# Patient Record
Sex: Female | Born: 1970 | Race: White | Hispanic: No | Marital: Married | Smoking: Former smoker
Health system: Southern US, Community
[De-identification: ages and names within clinical notes are randomized; demographics above are authoritative.]

## PROBLEM LIST (undated history)

## (undated) DIAGNOSIS — K633 Ulcer of intestine: Secondary | ICD-10-CM

## (undated) DIAGNOSIS — M545 Low back pain, unspecified: Secondary | ICD-10-CM

## (undated) DIAGNOSIS — R0609 Other forms of dyspnea: Secondary | ICD-10-CM

## (undated) DIAGNOSIS — D751 Secondary polycythemia: Secondary | ICD-10-CM

## (undated) DIAGNOSIS — J383 Other diseases of vocal cords: Secondary | ICD-10-CM

## (undated) DIAGNOSIS — K4090 Unilateral inguinal hernia, without obstruction or gangrene, not specified as recurrent: Secondary | ICD-10-CM

## (undated) DIAGNOSIS — I10 Essential (primary) hypertension: Secondary | ICD-10-CM

## (undated) DIAGNOSIS — J45909 Unspecified asthma, uncomplicated: Secondary | ICD-10-CM

## (undated) DIAGNOSIS — R0600 Dyspnea, unspecified: Secondary | ICD-10-CM

## (undated) DIAGNOSIS — K5790 Diverticulosis of intestine, part unspecified, without perforation or abscess without bleeding: Secondary | ICD-10-CM

## (undated) DIAGNOSIS — J439 Emphysema, unspecified: Secondary | ICD-10-CM

## (undated) DIAGNOSIS — J189 Pneumonia, unspecified organism: Secondary | ICD-10-CM

## (undated) DIAGNOSIS — R05 Cough: Secondary | ICD-10-CM

## (undated) DIAGNOSIS — K7689 Other specified diseases of liver: Secondary | ICD-10-CM

## (undated) DIAGNOSIS — Z8719 Personal history of other diseases of the digestive system: Secondary | ICD-10-CM

## (undated) DIAGNOSIS — C44300 Unspecified malignant neoplasm of skin of unspecified part of face: Secondary | ICD-10-CM

## (undated) DIAGNOSIS — F319 Bipolar disorder, unspecified: Secondary | ICD-10-CM

## (undated) DIAGNOSIS — R059 Cough, unspecified: Secondary | ICD-10-CM

## (undated) DIAGNOSIS — I509 Heart failure, unspecified: Secondary | ICD-10-CM

## (undated) DIAGNOSIS — R062 Wheezing: Secondary | ICD-10-CM

## (undated) DIAGNOSIS — K219 Gastro-esophageal reflux disease without esophagitis: Secondary | ICD-10-CM

## (undated) DIAGNOSIS — J42 Unspecified chronic bronchitis: Secondary | ICD-10-CM

## (undated) DIAGNOSIS — G8929 Other chronic pain: Secondary | ICD-10-CM

## (undated) HISTORY — DX: Cough, unspecified: R05.9

## (undated) HISTORY — DX: Emphysema, unspecified: J43.9

## (undated) HISTORY — DX: Unspecified asthma, uncomplicated: J45.909

## (undated) HISTORY — PX: SKIN CANCER EXCISION: SHX779

## (undated) HISTORY — DX: Cough: R05

## (undated) HISTORY — PX: BACK SURGERY: SHX140

## (undated) HISTORY — DX: Wheezing: R06.2

---

## 1999-04-30 HISTORY — PX: APPENDECTOMY: SHX54

## 2004-12-09 ENCOUNTER — Ambulatory Visit: Payer: Self-pay | Admitting: Family Medicine

## 2004-12-21 ENCOUNTER — Ambulatory Visit: Payer: Self-pay | Admitting: Family Medicine

## 2005-01-04 ENCOUNTER — Ambulatory Visit: Payer: Self-pay | Admitting: Family Medicine

## 2005-01-21 ENCOUNTER — Ambulatory Visit: Payer: Self-pay | Admitting: Family Medicine

## 2005-04-21 ENCOUNTER — Ambulatory Visit: Payer: Self-pay | Admitting: Family Medicine

## 2005-06-21 ENCOUNTER — Ambulatory Visit (HOSPITAL_COMMUNITY): Admission: RE | Admit: 2005-06-21 | Discharge: 2005-06-21 | Payer: Self-pay | Admitting: Family Medicine

## 2005-06-21 ENCOUNTER — Ambulatory Visit: Payer: Self-pay | Admitting: Family Medicine

## 2005-06-22 ENCOUNTER — Ambulatory Visit: Payer: Self-pay | Admitting: Family Medicine

## 2005-08-11 ENCOUNTER — Ambulatory Visit: Payer: Self-pay | Admitting: Family Medicine

## 2005-08-29 DIAGNOSIS — J189 Pneumonia, unspecified organism: Secondary | ICD-10-CM

## 2005-08-29 HISTORY — DX: Pneumonia, unspecified organism: J18.9

## 2005-09-01 ENCOUNTER — Encounter: Admission: RE | Admit: 2005-09-01 | Discharge: 2005-09-01 | Payer: Self-pay | Admitting: Orthopedic Surgery

## 2005-09-27 ENCOUNTER — Ambulatory Visit: Payer: Self-pay | Admitting: Family Medicine

## 2005-09-28 ENCOUNTER — Ambulatory Visit (HOSPITAL_COMMUNITY): Admission: RE | Admit: 2005-09-28 | Discharge: 2005-09-28 | Payer: Self-pay | Admitting: Family Medicine

## 2005-09-30 ENCOUNTER — Inpatient Hospital Stay (HOSPITAL_COMMUNITY): Admission: EM | Admit: 2005-09-30 | Discharge: 2005-10-04 | Payer: Self-pay | Admitting: Emergency Medicine

## 2005-09-30 ENCOUNTER — Ambulatory Visit: Payer: Self-pay | Admitting: Family Medicine

## 2005-10-10 ENCOUNTER — Ambulatory Visit: Payer: Self-pay | Admitting: Family Medicine

## 2005-10-17 ENCOUNTER — Ambulatory Visit: Payer: Self-pay | Admitting: Family Medicine

## 2005-11-01 ENCOUNTER — Ambulatory Visit: Payer: Self-pay | Admitting: Family Medicine

## 2005-12-13 ENCOUNTER — Ambulatory Visit: Payer: Self-pay | Admitting: Family Medicine

## 2005-12-30 ENCOUNTER — Ambulatory Visit: Payer: Self-pay | Admitting: Family Medicine

## 2006-01-12 ENCOUNTER — Ambulatory Visit: Payer: Self-pay | Admitting: Family Medicine

## 2006-10-03 ENCOUNTER — Inpatient Hospital Stay (HOSPITAL_COMMUNITY): Admission: RE | Admit: 2006-10-03 | Discharge: 2006-10-05 | Payer: Self-pay | Admitting: Obstetrics and Gynecology

## 2006-10-03 ENCOUNTER — Encounter (INDEPENDENT_AMBULATORY_CARE_PROVIDER_SITE_OTHER): Payer: Self-pay | Admitting: *Deleted

## 2006-10-28 HISTORY — PX: ABDOMINAL HYSTERECTOMY: SHX81

## 2007-05-30 HISTORY — PX: CHOLECYSTECTOMY: SHX55

## 2007-08-01 ENCOUNTER — Emergency Department (HOSPITAL_COMMUNITY): Admission: EM | Admit: 2007-08-01 | Discharge: 2007-08-02 | Payer: Self-pay | Admitting: Emergency Medicine

## 2007-08-02 ENCOUNTER — Encounter: Payer: Self-pay | Admitting: Internal Medicine

## 2007-08-06 ENCOUNTER — Encounter: Payer: Self-pay | Admitting: Internal Medicine

## 2007-08-06 ENCOUNTER — Emergency Department (HOSPITAL_COMMUNITY): Admission: EM | Admit: 2007-08-06 | Discharge: 2007-08-06 | Payer: Self-pay | Admitting: Emergency Medicine

## 2008-03-29 HISTORY — PX: HERNIA REPAIR: SHX51

## 2009-03-08 ENCOUNTER — Emergency Department (HOSPITAL_COMMUNITY): Admission: EM | Admit: 2009-03-08 | Discharge: 2009-03-08 | Payer: Self-pay | Admitting: Emergency Medicine

## 2009-05-13 ENCOUNTER — Emergency Department (HOSPITAL_COMMUNITY): Admission: EM | Admit: 2009-05-13 | Discharge: 2009-05-13 | Payer: Self-pay | Admitting: Emergency Medicine

## 2009-08-05 ENCOUNTER — Ambulatory Visit (HOSPITAL_COMMUNITY): Admission: RE | Admit: 2009-08-05 | Discharge: 2009-08-05 | Payer: Self-pay | Admitting: Family Medicine

## 2009-08-05 ENCOUNTER — Encounter: Payer: Self-pay | Admitting: Internal Medicine

## 2009-08-27 ENCOUNTER — Ambulatory Visit (HOSPITAL_COMMUNITY): Admission: RE | Admit: 2009-08-27 | Discharge: 2009-08-27 | Payer: Self-pay | Admitting: Family Medicine

## 2009-08-27 ENCOUNTER — Encounter: Payer: Self-pay | Admitting: Internal Medicine

## 2009-09-21 ENCOUNTER — Encounter (INDEPENDENT_AMBULATORY_CARE_PROVIDER_SITE_OTHER): Payer: Self-pay | Admitting: *Deleted

## 2009-10-08 ENCOUNTER — Encounter (INDEPENDENT_AMBULATORY_CARE_PROVIDER_SITE_OTHER): Payer: Self-pay | Admitting: *Deleted

## 2009-11-04 ENCOUNTER — Telehealth: Payer: Self-pay | Admitting: Internal Medicine

## 2009-11-05 ENCOUNTER — Ambulatory Visit (HOSPITAL_COMMUNITY): Admission: RE | Admit: 2009-11-05 | Discharge: 2009-11-05 | Payer: Self-pay | Admitting: Family Medicine

## 2009-12-07 ENCOUNTER — Emergency Department (HOSPITAL_COMMUNITY)
Admission: EM | Admit: 2009-12-07 | Discharge: 2009-12-07 | Payer: Self-pay | Source: Home / Self Care | Admitting: Emergency Medicine

## 2009-12-10 ENCOUNTER — Encounter: Payer: Self-pay | Admitting: Internal Medicine

## 2009-12-11 ENCOUNTER — Telehealth (INDEPENDENT_AMBULATORY_CARE_PROVIDER_SITE_OTHER): Payer: Self-pay | Admitting: *Deleted

## 2009-12-17 ENCOUNTER — Encounter: Payer: Self-pay | Admitting: Pulmonary Disease

## 2010-01-08 ENCOUNTER — Telehealth (INDEPENDENT_AMBULATORY_CARE_PROVIDER_SITE_OTHER): Payer: Self-pay | Admitting: *Deleted

## 2010-01-11 ENCOUNTER — Telehealth (INDEPENDENT_AMBULATORY_CARE_PROVIDER_SITE_OTHER): Payer: Self-pay | Admitting: *Deleted

## 2010-01-29 ENCOUNTER — Ambulatory Visit: Payer: Self-pay | Admitting: Pulmonary Disease

## 2010-01-29 DIAGNOSIS — I10 Essential (primary) hypertension: Secondary | ICD-10-CM | POA: Insufficient documentation

## 2010-01-29 DIAGNOSIS — Z8659 Personal history of other mental and behavioral disorders: Secondary | ICD-10-CM

## 2010-01-29 DIAGNOSIS — J449 Chronic obstructive pulmonary disease, unspecified: Secondary | ICD-10-CM

## 2010-01-29 DIAGNOSIS — E119 Type 2 diabetes mellitus without complications: Secondary | ICD-10-CM

## 2010-02-23 ENCOUNTER — Telehealth (INDEPENDENT_AMBULATORY_CARE_PROVIDER_SITE_OTHER): Payer: Self-pay | Admitting: *Deleted

## 2010-02-25 ENCOUNTER — Ambulatory Visit: Payer: Self-pay | Admitting: Pulmonary Disease

## 2010-02-26 ENCOUNTER — Telehealth: Payer: Self-pay | Admitting: Pulmonary Disease

## 2010-05-19 ENCOUNTER — Ambulatory Visit (HOSPITAL_COMMUNITY)
Admission: RE | Admit: 2010-05-19 | Discharge: 2010-05-19 | Payer: Self-pay | Source: Home / Self Care | Admitting: Internal Medicine

## 2010-08-29 HISTORY — PX: BILATERAL OOPHORECTOMY: SHX1221

## 2010-09-01 ENCOUNTER — Inpatient Hospital Stay (HOSPITAL_COMMUNITY)
Admission: RE | Admit: 2010-09-01 | Discharge: 2010-09-03 | Payer: Self-pay | Source: Home / Self Care | Attending: Obstetrics and Gynecology | Admitting: Obstetrics and Gynecology

## 2010-09-01 ENCOUNTER — Encounter (HOSPITAL_COMMUNITY): Payer: Self-pay | Admitting: Obstetrics and Gynecology

## 2010-09-01 LAB — TYPE AND SCREEN
ABO/RH(D): A POS
Antibody Screen: NEGATIVE

## 2010-09-01 LAB — GLUCOSE, CAPILLARY
Glucose-Capillary: 116 mg/dL — ABNORMAL HIGH (ref 70–99)
Glucose-Capillary: 157 mg/dL — ABNORMAL HIGH (ref 70–99)
Glucose-Capillary: 157 mg/dL — ABNORMAL HIGH (ref 70–99)
Glucose-Capillary: 141 mg/dL — ABNORMAL HIGH (ref 70–99)

## 2010-09-02 LAB — CBC
HCT: 40.3 % (ref 36.0–46.0)
Hemoglobin: 12.8 g/dL (ref 12.0–15.0)
MCH: 29.7 pg (ref 26.0–34.0)
MCHC: 31.8 g/dL (ref 30.0–36.0)
MCV: 93.5 fL (ref 78.0–100.0)
Platelets: 370 10*3/uL (ref 150–400)
RBC: 4.31 MIL/uL (ref 3.87–5.11)
RDW: 13.7 % (ref 11.5–15.5)
WBC: 14.7 10*3/uL — ABNORMAL HIGH (ref 4.0–10.5)

## 2010-09-02 LAB — COMPREHENSIVE METABOLIC PANEL
ALT: 10 U/L (ref 0–35)
AST: 13 U/L (ref 0–37)
Albumin: 3.2 g/dL — ABNORMAL LOW (ref 3.5–5.2)
Alkaline Phosphatase: 35 U/L — ABNORMAL LOW (ref 39–117)
BUN: 8 mg/dL (ref 6–23)
CO2: 31 mEq/L (ref 19–32)
Calcium: 8.9 mg/dL (ref 8.4–10.5)
Chloride: 107 mEq/L (ref 96–112)
Creatinine, Ser: 0.61 mg/dL (ref 0.4–1.2)
GFR calc Af Amer: >60 mL/min (ref >60)
GFR calc non Af Amer: >60 mL/min (ref >60)
Glucose, Bld: 104 mg/dL — ABNORMAL HIGH (ref 70–99)
Potassium: 4.9 mEq/L (ref 3.5–5.1)
Sodium: 140 mEq/L (ref 135–145)
Total Bilirubin: 0.7 mg/dL (ref 0.3–1.2)
Total Protein: 5.6 g/dL — ABNORMAL LOW (ref 6.0–8.3)

## 2010-09-02 LAB — GLUCOSE, CAPILLARY
Glucose-Capillary: 94 mg/dL (ref 70–99)
Glucose-Capillary: 111 mg/dL — ABNORMAL HIGH (ref 70–99)
Glucose-Capillary: 98 mg/dL (ref 70–99)

## 2010-09-03 LAB — GLUCOSE, CAPILLARY: Glucose-Capillary: 87 mg/dL (ref 70–99)

## 2010-09-19 ENCOUNTER — Encounter: Payer: Self-pay | Admitting: Family Medicine

## 2010-09-19 ENCOUNTER — Encounter: Payer: Self-pay | Admitting: Internal Medicine

## 2010-09-20 ENCOUNTER — Encounter: Payer: Self-pay | Admitting: Family Medicine

## 2010-09-28 NOTE — Letter (Signed)
Summary: New Patient letter  [facility]  [address] [place], [place] [address]   Phone: [phone]  Fax: [phone]       09/21/2009 MRN: [id number]  [facility] [address] [place], [place]  [address]  Dear Ms. Pinkerton,  Welcome to the Gastroenterology Division at [facility].    You are scheduled to see Dr.  Marina Goodell on 10-08-09 at 10am on the 3rd floor at [facility], [address]  We ask that you try to arrive at our office 15 minutes prior to your appointment time to allow for check-in.  We would like you to complete the enclosed self-administered evaluation form prior to your visit and bring it with you on the day of your appointment.  We will review it with you.  Also, please bring a complete list of all your medications or, if you prefer, bring the medication bottles and we will list them.  Please bring your insurance card so that we may make a copy of it.  If your insurance requires a referral to see a specialist, please bring your referral form from your primary care physician.  Co-payments are due at the time of your visit and may be paid by cash, check or credit card.     Your office visit will consist of a consult with your physician (includes a physical exam), any laboratory testing he/she may order, scheduling of any necessary diagnostic testing (e.g. x-ray, ultrasound, CT-scan), and scheduling of a procedure (e.g. Endoscopy, Colonoscopy) if required.  Please allow enough time on your schedule to allow for any/all of these possibilities.    If you cannot keep your appointment, please call [phone] to cancel or reschedule prior to your appointment date.  This allows [place] the opportunity to schedule an appointment for another patient in need of care.  If you do not cancel or reschedule by 5 p.m. the business day prior to your appointment date, you will be charged a $50.00 late cancellation/no-show fee.    Thank you for choosing [facility] for your medical needs.  We appreciate the opportunity to care for you.  Please visit [place] at our website  to learn more about our practice.                     Sincerely,                                                             The Gastroenterology Division

## 2010-09-28 NOTE — Progress Notes (Signed)
Summary: R/S appt. today  ---- Converted from flag ---- ---- 11/04/2009 9:21 AM, Hilarie Fredrickson MD wrote: convert this to phone note so that it is part of the record  ---- 11/04/2009 8:21 AM, Milford Cage NCMA wrote:   ---- 11/04/2009 8:20 AM, Karna Christmas wrote: Pt r/s until 22-Dec-2009 b/c of death in family ------------------------------  Phone Note Call from Patient   Caller: Patient Action Taken: Appt Scheduled Today Summary of Call: R/S appt. for today-death in family.

## 2010-09-28 NOTE — Letter (Signed)
Summary: Discharge Letter  [facility]  [address] [place], [place] [address]   Phone: [phone]  Fax: [phone]       12/10/2009 MRN: [id number]  [facility] [address] [place], [place]  [address]  Dear Ms. Zentner,   Unfortunately,I find it necessary to inform you that I will not be able to provide medical care to you, because you have had multiple same day cancellations as well as a no show on 12-10-2009 with [facility]. Needless to say, this behavior creates an unnecessary burden of work our Programme researcher, broadcasting/film/video as well as unfairness to other patients who may have wished to be seen in our office.  Since your condition requires medical attention, I suggest that you place yourself under the care of another physician without delay. If you desire, I will be available for emergency care only for 30 days after you receive this letter.  This should give you ample time to select a physician of your choice from the many competent providers in this area. You may want to call the local medical society or [facility]'s physician referral service [phone]) for their assistance in locating a new physician. With your written authorization, I will make a copy of your medical record available to your new physician.   Sincerely,    Wilhemina Bonito. Marina Goodell M.D. [facility]

## 2010-09-28 NOTE — Progress Notes (Signed)
Summary: Dismissal Letter Mailed 1st Class Mail  Letter undeliverable. Resent by first class mail. [name]  Jan 11, 2010 4:11 PM

## 2010-09-28 NOTE — Progress Notes (Signed)
Summary: Certified Letter Returned   Letter undeliverable. Resent by first class mail. [name]  Jan 08, 2010 8:43 AM

## 2010-09-28 NOTE — Letter (Signed)
Summary: New Patient letter  [facility]  [address] [place], [place] [address]   Phone: [phone]  Fax: [phone]       10/08/2009 MRN: [id number]  [facility] [address] [place], [place]  [address]  Dear Ms. Monter,  Welcome to the Gastroenterology Division at [facility].    You are scheduled to see Dr. Marina Goodell on 11-04-09 at 9:30a.m. on the 3rd floor at [facility], [address].  We ask that you try to arrive at our office 15 minutes prior to your appointment time to allow for check-in.  We would like you to complete the enclosed self-administered evaluation form prior to your visit and bring it with you on the day of your appointment.  We will review it with you.  Also, please bring a complete list of all your medications or, if you prefer, bring the medication bottles and we will list them.  Please bring your insurance card so that we may make a copy of it.  If your insurance requires a referral to see a specialist, please bring your referral form from your primary care physician.  Co-payments are due at the time of your visit and may be paid by cash, check or credit card.     Your office visit will consist of a consult with your physician (includes a physical exam), any laboratory testing he/she may order, scheduling of any necessary diagnostic testing (e.g. x-ray, ultrasound, CT-scan), and scheduling of a procedure (e.g. Endoscopy, Colonoscopy) if required.  Please allow enough time on your schedule to allow for any/all of these possibilities.    If you cannot keep your appointment, please call [phone] to cancel or reschedule prior to your appointment date.  This allows [place] the opportunity to schedule an appointment for another patient in need of care.  If you do not cancel or reschedule by 5 p.m. the business day prior to your appointment date, you will be charged a $50.00 late cancellation/no-show fee.    Thank you for choosing [facility] for your medical needs.  We appreciate the opportunity to care for you.  Please visit [place] at our website  to learn more about our practice.                     Sincerely,                                                             The Gastroenterology Division

## 2010-09-28 NOTE — Progress Notes (Signed)
  Phone Note Other Incoming   Request: Send information Summary of Call: Records received from [facility].  32 pages forwarded to [facility] for Dr. Shelle Iron to review.      Appended Document:  received records and put in [facility] very important look at folder for him to review.    Appended Document:  all of the records are related to psychiatric evaluations and admissions.  NO pulmonary records available.  Will neet AAT level next visit.

## 2010-09-28 NOTE — Assessment & Plan Note (Signed)
Summary: consult for copd   Copy to:  Silas Sacramento, FNP/ Dwana Melena Primary Provider/Referring Provider:  Silas Sacramento, FNP at [facility], MD  CC:  Pulmonary Consult.  History of Present Illness: the patient is a 40 year old female who I've been asked to see for chronic obstructive pulmonary disease. The patient apparently has been diagnosed with obstructive disease at [facility] many years ago, and has seen a significant worsening of her breathing over the last few years. She has a long history of smoking, and fortunately continues to smoke up to 2 packs of cigarettes a day. She currently is on Spiriva, DuoNeb neb 4 times a day, and also Brovana twice a day. She tells me that she uses her rescue inhaler constantly. The patient last had an episode of acute exacerbation 2 months ago. She was treated with antibiotics and prednisone, and despite this she continues to have shortness of breath and oxygen saturations below her usual baseline. She was treated with a number course of prednisone, and feels that she has gotten a little better. She describes dyspnea on exertion just walking through her house, as well as bringing groceries in from the car. She has a chronic cough with intermittent production of white mucus, and also has intermittent lower extremity edema. She has had a recent chest x-ray that apparently showed no acute process.  Preventive Screening-Counseling & Management  Alcohol-Tobacco     Smoking Status: current  Current Medications (verified): 1)  Percocet 5-325 Mg Tabs (Oxycodone-Acetaminophen) .Marland Kitchen.. 1 Tab By Mouth Every 4 To 6 Hours As Needed 2)  Promethazine Hcl 25 Mg Tabs (Promethazine Hcl) .... Take 1 Tablet By Mouth Three Times A Day 3)  Combivent 18-103 Mcg/act Aero (Ipratropium-Albuterol) .... 2 Puffs Four Times A Day As Needed 4)  Spiriva Handihaler 18 Mcg Caps (Tiotropium Bromide Monohydrate) .... Inhale Contents of 1 Capsule Once A Day 5)  Hydrochlorothiazide 25  Mg Tabs (Hydrochlorothiazide) .... Take 1 Tablet By Mouth Once A Day 6)  Metformin Hcl 500 Mg Tabs (Metformin Hcl) .... Take 1 Tablet By Mouth Two Times A Day 7)  Benicar 40 Mg Tabs (Olmesartan Medoxomil) .... Take 1 Tablet By Mouth Once A Day 8)  Duoneb 0.5-2.5 (3) Mg/56ml Soln (Ipratropium-Albuterol) .Marland Kitchen.. 1 Vial in Nebulizer Two Times A Day 9)  Brovana 15 Mcg/79ml Nebu (Arformoterol Tartrate) .Marland Kitchen.. 1 Vial in Nebulizer Two Times A Day 10)  Ventolin Hfa 108 (90 Base) Mcg/act  Aers (Albuterol Sulfate) .Marland Kitchen.. 1-2 Puffs Every 4-6 Hours As Needed  Allergies (verified): No Known Drug Allergies  Past History:  Past Medical History:  BIPOLAR AFFECTIVE DISORDER, HX OF (ICD-V11.8) HYPERTENSION (ICD-401.9) DIABETES MELLITUS (ICD-250.00)    Past Surgical History: hernia repair x 2 gallbladder 2008 lung biopsies x 2 at [facility] for unknown reasons hysterectomy 2008 appendectomy 2009  Family History: Reviewed history and no changes required. emphysema: maternal grandfather, paternal grandfather asthma: maternal grandfather, paternal grandfather cancer: mother (skin, colon)   Social History: Reviewed history and no changes required. Patient is a current smoker.   started at age 1.  1 1/2 to 2 ppd.  pt has children. pt lives with Thurnell Lose. Pt is currently on SS Disability.  Smoking Status:  current  Review of Systems       The patient complains of shortness of breath with activity, shortness of breath at rest, productive cough, non-productive cough, chest pain, abdominal pain, sore throat, and change in color of mucus.  The patient denies coughing up blood, irregular  heartbeats, acid heartburn, indigestion, loss of appetite, weight change, difficulty swallowing, tooth/dental problems, headaches, nasal congestion/difficulty breathing through nose, sneezing, itching, ear ache, anxiety, depression, hand/feet swelling, joint stiffness or pain, rash, and fever.    Vital Signs:  Patient profile:    40 year old female Height:      65 inches Weight:      195 pounds BMI:     32.57 O2 Sat:      93 % on Room air Temp:     98.0 degrees F oral Pulse rate:   89 / minute Cuff size:   regular  Vitals Entered By: Arman Filter LPN (January 29, 1609 3:24 PM)  O2 Flow:  Room air CC: Pulmonary Consult Comments Medications reviewed with patient Arman Filter LPN  January 30, 9603 3:24 PM     Physical Exam  General:  wd female in nad Eyes:  PERRLA and EOMI.   Nose:  patent without discharge Mouth:  mild thrush, inflammation posteriorly no lesions seen Neck:  no jvd, LN ?thyroid enlargement? Lungs:  decreased bs, but surprisingly fairly clear no wheezing or rhonchi Heart:  rrr, no mrg Abdomen:  soft and nontender, bs+ Extremities:  no significant edema or cyanosis pulses intact distally Neurologic:  alert and oriented, moves all 4.   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 I think she has at least chronic asthmatic bronchitis related to severe airway inflammation from her ongoing smoking. I would like to simplify her bronchodilator regimen, but also add inhaled corticosteroids in light of her frequent exacerbations. However, I have told her there is no medication available that will make her better if she continues to smoke to this degree. I would like to check full pulmonary function studies at the next visit after she has been on inhaled steroids, and have stressed to her the importance of total smoking cessation.  Medications Added to Medication List This Visit: 1)  Percocet 5-325 Mg Tabs (Oxycodone-acetaminophen) .Marland Kitchen.. 1 tab by mouth every 4 to 6 hours as needed 2)  Promethazine Hcl 25 Mg Tabs (Promethazine hcl) .... Take 1 tablet by mouth three times a day 3)  Combivent 18-103 Mcg/act Aero  (Ipratropium-albuterol) .... 2 puffs four times a day as needed 4)  Spiriva Handihaler 18 Mcg Caps (Tiotropium bromide monohydrate) .... Inhale contents of 1 capsule once a day 5)  Hydrochlorothiazide 25 Mg Tabs (Hydrochlorothiazide) .... Take 1 tablet by mouth once a day 6)  Metformin Hcl 500 Mg Tabs (Metformin hcl) .... Take 1 tablet by mouth two times a day 7)  Benicar 40 Mg Tabs (Olmesartan medoxomil) .... Take 1 tablet by mouth once a day 8)  Duoneb 0.5-2.5 (3) Mg/24ml Soln (Ipratropium-albuterol) .Marland Kitchen.. 1 vial in nebulizer two times a day 9)  Brovana 15 Mcg/77ml Nebu (Arformoterol tartrate) .Marland Kitchen.. 1 vial in nebulizer two times a day 10)  Ventolin Hfa 108 (90 Base) Mcg/act Aers (Albuterol sulfate) .Marland Kitchen.. 1-2 puffs every 4-6 hours as needed 11)  Pulmicort 0.5 Mg/9ml Susp (Budesonide) .... One in nebulizer am and pm  Other Orders: Consultation Level IV (54098) Pulmonary Referral (Pulmonary) Tobacco use cessation intermediate 3-10 minutes (11914)  Patient Instructions: 1)  stay on spiriva one inhalation once a day 2)  will change duoneb to albuterol by nebulizer 4-6 times a day, but at least 4 times everyday.  3)  add budesonide 0.5mg  to two of the albuterol treatments each day. 4)  stop brovana 5)  will setup for breathing studies in 4 weeks and see you back on same day. 6)  stop smoking.  Prescriptions: PULMICORT 0.5 MG/2ML SUSP (BUDESONIDE) one in nebulizer am and pm  #60 x 6   Entered and Authorized by:   Barbaraann Share MD   Signed by:   Barbaraann Share MD on 01/29/2010   Method used:   Print then Give to Patient   RxID:   [id number]

## 2010-09-28 NOTE — Miscellaneous (Signed)
Summary: Orders Update  Clinical Lists Changes  Orders: Added new Service order of No Show NS50 (NS50) - Signed 

## 2010-09-28 NOTE — Progress Notes (Signed)
Summary: Dismissal Letter Mailed Certified  Dismissal Letter sent by certified mail. [name]  December 11, 2009 9:29 AM

## 2010-09-28 NOTE — Progress Notes (Signed)
Summary: nos appt  Phone Note Call from Patient   Caller: [email or web address]  Call For: Yanky Vanderburg Summary of Call: LMTCB x2 to rsc nos from 6/30. Initial call taken by: Darletta Moll,  February 26, 2010 2:41 PM

## 2010-11-08 LAB — CBC
Hemoglobin: 15.7 g/dL — ABNORMAL HIGH (ref 12.0–15.0)
MCH: 30 pg (ref 26.0–34.0)
MCHC: 32.3 g/dL (ref 30.0–36.0)
RDW: 13.6 % (ref 11.5–15.5)

## 2010-11-08 LAB — SURGICAL PCR SCREEN
MRSA, PCR: NEGATIVE
Staphylococcus aureus: NEGATIVE

## 2010-11-08 LAB — BASIC METABOLIC PANEL
CO2: 28 mEq/L (ref 19–32)
Calcium: 9.1 mg/dL (ref 8.4–10.5)
Creatinine, Ser: 0.66 mg/dL (ref 0.4–1.2)
GFR calc Af Amer: >60 mL/min (ref >60)
GFR calc non Af Amer: >60 mL/min (ref >60)

## 2010-11-09 NOTE — Letter (Signed)
Summary: Dismissal Activation Form, Return Receipt  Dismissal Activation Form, Return Receipt   Imported By: Maryln Gottron 11/04/2010 14:28:04  _____________________________________________________________________  External Attachment:    Type:   Image     Comment:   External Document

## 2010-12-05 LAB — DIFFERENTIAL
Basophils Relative: 0 % (ref 0–1)
Lymphocytes Relative: 4 % — ABNORMAL LOW (ref 12–46)
Monocytes Relative: 2 % — ABNORMAL LOW (ref 3–12)
Neutro Abs: 23.8 10*3/uL — ABNORMAL HIGH (ref 1.7–7.7)

## 2010-12-05 LAB — COMPREHENSIVE METABOLIC PANEL
Albumin: 3.4 g/dL — ABNORMAL LOW (ref 3.5–5.2)
BUN: 23 mg/dL (ref 6–23)
Creatinine, Ser: 1.23 mg/dL — ABNORMAL HIGH (ref 0.4–1.2)
GFR calc Af Amer: 59 mL/min — ABNORMAL LOW (ref >60)
Potassium: 4.1 mEq/L (ref 3.5–5.1)
Total Protein: 6.5 g/dL (ref 6.0–8.3)

## 2010-12-05 LAB — CBC
HCT: 41.8 % (ref 36.0–46.0)
MCV: 91.8 fL (ref 78.0–100.0)
Platelets: 475 10*3/uL — ABNORMAL HIGH (ref 150–400)
RDW: 14.6 % (ref 11.5–15.5)

## 2010-12-05 LAB — RAPID URINE DRUG SCREEN, HOSP PERFORMED
Amphetamines: NOT DETECTED
Benzodiazepines: POSITIVE — AB
Cocaine: NOT DETECTED
Opiates: NOT DETECTED
Tetrahydrocannabinol: NOT DETECTED

## 2010-12-05 LAB — ETHANOL: Alcohol, Ethyl (B): <5 mg/dL (ref 0–10)

## 2011-01-14 NOTE — Discharge Summary (Signed)
Brenda Frank, Brenda Frank                 ACCOUNT NO.:  [id number]   MEDICAL RECORD NO.:  [id number]          PATIENT TYPE:  INP   LOCATION:  3027                         FACILITY:  [facility]   PHYSICIAN:  Rolm Gala, M.D.    DATE OF BIRTH:  1971/03/27   DATE OF ADMISSION:  09/30/2005  DATE OF DISCHARGE:  10/04/2005                                 DISCHARGE SUMMARY   DISCHARGE DIAGNOSES:  1.  Chronic obstructive pulmonary disease exacerbation.  2.  Pneumonia.  3.  Bipolar.  4.  Hypertension.  5.  Obesity.  6.  Tobacco abuse.  7.  Prediabetes.   CONSULTING PHYSICIANS:  None.   PROCEDURES:  None.   PRIMARY CARE PHYSICIAN:  [facility], [address] Dr.  Lennox Grumbles.   LABORATORY DATA:  The patient's white blood cells on admission 15.6, H&H  14.9/45.7, platelets 472, 87% PMNs, sodium 136, potassium 4.2, chloride 99,  bicarb 30, BUN 11, creatinine 0.7, glucose 121, AST 20, ALT 34, alk phos 86,  T-bili 0.5.  An ABG: 7.352/66/36.9/55.  A chest x-ray showed left lingular  pneumonia.  HBA1C on February 5 was 6.1.   HOSPITAL COURSE:  This is a 40 year old female, who came in with pneumonia  and likely a COPD exacerbation.  Problem #1.  COPD exacerbation.  The patient does not have a history of  COPD, but her chest x-ray looks like increased air space.  She has a long  smoking history and decreased breath sounds throughout.  The patient was  started on prednisone initially 60, but that was decreased to 40 and she was  discharged on a five-day course of prednisone.  The patient was also started  on Atrovent and albuterol here, but was discharged on her home DuoNeb.  Problem #2.  Pneumonia.  The patient came in and had been on azithromycin  that was continued here.  Her O2 saturation:  She remained on 2 L nasal  cannula at 91 to 95% off the nasal cannula.  She was initially 83%, but then  she came up to 86 and at discharge was walking around to bathroom and around  the  hallways without oxygen and not complaining of any shortness of breath.  Problem #3.  Bipolar.  The patient was continued on her home dose of Geodon,  lithium and Zoloft.  Problem #4.  Hypertension.  The patient came in on 5 mg of lisinopril and  this was increased to 10 mg due to elevated blood pressures.  Problem #5.  Tobacco abuse.  The patient was put on a nicotine patch and had  been cigarette free for five days.  She said she will try to continue this  path.  She received a smoking cessation consult.  Problem #6.  Obesity.  I advised the patient that it would be important to  her overall health to lose weight.  Problem #7.  Pre-diabetes.  The patient had a Hb A1c elevated at 6.1.  She  was referred to an outpatient diabetic education.  She was started on  Glucophage 500 p.o. b.i.d. a.c. and  she was advised that she is in the pre-  diabetic state and that if she did not lose weight and start exercising then  she would likely quickly progress to a full blown diabetic.  The patient  said she was aware that she may be pre-diabetic and had been concerned about  that and she had bought an elliptical machine to work towards losing weight.   DISCHARGE MEDICATIONS:  1.  Zoloft 100 mg daily.  2.  Advair 500/50 one puff b.i.d.  3.  Geodon 20 mg q.a.m. and 40 mg q.p.m.  4.  Lithium 600 mg p.o. b.i.d.  5.  DuoNeb inhale four times daily.  6.  Prednisone 40 mg p.o. daily for two more days.  7.  Lisinopril 10 mg daily.  8.  Azithromycin 250 mg daily until February 8.  9.  Glucophage 500 mg p.o. twice daily with meals.   The patient was a full code during her hospital stay.      Rolm Gala, M.D.     Brenda Frank  D:  10/04/2005  T:  10/04/2005  Job:  [id number]   cc:   Dr. [facility]  [address]  [place], [place] [address]  [phone]

## 2011-01-14 NOTE — H&P (Signed)
NAME:  Brenda Frank, Brenda Frank NO.:  [id number]   MEDICAL RECORD NO.:  [id number]          PATIENT TYPE:  INP   LOCATION:  3027                         FACILITY:  [facility]   PHYSICIAN:  Pearlean Brownie, M.D.DATE OF BIRTH:  May 01, 1971   DATE OF ADMISSION:  09/30/2005  DATE OF DISCHARGE:                                HISTORY & PHYSICAL   PRIMARY CARE PHYSICIAN:  Unassigned.   CHIEF COMPLAINT:  Cough and fever.   HISTORY OF PRESENT ILLNESS:  The patient is a 40 year old morbidly obese  white female with a history of COPD that is moderate in severity, who  developed a fever to 101, increased respiratory rate, cough for the last  three days.  She was seen by her PCP, Dr. Lysbeth Galas, where the chest x-ray was  ordered and showed a left lingular pneumonia.  The patient was started on  Zithromax; however, the patient began vomiting yesterday and was unable to  keep down her p.o. antibiotics.  Began wheezing with some dyspnea, even at  rest.  SpO2 was 70% at her PCP's office and was referred to the ED.  In the  emergency room, the patient was 90% on 3 liters; however, 75% on room air  while sitting in bed and talking.  She gets very sleepy off O2.   Denies any chest pain, pleuritic pain, unilateral lower extremity edema,  recent immobilization, recent fracture, or history of cancer.   PAST MEDICAL HISTORY:  1.  COPD.  2.  Bipolar.  3.  Hypertension.  4.  Questionable obesity/hypoventilation syndrome.   MEDICATIONS:  1.  Geodon 20 mg p.o. b.i.d.  2.  Lithium 600 mg p.o. b.i.d.  3.  Zoloft 100 mg p.o. daily.  4.  Advair 500/50 1 puff inhaled b.i.d.  5.  DuoNeb inhaled q.i.d.   ALLERGIES:  CIPRO, which causes a rash.   SOCIAL HISTORY:  The patient smokes approximately two packs per day.  Has no  alcohol.  Is a recovering cocaine addict.   FAMILY HISTORY:  Mom has diabetes mellitus type 2.  Dad is deceased with a  neck injury.  Maternal grandfather has a history of  coronary artery disease  with CABG.   PHYSICAL EXAMINATION:  VITALS:  Temp 97.6, pulse 82, respiratory rate 22,  SpO2 94% on 5 liters with a blood pressure of 165/103.  GENERAL:  Patient is obese in no apparent distress.  Alert and oriented x3.  HEENT:  Atraumatic and normocephalic.  Pupils are equal, round and reactive  to light.  Extraocular movements are intact.  TM's and canals are clear on  examination.  There is no erythema or exudate in the posterior oropharynx.  Mucous membranes are moist.  NECK:  There is no lymphadenopathy in the neck.  No thyromegaly.  CARDIOVASCULAR:  Regular rate and rhythm. No murmurs, rubs or gallops.  PULMONARY:  Tight expiratory wheezing with poor air movement and some  accessory muscle use.  ABDOMEN:  Soft, nontender, nondistended.  Positive bowel sounds.  Limited  secondary to body habitus.  EXTREMITIES:  No clubbing, cyanosis or edema.  LABS:  White count 15.6, hemoglobin 14.9, hematocrit 45.7, platelet count  472, 87% polys.  Sodium 136, potassium 4.2, chloride 99, bicarb 30, BUN 11,  creatinine 0.7, glucose 121, AST 20, ALT 54, alk phos 86, T bili 0.5.  A  venous blood gas shows a pH of 7.35 with a pCO2 of 66 and bicarb of 36.9 and  venous oxygen of 55.   Chest x-ray shows a left lingular pneumonia.   ASSESSMENT/PLAN:  Patient is a 40 year old white female with pneumonia and  chronic obstructive pulmonary disease exacerbation.  Problem 1: Pneumonia:  We will start her on Rocephin 1 gm IV daily and  Zithromax 500 mg IV daily.  On oxygen, maintain her saturations greater than  90%.  Would ideally like to use respiratory fluoroquinolone; however, the  patient has an allergy.  Problem 2:  Chronic obstructive pulmonary disease:  We will start her on  Advair 500/50 b.i.d., prednisone 60 mg p.o. daily, and albuterol q.4h.  scheduled inhale and Atrovent q.6h. scheduled inhale.  Will also keep her on  a continuous pulse ox.  Problem 3:  Hypertension:   Will monitor.  Problem 4:  Will use Lovenox for deep venous thrombosis prophylaxis and  Protonix for ulcer prophylaxis.  Problem 5:  Tobacco:  Will give the patient a 21 mg nicotine patch and  counsel cessation.  Problem 6:  Bipolar:  Will continue her Geodon, lithium, and Zoloft.      Broadus John T. Pamalee Leyden, MD    ______________________________  Pearlean Brownie, M.D.    WTP/MEDQ  D:  09/30/2005  T:  09/30/2005  Job:  [id number]

## 2011-01-14 NOTE — Discharge Summary (Signed)
Brenda Frank, Brenda Frank                 ACCOUNT NO.:  [id number]   MEDICAL RECORD NO.:  [id number]          PATIENT TYPE:  INP   LOCATION:  9315                          FACILITY:  [facility]   PHYSICIAN:  Zelphia Cairo, MD    DATE OF BIRTH:  Sep 04, 1970   DATE OF ADMISSION:  10/03/2006  DATE OF DISCHARGE:  10/05/2006                               DISCHARGE SUMMARY   ADMISSION DIAGNOSIS:  Pelvic pain and menorrhagia.   PROCEDURE:  Total abdominal hysterectomy.   HOSPITAL COURSE:  The patient was admitted to the hospital October 03, 2006, with complaints of chronic pelvic pain and menorrhagia.  She  underwent total abdominal hysterectomy without complications.  Please  see operative report for further details of the surgery.  Her pain was  initially controlled with an IV PCA and foley catheter was in place.  She remained afebrile throughout her hospital course and her hemoglobin  remained stable at 12.4.  As her bowel function returned, her IV was  discontinued and her pain was controlled with medications.  As she began  to ambulate without problems, her catheter was removed.   On postoperative day #2, she was ambulating without difficulty,  tolerating a regular diet and urinating without difficulty.  She was  passing flatus, she was afebrile, her abdomen was soft and nontender  with good bowel sounds.  Her incision was clean, dry and intact.  She  was discharged home in stable condition.   DISCHARGE MEDICATIONS:  She was given a prescription for Percocet.   She will follow up on Monday for staple removal.      Zelphia Cairo, MD  Electronically Signed     GA/MEDQ  D:  10/05/2006  T:  10/05/2006  Job:  [id number]

## 2011-01-14 NOTE — Op Note (Signed)
NAMEMICALA, SALTSMAN                 ACCOUNT NO.:  [id number]   MEDICAL RECORD NO.:  [id number]          PATIENT TYPE:  INP   LOCATION:  9315                          FACILITY:  [facility]   PHYSICIAN:  Zelphia Cairo, MD    DATE OF BIRTH:  [birth year]   DATE OF PROCEDURE:  10/09/2006  DATE OF DISCHARGE:  10/05/2006                               OPERATIVE REPORT   PREOPERATIVE DIAGNOSES:  1. Chronic pelvic pain.  2. Menorrhagia.   POSTOPERATIVE DIAGNOSES:  1. Chronic pelvic pain.  2. Menorrhagia.   PROCEDURE:  Total abdominal hysterectomy.   SURGEON:  Zelphia Cairo, M.D.   ASSISTANT:  Dineen Kid. Rana Snare, M.D.   ESTIMATED BLOOD LOSS:  100 mL.   SPECIMENS:  Uterus and cervix to pathology.   COMPLICATIONS:  None.   CONDITION:  Extubated to recovery room.   PROCEDURE:  The patient was taken to the operating room where she was  placed in the supine position and given general anesthesia.  She was  prepped and draped in the usual sterile fashion and a foley catheter was  inserted.  A Pfannenstiel skin incision was made approximately 2 cm  above the pubic symphysis and extended sharply to the rectus fascia.  The fascia was then incised and extended laterally using Mayo scissors.  The peritoneum was then identified, tented upwards and entered sharply  with Metzenbaum scissors.  The pelvis was examined to find a small  normal-appearing uterus.  Bilateral ovaries and fallopian tubes also  appeared normal.  An O'Connor-O'Sullivan retractor was then placed in  the incision and the bowel was packed away with moist lap sponges.   Two Kelly clamps were placed on the cornea and used for retraction.  The  round ligaments were clamped bilaterally, transected and suture ligated  with 0 Vicryl.  The utero-ovarian ligament was then clamped bilaterally,  transected and doubly ligated with 0 Vicryl.  The anterior leaf of broad  ligament was then incised along the bladder reflection to the midline  from  both sides.  The bladder was then gently dissected off the lower  uterine segment and the cervix using a sponge stick.  The  infundibulopelvic ligaments on both sides were doubly clamped,  transected and suture ligated with 0 Vicryl.  Hemostasis was assured.  The uterine arteries were then skeletonized, clamped with Heaney clamps  and transected, and suture ligated using 0 Vicryl.  The uterosacral  ligaments were then clamped bilaterally, transected and suture ligated.  The cervix and uterus were then amputated with Jorgenson scissors.  The  vaginal cuff angles were closed using figure-of-eight stitches of 0  Vicryl.  The remainder of the vaginal cuff was then closed using  interrupted figure-of-eight sutures of 0 Vicryl.  Hemostasis of the  vaginal cuff was assured.  The pelvis was then copiously irrigated with  warm normal saline.  The vaginal cuff sutures were then tied together  and cut.  Hemostasis was assured.  The laparotomy sponges and retractors  were removed from the abdomen.  The peritoneum was closed.  Next, the  fascia was closed with  a running stitch of 0 PDS.  The subcutaneous  tissue was then irrigated and the skin was closed with staples.  Sponge,  lap, needle and instrument counts were correct x2.  The patient was  taken to the PACU awake and in stable condition.      Zelphia Cairo, MD  Electronically Signed     GA/MEDQ  D:  10/09/2006  T:  10/09/2006  Job:  [id number]

## 2011-01-14 NOTE — H&P (Signed)
NAME:  Brenda Frank, Brenda Frank NO.:  [id number]   MEDICAL RECORD NO.:  [id number]          PATIENT TYPE:  INP   LOCATION:                                FACILITY:  [facility]   PHYSICIAN:  Zelphia Cairo, MD    DATE OF BIRTH:  20-Oct-1970   DATE OF ADMISSION:  10/03/2006  DATE OF DISCHARGE:                              HISTORY & PHYSICAL   /A 40 year old white female with complaints of pelvic pain, prolonged  dyspareunia and back pain.  Over the past few months she has had  increasingly heavy menstrual cycles, lasting anywhere from 12-23 days.  Her menstrual cycles are very heavy with blood clots and pelvic pain.  Occasionally pelvic pain shoots down her legs.  Pelvic ultrasound was  performed showing a normal-appearing uterus and bilateral ovaries.  There was a fibroid measuring 3.1 x 2.5 x 2.8 cm displacing the  endometrium anteriorly. Endometrial stripe measured 0.4 cm.   PAST MEDICAL HISTORY:  1. Diabetes.  2. Bipolar disorder.  3. COPD.   SURGICAL HISTORY:  Negative.   SOCIAL HISTORY:  Positive for tobacco use of 1-1/2 packs per day.  Denies alcohol or drug use.  She is currently single.   ALLERGIES:  None.   MEDICATIONS:  Benicar, Advair, albuterol, lithium, Zoloft, Combivent,  metformin, Singulair and Hydrocodone.   OB HISTORY:  Significant for one termination and one miscarriage, both  uncomplicated.   GYN HISTORY:  Significant for menarche at age 13. She had irregular  bleeding as described above. Denies a history of abnormal Pap smears.   PHYSICAL EXAMINATION:  VITAL SIGNS: Height 5 feet 6 inches.  Weight 287.  Blood pressure 120/78, hemoglobin 14.  HEAD/NECK: Normal.  No thyromegaly or nodularity.  HEART: Regular.  LUNGS: Clear.  BREASTS: Symmetrical without palpable masses or lesions.  ABDOMEN: Obese and nontender.  PELVIC: Exam reveals normal external female genitalia.  Vagina is normal  without lesions.  Urethral meatus is normal.  Cervix is without  masses.  Uterus is mobile and nontender.  No adnexal masses noted.   ASSESSMENT/PLAN:  A 40 year old white female with menorrhagia and  dysmenorrhea.  We discussed options including a Jearld Adjutant IUD versus  ablation versus hysterectomy.  The patient is adamant about hysterectomy  even  though she has not yet had children.  We discussed the risks of regret  and that she would no longer be able to have children.  She is  absolutely sure this is the course she would like to pursue. We will  move forward with scheduling this. We discussed alternatives risks of  surgery.      Zelphia Cairo, MD  Electronically Signed     GA/MEDQ  D:  10/02/2006  T:  10/02/2006  Job:  [id number]

## 2011-01-21 ENCOUNTER — Other Ambulatory Visit (HOSPITAL_COMMUNITY): Payer: Self-pay | Admitting: Internal Medicine

## 2011-01-21 DIAGNOSIS — M545 Low back pain: Secondary | ICD-10-CM

## 2011-01-26 ENCOUNTER — Ambulatory Visit (HOSPITAL_COMMUNITY): Admission: RE | Admit: 2011-01-26 | Payer: Self-pay | Source: Ambulatory Visit

## 2011-01-28 ENCOUNTER — Ambulatory Visit (HOSPITAL_COMMUNITY)
Admission: RE | Admit: 2011-01-28 | Discharge: 2011-01-28 | Disposition: A | Discharge status: Home / Self Care | Payer: 59 | Source: Ambulatory Visit | Attending: Internal Medicine | Admitting: Internal Medicine

## 2011-01-28 DIAGNOSIS — M545 Low back pain, unspecified: Secondary | ICD-10-CM | POA: Insufficient documentation

## 2011-01-28 DIAGNOSIS — M79609 Pain in unspecified limb: Secondary | ICD-10-CM | POA: Insufficient documentation

## 2011-01-28 DIAGNOSIS — M5126 Other intervertebral disc displacement, lumbar region: Secondary | ICD-10-CM | POA: Insufficient documentation

## 2011-06-06 LAB — URINALYSIS, ROUTINE W REFLEX MICROSCOPIC
Bilirubin Urine: NEGATIVE
Glucose, UA: NEGATIVE
Hgb urine dipstick: NEGATIVE
Specific Gravity, Urine: 1.019
Urobilinogen, UA: 0.2

## 2011-06-06 LAB — COMPREHENSIVE METABOLIC PANEL
AST: 67 — ABNORMAL HIGH
Albumin: 3.3 — ABNORMAL LOW
Alkaline Phosphatase: 106
BUN: 2 — ABNORMAL LOW
CO2: 31
Chloride: 100
Chloride: 103
Creatinine, Ser: 0.56
Creatinine, Ser: 0.62
GFR calc Af Amer: >60
GFR calc non Af Amer: >60
Glucose, Bld: 121 — ABNORMAL HIGH
Potassium: 4.8
Total Bilirubin: 0.5
Total Bilirubin: 1
Total Protein: 6.8

## 2011-06-06 LAB — CBC
HCT: 47.7 — ABNORMAL HIGH
Platelets: 558 — ABNORMAL HIGH
Platelets: 570 — ABNORMAL HIGH
RDW: 15.5
WBC: 15.2 — ABNORMAL HIGH

## 2011-06-06 LAB — DIFFERENTIAL
Basophils Absolute: 0
Basophils Absolute: 0.1
Eosinophils Relative: 1
Lymphocytes Relative: 10 — ABNORMAL LOW
Lymphocytes Relative: 14
Monocytes Absolute: 0.6
Monocytes Relative: 4
Neutro Abs: 17.2 — ABNORMAL HIGH
Neutrophils Relative %: 81 — ABNORMAL HIGH

## 2011-06-06 LAB — POCT PREGNANCY, URINE: Operator id: 294501

## 2011-06-06 LAB — LIPASE, BLOOD: Lipase: 19

## 2011-06-24 ENCOUNTER — Other Ambulatory Visit (HOSPITAL_COMMUNITY): Payer: Self-pay | Admitting: Internal Medicine

## 2011-06-24 DIAGNOSIS — E0590 Thyrotoxicosis, unspecified without thyrotoxic crisis or storm: Secondary | ICD-10-CM

## 2011-06-29 ENCOUNTER — Ambulatory Visit (HOSPITAL_COMMUNITY)
Admission: RE | Admit: 2011-06-29 | Discharge: 2011-06-29 | Disposition: A | Discharge status: Home / Self Care | Payer: 59 | Source: Ambulatory Visit | Attending: Internal Medicine | Admitting: Internal Medicine

## 2011-06-29 DIAGNOSIS — E0590 Thyrotoxicosis, unspecified without thyrotoxic crisis or storm: Secondary | ICD-10-CM | POA: Insufficient documentation

## 2011-06-29 DIAGNOSIS — R634 Abnormal weight loss: Secondary | ICD-10-CM | POA: Insufficient documentation

## 2011-07-13 ENCOUNTER — Other Ambulatory Visit (HOSPITAL_COMMUNITY): Payer: Self-pay | Admitting: Internal Medicine

## 2011-07-13 DIAGNOSIS — E049 Nontoxic goiter, unspecified: Secondary | ICD-10-CM

## 2011-07-18 ENCOUNTER — Encounter (HOSPITAL_COMMUNITY)
Admission: RE | Admit: 2011-07-18 | Discharge: 2011-07-18 | Disposition: A | Discharge status: Home / Self Care | Payer: 59 | Source: Ambulatory Visit | Attending: Internal Medicine | Admitting: Internal Medicine

## 2011-07-18 ENCOUNTER — Encounter (HOSPITAL_COMMUNITY): Payer: Self-pay

## 2011-07-18 DIAGNOSIS — L608 Other nail disorders: Secondary | ICD-10-CM | POA: Insufficient documentation

## 2011-07-18 DIAGNOSIS — R634 Abnormal weight loss: Secondary | ICD-10-CM | POA: Insufficient documentation

## 2011-07-18 DIAGNOSIS — R002 Palpitations: Secondary | ICD-10-CM | POA: Insufficient documentation

## 2011-07-18 DIAGNOSIS — E049 Nontoxic goiter, unspecified: Secondary | ICD-10-CM | POA: Insufficient documentation

## 2011-07-18 HISTORY — DX: Essential (primary) hypertension: I10

## 2011-07-18 MED ORDER — SODIUM IODIDE I 131 CAPSULE
10.0000 | Freq: Once | INTRAVENOUS | Status: AC | PRN
  Administered 2011-07-18: 20 via ORAL

## 2011-07-19 ENCOUNTER — Encounter (HOSPITAL_COMMUNITY)
Admission: RE | Admit: 2011-07-19 | Discharge: 2011-07-19 | Disposition: A | Discharge status: Home / Self Care | Payer: 59 | Source: Ambulatory Visit | Attending: Internal Medicine | Admitting: Internal Medicine

## 2011-07-19 MED ORDER — SODIUM PERTECHNETATE TC 99M INJECTION
10.0000 | Freq: Once | INTRAVENOUS | Status: AC | PRN
  Administered 2011-07-19: 9.9 via INTRAVENOUS

## 2011-09-13 ENCOUNTER — Encounter (HOSPITAL_COMMUNITY): Payer: Self-pay | Admitting: *Deleted

## 2011-09-13 ENCOUNTER — Other Ambulatory Visit: Payer: Self-pay

## 2011-09-13 ENCOUNTER — Emergency Department (HOSPITAL_COMMUNITY)
Admission: EM | Admit: 2011-09-13 | Discharge: 2011-09-14 | Disposition: A | Discharge status: Home / Self Care | Payer: 59 | Attending: Emergency Medicine | Admitting: Emergency Medicine

## 2011-09-13 ENCOUNTER — Emergency Department (HOSPITAL_COMMUNITY): Payer: 59

## 2011-09-13 DIAGNOSIS — I10 Essential (primary) hypertension: Secondary | ICD-10-CM | POA: Insufficient documentation

## 2011-09-13 DIAGNOSIS — J441 Chronic obstructive pulmonary disease with (acute) exacerbation: Secondary | ICD-10-CM | POA: Insufficient documentation

## 2011-09-13 DIAGNOSIS — Z859 Personal history of malignant neoplasm, unspecified: Secondary | ICD-10-CM | POA: Insufficient documentation

## 2011-09-13 DIAGNOSIS — E119 Type 2 diabetes mellitus without complications: Secondary | ICD-10-CM | POA: Insufficient documentation

## 2011-09-13 DIAGNOSIS — F17200 Nicotine dependence, unspecified, uncomplicated: Secondary | ICD-10-CM | POA: Insufficient documentation

## 2011-09-13 MED ORDER — IPRATROPIUM BROMIDE 0.02 % IN SOLN
0.5000 mg | Freq: Once | RESPIRATORY_TRACT | Status: AC
  Administered 2011-09-13: 0.5 mg via RESPIRATORY_TRACT
  Filled 2011-09-13: qty 2.5

## 2011-09-13 NOTE — ED Notes (Signed)
NP cough, heavy chest and SOB; ongoing for few days, hx of COPD

## 2011-09-13 NOTE — ED Notes (Signed)
Pt reports she is breathing better after breathing tx, ginger-ale given

## 2011-09-14 MED ORDER — PREDNISONE 20 MG PO TABS: 60.0000 mg | ORAL_TABLET | Freq: Every day | ORAL | Status: DC

## 2011-09-14 MED ORDER — ALBUTEROL SULFATE (5 MG/ML) 0.5% IN NEBU: 2.5000 mg | INHALATION_SOLUTION | Freq: Four times a day (QID) | RESPIRATORY_TRACT | Status: DC | PRN

## 2011-09-14 MED ORDER — PREDNISONE 20 MG PO TABS
60.0000 mg | ORAL_TABLET | Freq: Every day | ORAL | Status: DC
  Administered 2011-09-14: 60 mg via ORAL
  Filled 2011-09-14: qty 3

## 2011-09-14 NOTE — ED Provider Notes (Signed)
Medical screening examination/treatment/procedure(s) were performed by non-physician practitioner and as supervising physician I was immediately available for consultation/collaboration.   Mccall Lomax L Laquinda Moller, MD 09/14/11 0632 

## 2011-09-14 NOTE — ED Provider Notes (Signed)
History     CSN: [id number]  Arrival date & time 09/13/11  2231   First MD Initiated Contact with Patient 09/13/11 2242      Chief Complaint  Patient presents with  . Shortness of Breath    COPD  . Cough    (Consider location/radiation/quality/duration/timing/severity/associated sxs/prior treatment) Patient is a 41 y.o. female presenting with shortness of breath and cough. The history is provided by the patient.  Shortness of Breath  The current episode started 3 to 5 days ago. The onset was gradual. The problem has been unchanged. The problem is moderate. The symptoms are relieved by nothing. Exacerbated by: Symptoms have been worse since she ran out of her albuterol MDI and her albuterol nebs for her nebulizer machine about one week ago. Associated symptoms include cough, shortness of breath and wheezing. Pertinent negatives include no chest pain, no fever, no rhinorrhea and no sore throat. Associated symptoms comments: She also describes cough which has been nonproductive.. She has had intermittent steroid use. She has had no prior ICU admissions. Recent Medical Care: She finished her prednisone taper approximately 2 weeks ago for similar symptoms.  Cough Associated symptoms include shortness of breath and wheezing. Pertinent negatives include no chest pain, no headaches, no rhinorrhea and no sore throat.    Past Medical History  Diagnosis Date  . Cancer   . Diabetes mellitus   . Hypertension   . Asthma   . COPD (chronic obstructive pulmonary disease)     Past Surgical History  Procedure Date  . Abdominal hysterectomy   . Cholecystectomy   . Hernia repair   . Abdominal surgery     History reviewed. No pertinent family history.  History  Substance Use Topics  . Smoking status: Current Everyday Smoker -- 1.0 packs/day    Types: Cigarettes  . Smokeless tobacco: Not on file  . Alcohol Use: No    OB History    Grav Para Term Preterm Abortions TAB SAB Ect Mult Living                    Review of Systems  Constitutional: Negative for fever.  HENT: Negative for congestion, sore throat, rhinorrhea and neck pain.   Eyes: Negative.   Respiratory: Positive for cough, shortness of breath and wheezing. Negative for chest tightness.   Cardiovascular: Negative for chest pain.  Gastrointestinal: Negative for nausea and abdominal pain.  Genitourinary: Negative.   Musculoskeletal: Negative for joint swelling and arthralgias.  Skin: Negative.  Negative for rash and wound.  Neurological: Negative for dizziness, weakness, light-headedness, numbness and headaches.  Hematological: Negative.   Psychiatric/Behavioral: Negative.     Allergies  Haldol  Home Medications   Current Outpatient Rx  Name Route Sig Dispense Refill  . ALBUTEROL SULFATE (2.5 MG/3ML) 0.083% IN NEBU Nebulization Take 2.5 mg by nebulization every 6 (six) hours as needed. For COPD    . ALBUTEROL SULFATE HFA 108 (90 BASE) MCG/ACT IN AERS Inhalation Inhale 2 puffs into the lungs every 6 (six) hours as needed. For RESCUE    . ASENAPINE MALEATE 10 MG SL SUBL Sublingual Place 1 tablet under the tongue 2 (two) times daily.    Marland Kitchen CLONAZEPAM 2 MG PO TABS Oral Take 2 mg by mouth 2 (two) times daily as needed. For anxiety    . HYDROCODONE-ACETAMINOPHEN 10-325 MG PO TABS Oral Take 1 tablet by mouth every 6 (six) hours as needed. For pain    . IBUPROFEN 200 MG PO TABS  Oral Take 800 mg by mouth as needed. For pain    . NICOTINE 21 MG/24HR TD PT24 Transdermal Place 1 patch onto the skin daily.    Marland Kitchen OLMESARTAN MEDOXOMIL 40 MG PO TABS Oral Take 40 mg by mouth daily.    Marland Kitchen PREGABALIN 75 MG PO CAPS Oral Take 75 mg by mouth 3 (three) times daily.    Marland Kitchen TIOTROPIUM BROMIDE MONOHYDRATE 18 MCG IN CAPS Inhalation Place 18 mcg into inhaler and inhale daily.    . ALBUTEROL SULFATE (5 MG/ML) 0.5% IN NEBU Nebulization Take 0.5 mLs (2.5 mg total) by nebulization every 6 (six) hours as needed for wheezing. 20 mL 12  .  PREDNISONE 20 MG PO TABS Oral Take 3 tablets (60 mg total) by mouth daily. 12 tablet 0    BP 108/76  Pulse 68  Temp(Src) 97.9 F (36.6 C) (Oral)  Resp 18  Ht 5' 5.5" (1.664 m)  Wt 172 lb (78.019 kg)  BMI 28.19 kg/m2  SpO2 94%  Physical Exam  Nursing note and vitals reviewed. Constitutional: She is oriented to person, place, and time. She appears well-developed and well-nourished.  HENT:  Head: Normocephalic and atraumatic.  Eyes: Conjunctivae are normal.  Neck: Normal range of motion.  Cardiovascular: Normal rate, regular rhythm, normal heart sounds and intact distal pulses.   Pulmonary/Chest: Effort normal and breath sounds normal. No respiratory distress. She has no wheezes. She has no rales. She exhibits no tenderness.       No wheezing appreciated on exam but decreased breath sounds throughout all lung fields.   Abdominal: Soft. Bowel sounds are normal. There is no tenderness.  Musculoskeletal: Normal range of motion.  Neurological: She is alert and oriented to person, place, and time.  Skin: Skin is warm and dry.  Psychiatric: She has a normal mood and affect.    ED Course  Procedures (including critical care time)  Labs Reviewed - No data to display Dg Chest 2 View  09/13/2011  *RADIOLOGY REPORT*  Clinical Data: Wheezing, shortness of breath  CHEST - 2 VIEW  Comparison:  12/07/2009  Findings:  The heart size and mediastinal contours are within normal limits.  Both lungs are clear.  The visualized skeletal structures are unremarkable.  IMPRESSION: No active cardiopulmonary disease.  Original Report Authenticated By: Judie Petit. Ruel Favors, M.D.     1. COPD exacerbation     Patient was given an albuterol 5 mg/Atrovent 0.5 mg neb treatments along with 60 mg prednisone by mouth with near complete resolution of the patient's symptoms. She was comfortable at time of discharge with stable vital signs. States at baseline her oxygen saturation typically is around 94% mark.  MDM    Prescription given for prednisone 60 mg daily for the next 4 days. Also given a prescription for her albuterol nebs for her nebulizer machine at home. Followup with Dr. Juanetta Gosling in 3 days for recheck, sooner if symptoms worsen.        Candis Musa, PA 09/14/11 [id number]

## 2011-09-29 ENCOUNTER — Other Ambulatory Visit (HOSPITAL_COMMUNITY): Payer: Self-pay

## 2011-09-30 DIAGNOSIS — K7689 Other specified diseases of liver: Secondary | ICD-10-CM

## 2011-09-30 HISTORY — DX: Other specified diseases of liver: K76.89

## 2011-10-03 ENCOUNTER — Encounter (HOSPITAL_COMMUNITY): Payer: 59

## 2011-10-07 ENCOUNTER — Emergency Department (HOSPITAL_COMMUNITY)
Admission: EM | Admit: 2011-10-07 | Discharge: 2011-10-08 | Disposition: A | Discharge status: Home / Self Care | Payer: 59 | Attending: Emergency Medicine | Admitting: Emergency Medicine

## 2011-10-07 ENCOUNTER — Encounter (HOSPITAL_COMMUNITY): Payer: Self-pay

## 2011-10-07 ENCOUNTER — Emergency Department (HOSPITAL_COMMUNITY): Payer: 59

## 2011-10-07 DIAGNOSIS — IMO0002 Reserved for concepts with insufficient information to code with codable children: Secondary | ICD-10-CM | POA: Insufficient documentation

## 2011-10-07 DIAGNOSIS — J449 Chronic obstructive pulmonary disease, unspecified: Secondary | ICD-10-CM | POA: Insufficient documentation

## 2011-10-07 DIAGNOSIS — R1031 Right lower quadrant pain: Secondary | ICD-10-CM | POA: Insufficient documentation

## 2011-10-07 DIAGNOSIS — R110 Nausea: Secondary | ICD-10-CM | POA: Insufficient documentation

## 2011-10-07 DIAGNOSIS — J4489 Other specified chronic obstructive pulmonary disease: Secondary | ICD-10-CM | POA: Insufficient documentation

## 2011-10-07 DIAGNOSIS — F17200 Nicotine dependence, unspecified, uncomplicated: Secondary | ICD-10-CM | POA: Insufficient documentation

## 2011-10-07 DIAGNOSIS — E119 Type 2 diabetes mellitus without complications: Secondary | ICD-10-CM | POA: Insufficient documentation

## 2011-10-07 DIAGNOSIS — I10 Essential (primary) hypertension: Secondary | ICD-10-CM | POA: Insufficient documentation

## 2011-10-07 DIAGNOSIS — Z79899 Other long term (current) drug therapy: Secondary | ICD-10-CM | POA: Insufficient documentation

## 2011-10-07 LAB — BASIC METABOLIC PANEL
BUN: 15 mg/dL (ref 6–23)
CO2: 28 mEq/L (ref 19–32)
Calcium: 9.7 mg/dL (ref 8.4–10.5)
Chloride: 103 mEq/L (ref 96–112)
Creatinine, Ser: 0.75 mg/dL (ref 0.50–1.10)
Glucose, Bld: 88 mg/dL (ref 70–99)

## 2011-10-07 LAB — DIFFERENTIAL
Eosinophils Relative: 2 % (ref 0–5)
Lymphocytes Relative: 48 % — ABNORMAL HIGH (ref 12–46)
Lymphs Abs: 4.8 10*3/uL — ABNORMAL HIGH (ref 0.7–4.0)
Monocytes Absolute: 0.7 10*3/uL (ref 0.1–1.0)
Monocytes Relative: 7 % (ref 3–12)
Neutro Abs: 4.4 10*3/uL (ref 1.7–7.7)

## 2011-10-07 LAB — URINALYSIS, ROUTINE W REFLEX MICROSCOPIC
Glucose, UA: NEGATIVE mg/dL
Hgb urine dipstick: NEGATIVE
Leukocytes, UA: NEGATIVE
Specific Gravity, Urine: 1.01 (ref 1.005–1.030)
pH: 6.5 (ref 5.0–8.0)

## 2011-10-07 LAB — CBC
HCT: 47.6 % — ABNORMAL HIGH (ref 36.0–46.0)
Hemoglobin: 16 g/dL — ABNORMAL HIGH (ref 12.0–15.0)
MCV: 91.7 fL (ref 78.0–100.0)
WBC: 10.2 10*3/uL (ref 4.0–10.5)

## 2011-10-07 MED ORDER — ONDANSETRON HCL 4 MG/2ML IJ SOLN
4.0000 mg | Freq: Once | INTRAMUSCULAR | Status: AC
  Administered 2011-10-07: 4 mg via INTRAVENOUS
  Filled 2011-10-07: qty 2

## 2011-10-07 MED ORDER — HYDROMORPHONE HCL PF 1 MG/ML IJ SOLN
INTRAMUSCULAR | Status: AC
  Filled 2011-10-07: qty 1

## 2011-10-07 MED ORDER — HYDROMORPHONE HCL PF 1 MG/ML IJ SOLN
1.0000 mg | Freq: Once | INTRAMUSCULAR | Status: AC
  Administered 2011-10-07: 1 mg via INTRAVENOUS
  Filled 2011-10-07: qty 1

## 2011-10-07 MED ORDER — IOHEXOL 300 MG/ML  SOLN
40.0000 mL | Freq: Once | INTRAMUSCULAR | Status: AC | PRN
  Administered 2011-10-07: 40 mL via ORAL

## 2011-10-07 MED ORDER — ONDANSETRON HCL 4 MG/2ML IJ SOLN
4.0000 mg | Freq: Once | INTRAMUSCULAR | Status: AC
  Administered 2011-10-08: 4 mg via INTRAVENOUS
  Filled 2011-10-07 (×2): qty 2

## 2011-10-07 MED ORDER — HYDROCODONE-ACETAMINOPHEN 5-325 MG PO TABS: 1.0000 | ORAL_TABLET | ORAL | Status: AC | PRN

## 2011-10-07 MED ORDER — IOHEXOL 300 MG/ML  SOLN
100.0000 mL | Freq: Once | INTRAMUSCULAR | Status: AC | PRN
  Administered 2011-10-07: 100 mL via INTRAVENOUS

## 2011-10-07 MED ORDER — SODIUM CHLORIDE 0.9 % IV BOLUS (SEPSIS)
500.0000 mL | Freq: Once | INTRAVENOUS | Status: AC
  Administered 2011-10-07: 500 mL via INTRAVENOUS

## 2011-10-07 MED ORDER — PROMETHAZINE HCL 25 MG PO TABS: 12.5000 mg | ORAL_TABLET | Freq: Four times a day (QID) | ORAL | Status: AC | PRN

## 2011-10-07 MED ORDER — HYDROMORPHONE HCL PF 1 MG/ML IJ SOLN
1.0000 mg | Freq: Once | INTRAMUSCULAR | Status: AC
  Administered 2011-10-07: 1 mg via INTRAVENOUS

## 2011-10-07 MED ORDER — HYDROMORPHONE HCL PF 1 MG/ML IJ SOLN
1.0000 mg | Freq: Once | INTRAMUSCULAR | Status: AC
  Administered 2011-10-08: 1 mg via INTRAVENOUS
  Filled 2011-10-07: qty 1

## 2011-10-07 NOTE — ED Notes (Signed)
Pt c/o lower abd pain pt states where hernia is located is. Pain has been present x 1week and a half but increased today. Pt has been unable to get in touch with PCP today. Pt rates pain 7, sharp and throbbing

## 2011-10-07 NOTE — ED Notes (Signed)
Dr. Strand at bedside. 

## 2011-10-07 NOTE — ED Notes (Signed)
Pt ready for ct

## 2011-10-07 NOTE — ED Notes (Signed)
Patient transported to CT 

## 2011-10-07 NOTE — ED Provider Notes (Signed)
This chart was scribed for [employer]. Colon Branch, MD by Williemae Natter. The patient was seen in room [facility] at 8:38 PM.  CSN: [id number]  Arrival date & time 10/07/11  2014   First MD Initiated Contact with Patient 10/07/11 2023      Chief Complaint  Patient presents with  . Abdominal Pain    (Consider location/radiation/quality/duration/timing/severity/associated sxs/prior treatment) HPI Brenda Frank is a 41 y.o. female who presents to the Emergency Department complaining of severe acute onset abdominal pain. Pt is in pain from her abdominal hernia. Pt has been in pain for the past week and a half. Pain is at its worst today, pt had 2 hernia repairs and one of them was strangulated.  Past Medical History  Diagnosis Date  . Cancer   . Diabetes mellitus   . Hypertension   . Asthma   . COPD (chronic obstructive pulmonary disease)     Past Surgical History  Procedure Date  . Abdominal hysterectomy   . Cholecystectomy   . Hernia repair   . Abdominal surgery   . Appendectomy     No family history on file.  History  Substance Use Topics  . Smoking status: Current Everyday Smoker -- 0.5 packs/day    Types: Cigarettes  . Smokeless tobacco: Not on file  . Alcohol Use: No    OB History    Grav Para Term Preterm Abortions TAB SAB Ect Mult Living                  Review of Systems 10 Systems reviewed and are negative for acute change except as noted in the HPI.  Allergies  Haldol  Home Medications   Current Outpatient Rx  Name Route Sig Dispense Refill  . ALBUTEROL SULFATE (2.5 MG/3ML) 0.083% IN NEBU Nebulization Take 2.5 mg by nebulization every 6 (six) hours as needed. For COPD    . ALBUTEROL SULFATE (5 MG/ML) 0.5% IN NEBU Nebulization Take 0.5 mLs (2.5 mg total) by nebulization every 6 (six) hours as needed for wheezing. 20 mL 12  . ALBUTEROL SULFATE HFA 108 (90 BASE) MCG/ACT IN AERS Inhalation Inhale 2 puffs into the lungs every 6 (six) hours as needed. For  RESCUE    . ASENAPINE MALEATE 10 MG SL SUBL Sublingual Place 1 tablet under the tongue 2 (two) times daily.    Marland Kitchen CLONAZEPAM 2 MG PO TABS Oral Take 2 mg by mouth 2 (two) times daily as needed. For anxiety    . HYDROCODONE-ACETAMINOPHEN 10-325 MG PO TABS Oral Take 1 tablet by mouth every 6 (six) hours as needed. For pain    . IBUPROFEN 200 MG PO TABS Oral Take 800 mg by mouth as needed. For pain    . NICOTINE 21 MG/24HR TD PT24 Transdermal Place 1 patch onto the skin daily.    Marland Kitchen OLMESARTAN MEDOXOMIL 40 MG PO TABS Oral Take 40 mg by mouth daily.    Marland Kitchen PREDNISONE 20 MG PO TABS Oral Take 3 tablets (60 mg total) by mouth daily. 12 tablet 0  . PREGABALIN 75 MG PO CAPS Oral Take 75 mg by mouth 3 (three) times daily.    Marland Kitchen TIOTROPIUM BROMIDE MONOHYDRATE 18 MCG IN CAPS Inhalation Place 18 mcg into inhaler and inhale daily.      BP 149/104  Pulse 100  Temp(Src) 97.3 F (36.3 C) (Oral)  Resp 20  Ht 5' 5.5" (1.664 m)  Wt 175 lb (79.379 kg)  BMI 28.68 kg/m2  SpO2 98%  Physical Exam  Nursing note and vitals reviewed. Constitutional: She is oriented to person, place, and time. She appears well-developed and well-nourished.       Poor dentition  HENT:  Head: Normocephalic and atraumatic.  Eyes: Conjunctivae are normal. Pupils are equal, round, and reactive to light.  Neck: Normal range of motion. Neck supple.  Cardiovascular: Normal rate.   Pulmonary/Chest: Effort normal.  Abdominal: Bowel sounds are normal. There is tenderness.  Musculoskeletal: Normal range of motion.  Neurological: She is alert and oriented to person, place, and time.  Skin: Skin is warm and dry.  Psychiatric: She has a normal mood and affect. Her behavior is normal.    ED Course  Procedures (including critical care time) DIAGNOSTIC STUDIES: Oxygen Saturation is 98% on room air, normal by my interpretation.    COORDINATION OF CARE: Medications - No data to display  Results for orders placed during the hospital  encounter of 10/07/11  URINALYSIS, ROUTINE W REFLEX MICROSCOPIC      Component Value Range   Color, Urine YELLOW  YELLOW    APPearance CLEAR  CLEAR    Specific Gravity, Urine 1.010  1.005 - 1.030    pH 6.5  5.0 - 8.0    Glucose, UA NEGATIVE  NEGATIVE (mg/dL)   Hgb urine dipstick NEGATIVE  NEGATIVE    Bilirubin Urine NEGATIVE  NEGATIVE    Ketones, ur NEGATIVE  NEGATIVE (mg/dL)   Protein, ur NEGATIVE  NEGATIVE (mg/dL)   Urobilinogen, UA 0.2  0.0 - 1.0 (mg/dL)   Nitrite NEGATIVE  NEGATIVE    Leukocytes, UA NEGATIVE  NEGATIVE   CBC      Component Value Range   WBC 10.2  4.0 - 10.5 (K/uL)   RBC 5.19 (*) 3.87 - 5.11 (MIL/uL)   Hemoglobin 16.0 (*) 12.0 - 15.0 (g/dL)   HCT 82.9 (*) 56.2 - 46.0 (%)   MCV 91.7  78.0 - 100.0 (fL)   MCH 30.8  26.0 - 34.0 (pg)   MCHC 33.6  30.0 - 36.0 (g/dL)   RDW 13.0  86.5 - 78.4 (%)   Platelets 296  150 - 400 (K/uL)  DIFFERENTIAL      Component Value Range   Neutrophils Relative 44  43 - 77 (%)   Neutro Abs 4.4  1.7 - 7.7 (K/uL)   Lymphocytes Relative 48 (*) 12 - 46 (%)   Lymphs Abs 4.8 (*) 0.7 - 4.0 (K/uL)   Monocytes Relative 7  3 - 12 (%)   Monocytes Absolute 0.7  0.1 - 1.0 (K/uL)   Eosinophils Relative 2  0 - 5 (%)   Eosinophils Absolute 0.2  0.0 - 0.7 (K/uL)   Basophils Relative 0  0 - 1 (%)   Basophils Absolute 0.0  0.0 - 0.1 (K/uL)  BASIC METABOLIC PANEL      Component Value Range   Sodium 139  135 - 145 (mEq/L)   Potassium 4.6  3.5 - 5.1 (mEq/L)   Chloride 103  96 - 112 (mEq/L)   CO2 28  19 - 32 (mEq/L)   Glucose, Bld 88  70 - 99 (mg/dL)   BUN 15  6 - 23 (mg/dL)   Creatinine, Ser 6.96  0.50 - 1.10 (mg/dL)   Calcium 9.7  8.4 - 29.5 (mg/dL)   GFR calc non Af Amer >90  >90 (mL/min)   GFR calc Af Amer >90  >90 (mL/min)   Ct Abdomen Pelvis W Contrast  10/07/2011  *RADIOLOGY REPORT*  Clinical Data:  Right lower quadrant pain for 10 days.  History of hernia repair, gallbladder surgery, appendectomy, hysterectomy. Diabetes, hypertension,  asthma, COPD.  CT ABDOMEN AND PELVIS WITH CONTRAST  Technique:  Multidetector CT imaging of the abdomen and pelvis was performed following the standard protocol during bolus administration of intravenous contrast.  Contrast: OMNIPAQUE IOHEXOL 300 MG/ML IV SOLN, 40mL OMNIPAQUE IOHEXOL 300 MG/ML IV SOLN  Comparison: 08/27/2009  Findings: The lung bases are unremarkable in appearance.  Left hepatic low density lesion is 11 mm in diameter.  No focal abnormality identified within the spleen, pancreas, adrenal glands, or kidneys.  The stomach is distended with debris and contrast. Small bowel loops have a normal appearance.  There is moderate stool throughout the colon which otherwise has a normal appearance. The appendix is surgically absent.  The gallbladder is surgically absent. No retroperitoneal or mesenteric adenopathy. No evidence for aortic aneurysm.  The uterus is surgically absent.  No evidence for adnexal mass or free pelvic fluid.  There are mild degenerative changes in the spine.  IMPRESSION:  1.  Postoperative changes. 2.  Small left hepatic lobe lesion, likely representing a cyst. 3.  No evidence for bowel obstruction. 4.  Moderate stool throughout colon.  Original Report Authenticated By: Patterson Hammersmith, M.D.       MDM  Patient with h/o right sided hernia here with abdominal pain to the RLQ all day. Nausea, no vomiting. Has taken 2 Vicodin without relief.Given IVF, analgesics and antiemetics with some improvement. CT of the abdomen without acute findings and no evidence of hernia strangulation.Pt stable in ED with no significant deterioration in condition.The patient appears reasonably screened and/or stabilized for discharge and I doubt any other medical condition or other [facility] requiring further screening, evaluation, or treatment in the ED at this time prior to discharge.  I personally performed the services described in this documentation, which was scribed in my presence. The recorded  information has been reviewed and considered.  MDM Reviewed: nursing note and vitals Interpretation: labs and CT scan        Nicoletta Dress. Colon Branch, MD 10/07/11 2354

## 2011-10-07 NOTE — ED Notes (Signed)
Pt presented to the Ed with abdominal pain

## 2011-10-13 ENCOUNTER — Ambulatory Visit (HOSPITAL_COMMUNITY)
Admission: RE | Admit: 2011-10-13 | Discharge: 2011-10-13 | Disposition: A | Discharge status: Home / Self Care | Payer: 59 | Source: Ambulatory Visit | Attending: Pulmonary Disease | Admitting: Pulmonary Disease

## 2011-10-13 DIAGNOSIS — R0602 Shortness of breath: Secondary | ICD-10-CM | POA: Insufficient documentation

## 2011-10-13 MED ORDER — ALBUTEROL SULFATE (5 MG/ML) 0.5% IN NEBU
2.5000 mg | INHALATION_SOLUTION | Freq: Once | RESPIRATORY_TRACT | Status: AC
  Administered 2011-10-13: 2.5 mg via RESPIRATORY_TRACT

## 2011-10-17 NOTE — Procedures (Signed)
Brenda Frank, Brenda Frank                 ACCOUNT NO.:  [id number]  MEDICAL RECORD NO.:  [id number]  LOCATION:                                 FACILITY:  PHYSICIAN:  Maritza Goldsborough L. Juanetta Gosling, M.D.DATE OF BIRTH:  [birth year]  DATE OF PROCEDURE: DATE OF DISCHARGE:                           PULMONARY FUNCTION TEST   Reason for pulmonary function testing is shortness of breath. 1. Spirometry shows a mild ventilatory defect with evidence of airflow     obstruction that is most marked in the smaller airways. 2. Lung volumes show no evidence of restrictive change but do show air     trapping. 3. DLCO is mildly reduced and does correct some for volume. 4. Airway resistance appears to be increased. 5. There was no significant bronchodilator improvement.     Christle Nolting L. Juanetta Gosling, M.D.     EL[facility]/MEDQ  D:  10/16/2011  T:  10/17/2011  Job:  [id number]

## 2011-10-21 ENCOUNTER — Encounter (HOSPITAL_COMMUNITY): Payer: Self-pay | Admitting: *Deleted

## 2011-10-21 ENCOUNTER — Emergency Department (HOSPITAL_COMMUNITY)
Admission: EM | Admit: 2011-10-21 | Discharge: 2011-10-21 | Disposition: A | Discharge status: Home / Self Care | Payer: 59 | Attending: Emergency Medicine | Admitting: Emergency Medicine

## 2011-10-21 DIAGNOSIS — J4489 Other specified chronic obstructive pulmonary disease: Secondary | ICD-10-CM | POA: Insufficient documentation

## 2011-10-21 DIAGNOSIS — Z859 Personal history of malignant neoplasm, unspecified: Secondary | ICD-10-CM | POA: Insufficient documentation

## 2011-10-21 DIAGNOSIS — K5900 Constipation, unspecified: Secondary | ICD-10-CM | POA: Insufficient documentation

## 2011-10-21 DIAGNOSIS — E119 Type 2 diabetes mellitus without complications: Secondary | ICD-10-CM | POA: Insufficient documentation

## 2011-10-21 DIAGNOSIS — Z9079 Acquired absence of other genital organ(s): Secondary | ICD-10-CM | POA: Insufficient documentation

## 2011-10-21 DIAGNOSIS — R10819 Abdominal tenderness, unspecified site: Secondary | ICD-10-CM | POA: Insufficient documentation

## 2011-10-21 DIAGNOSIS — I10 Essential (primary) hypertension: Secondary | ICD-10-CM | POA: Insufficient documentation

## 2011-10-21 DIAGNOSIS — F17200 Nicotine dependence, unspecified, uncomplicated: Secondary | ICD-10-CM | POA: Insufficient documentation

## 2011-10-21 DIAGNOSIS — J449 Chronic obstructive pulmonary disease, unspecified: Secondary | ICD-10-CM | POA: Insufficient documentation

## 2011-10-21 DIAGNOSIS — Z9889 Other specified postprocedural states: Secondary | ICD-10-CM | POA: Insufficient documentation

## 2011-10-21 DIAGNOSIS — R109 Unspecified abdominal pain: Secondary | ICD-10-CM | POA: Insufficient documentation

## 2011-10-21 DIAGNOSIS — R61 Generalized hyperhidrosis: Secondary | ICD-10-CM | POA: Insufficient documentation

## 2011-10-21 LAB — URINALYSIS, ROUTINE W REFLEX MICROSCOPIC
Bilirubin Urine: NEGATIVE
Ketones, ur: NEGATIVE mg/dL
Nitrite: NEGATIVE
Specific Gravity, Urine: 1.01 (ref 1.005–1.030)
Urobilinogen, UA: 0.2 mg/dL (ref 0.0–1.0)

## 2011-10-21 LAB — COMPREHENSIVE METABOLIC PANEL
ALT: 16 U/L (ref 0–35)
BUN: 14 mg/dL (ref 6–23)
CO2: 31 mEq/L (ref 19–32)
Calcium: 10.5 mg/dL (ref 8.4–10.5)
Creatinine, Ser: 0.76 mg/dL (ref 0.50–1.10)
GFR calc Af Amer: >90 mL/min (ref >90)
GFR calc non Af Amer: >90 mL/min (ref >90)
Glucose, Bld: 99 mg/dL (ref 70–99)
Sodium: 139 mEq/L (ref 135–145)

## 2011-10-21 LAB — CBC
MCH: 30.1 pg (ref 26.0–34.0)
MCHC: 33.2 g/dL (ref 30.0–36.0)
Platelets: 309 10*3/uL (ref 150–400)
RBC: 5.41 MIL/uL — ABNORMAL HIGH (ref 3.87–5.11)

## 2011-10-21 LAB — DIFFERENTIAL
Basophils Relative: 0 % (ref 0–1)
Eosinophils Absolute: 0.1 10*3/uL (ref 0.0–0.7)
Neutro Abs: 5.9 10*3/uL (ref 1.7–7.7)
Neutrophils Relative %: 54 % (ref 43–77)

## 2011-10-21 LAB — URINE MICROSCOPIC-ADD ON

## 2011-10-21 LAB — LIPASE, BLOOD: Lipase: 22 U/L (ref 11–59)

## 2011-10-21 MED ORDER — LORAZEPAM 2 MG/ML IJ SOLN
1.0000 mg | Freq: Once | INTRAMUSCULAR | Status: AC
  Administered 2011-10-21: 1 mg via INTRAVENOUS
  Filled 2011-10-21: qty 1

## 2011-10-21 MED ORDER — MAGNESIUM CITRATE PO SOLN: 296.0000 mL | Freq: Once | ORAL | Status: DC

## 2011-10-21 MED ORDER — DICYCLOMINE HCL 10 MG PO CAPS
10.0000 mg | ORAL_CAPSULE | Freq: Once | ORAL | Status: AC
  Administered 2011-10-21: 10 mg via ORAL
  Filled 2011-10-21: qty 1

## 2011-10-21 MED ORDER — SODIUM CHLORIDE 0.9 % IV BOLUS (SEPSIS)
1000.0000 mL | Freq: Once | INTRAVENOUS | Status: AC
  Administered 2011-10-21: 1000 mL via INTRAVENOUS

## 2011-10-21 MED ORDER — TRAMADOL HCL 50 MG PO TABS
50.0000 mg | ORAL_TABLET | Freq: Once | ORAL | Status: AC
  Filled 2011-10-21: qty 1

## 2011-10-21 MED ORDER — HYDROMORPHONE HCL PF 1 MG/ML IJ SOLN
1.0000 mg | Freq: Once | INTRAMUSCULAR | Status: AC
  Administered 2011-10-21: 1 mg via INTRAVENOUS
  Filled 2011-10-21: qty 1

## 2011-10-21 MED ORDER — ONDANSETRON HCL 4 MG/2ML IJ SOLN
4.0000 mg | Freq: Once | INTRAMUSCULAR | Status: AC
  Administered 2011-10-21: 4 mg via INTRAVENOUS
  Filled 2011-10-21: qty 2

## 2011-10-21 NOTE — ED Notes (Signed)
Abdominal pain with constipation, states she has been constipated for over 2 weeks, taking miralax and enemas without relief

## 2011-10-21 NOTE — ED Provider Notes (Signed)
This chart was scribed for Brenda Munch, MD by [name]. The patient was seen in room APA09/APA09 and the patient's care was started at 8:47 PM.   CSN: [id number]  Arrival date & time 10/21/11  4098   First MD Initiated Contact with Patient 10/21/11 2023      Chief Complaint  Patient presents with  . Abdominal Pain   HPI  Brenda Frank is a 41 y.o. female who presents to the Emergency Department complaining of gradual onset, intermittent, gradually worsening, moderate to severe right sided abdominal pain onset a month and a half ago. The pain has radiated to the left side in the past 2 weeks.  Pt has a hernia in the same location. Pt c/o associated sweats, gas, constipation (2 weeks).  Pt was seen in the ER for the same sx 2 weeks ago.  Pt has been taking miralax and colace daily since last ER visit w/ no relief of sx. Pt c/o difficulty urinating.  Denies n/v/d.   There are no other associated symptoms and no other alleviating or aggravating factors.    Pt w/ h/o COPD, DM, cholecystectomy, appendectomy Past Medical History  Diagnosis Date  . Cancer   . Diabetes mellitus   . Hypertension   . Asthma   . COPD (chronic obstructive pulmonary disease)     Past Surgical History  Procedure Date  . Abdominal hysterectomy   . Cholecystectomy   . Hernia repair   . Abdominal surgery   . Appendectomy     No family history on file.  History  Substance Use Topics  . Smoking status: Current Everyday Smoker -- 0.5 packs/day    Types: Cigarettes  . Smokeless tobacco: Not on file  . Alcohol Use: No    OB History    Grav Para Term Preterm Abortions TAB SAB Ect Mult Living                  Review of Systems  Constitutional: Positive for diaphoresis. Negative for fever.  HENT: Negative for rhinorrhea.   Eyes: Negative for pain.  Respiratory: Negative for cough and shortness of breath.   Cardiovascular: Negative for chest pain.  Gastrointestinal: Positive for abdominal  pain and constipation. Negative for nausea, vomiting and diarrhea.  Genitourinary: Positive for difficulty urinating. Negative for dysuria.  Musculoskeletal: Negative for back pain.  Skin: Negative for rash.  Neurological: Negative for weakness and headaches.    Allergies  Haldol  Home Medications   Current Outpatient Rx  Name Route Sig Dispense Refill  . ALBUTEROL SULFATE (5 MG/ML) 0.5% IN NEBU Nebulization Take 0.5 mLs (2.5 mg total) by nebulization every 6 (six) hours as needed for wheezing. 20 mL 12  . ALBUTEROL SULFATE HFA 108 (90 BASE) MCG/ACT IN AERS Inhalation Inhale 2 puffs into the lungs every 6 (six) hours as needed. For RESCUE    . ASENAPINE MALEATE 10 MG SL SUBL Sublingual Place 1 tablet under the tongue 2 (two) times daily.    Marland Kitchen BISACODYL 5 MG PO TBEC Oral Take 15 mg by mouth once as needed.    Marland Kitchen CLONAZEPAM 2 MG PO TABS Oral Take 2 mg by mouth 2 (two) times daily as needed. For anxiety    . DOCUSATE SODIUM 100 MG PO CAPS Oral Take 300 mg by mouth daily.    Marland Kitchen HYDROCODONE-ACETAMINOPHEN 10-325 MG PO TABS Oral Take 1 tablet by mouth every 6 (six) hours as needed. For pain    . IBUPROFEN 200 MG  PO TABS Oral Take 800 mg by mouth as needed. For pain    . OLMESARTAN MEDOXOMIL 40 MG PO TABS Oral Take 40 mg by mouth daily.    Marland Kitchen POLYETHYLENE GLYCOL 3350 PO POWD Oral Take 17 g by mouth daily.    Marland Kitchen PREGABALIN 75 MG PO CAPS Oral Take 75 mg by mouth 3 (three) times daily.    Marland Kitchen FLEET PEDIATRIC 3.5-9.5 GM/59ML RE ENEM Rectal Place 1 enema rectally once.    Marland Kitchen TIOTROPIUM BROMIDE MONOHYDRATE 18 MCG IN CAPS Inhalation Place 18 mcg into inhaler and inhale daily.      BP 130/78  Pulse 114  Temp(Src) 97.3 F (36.3 C) (Oral)  Resp 26  Ht 5\' 6"  (1.676 m)  Wt 187 lb (84.823 kg)  BMI 30.18 kg/m2  SpO2 97%  Physical Exam  Nursing note and vitals reviewed. Constitutional: She is oriented to person, place, and time. She appears well-developed and well-nourished. No distress.  HENT:  Head:  Normocephalic and atraumatic.  Eyes: EOM are normal. Pupils are equal, round, and reactive to light.  Neck: Normal range of motion. Neck supple. No tracheal deviation present.  Cardiovascular: Normal rate and regular rhythm.   Pulmonary/Chest: Effort normal and breath sounds normal. No respiratory distress.  Abdominal: Soft. Bowel sounds are normal. She exhibits no distension. There is tenderness.       Diffuse lower abdominal pain   Musculoskeletal: Normal range of motion. She exhibits no edema.  Neurological: She is alert and oriented to person, place, and time. No sensory deficit.  Skin: Skin is warm and dry.  Psychiatric: She has a normal mood and affect. Her behavior is normal.    ED Course  Procedures (including critical care time) DIAGNOSTIC STUDIES: Oxygen Saturation is 97% on room air, normal by my interpretation.    COORDINATION OF CARE:  8:52 PM: Pt evaluated, physical exam complete  10:49 PM: EDP at bedside.  All results reviewed and discussed with pt, questions answered, pt agreeable with plan.  Labs Reviewed  COMPREHENSIVE METABOLIC PANEL - Abnormal; Notable for the following:    Total Bilirubin 0.2 (*)    All other components within normal limits  URINALYSIS, ROUTINE W REFLEX MICROSCOPIC - Abnormal; Notable for the following:    Leukocytes, UA TRACE (*)    All other components within normal limits  CBC - Abnormal; Notable for the following:    WBC 11.0 (*)    RBC 5.41 (*)    Hemoglobin 16.3 (*)    HCT 49.1 (*)    All other components within normal limits  DIFFERENTIAL - Abnormal; Notable for the following:    Lymphs Abs 4.3 (*)    All other components within normal limits  LIPASE, BLOOD  URINE MICROSCOPIC-ADD ON   No results found.   No diagnosis found.  CT from two weeks ago (and prior studies) reviewed by me.  MDM  This 41 year old female presents for the second time in 2 weeks with right-sided abdominal pain.  The patient also complains of  constipation.  Notably, the patient has a history of abdominal pain, which was previously diffuse, for approximately 1.5 years.  On exam the patient is in no distress, though she is uncomfortable appearing.  Patient is afebrile.  The patient's belly is soft with minimal discomfort on palpation.  Prior studies do not demonstrate acute pathology, and the patient has no headaches, no gallbladder, no uterus.  The patient's labs are consistent with prior studies.  The patient's presentation may  be secondary to ileus, or subacute infection, or post operative changes with concern for narcotic influenced ileus in particular.  All findings and thoughts were discussed with the patient and her wife, who is a Designer, jewellery.  The patient has GI followup, which will be expedited.  Absent fever, nuchal psychosis, the patient will not be prescribed antibiotics.  She was advised to minimize narcotics.  She is discharged in stable condition.  Brenda Munch, MD 10/21/11 [phone]

## 2011-10-21 NOTE — Discharge Instructions (Signed)

## 2011-10-21 NOTE — ED Notes (Signed)
Pt very upset upon discharge stating that "CT scan should have been done to prove that I didn't have a strangulated hernia." pt refused ultram stating that it gives her hallucinations. Pt left room stating that if I stay any longer I'm not going to be a pleasant.

## 2011-10-21 NOTE — ED Notes (Signed)
MD at bedside. 

## 2011-10-21 NOTE — ED Notes (Signed)
Pt reports having abdominal pain and constipation x 2 weeks. Pt denies any n/v/d. Pt reports having gas and that she has been taking miralax and colace daily for 2 weeks. Pt tearful at this time. Pt reports administering an enema 2 hours prior to arrival.

## 2011-10-24 ENCOUNTER — Emergency Department (HOSPITAL_COMMUNITY): Payer: 59

## 2011-10-24 ENCOUNTER — Observation Stay (HOSPITAL_COMMUNITY)
Admission: EM | Admit: 2011-10-24 | Discharge: 2011-10-26 | Disposition: A | Discharge status: Home / Self Care | Payer: 59 | Attending: Internal Medicine | Admitting: Internal Medicine

## 2011-10-24 ENCOUNTER — Encounter (HOSPITAL_COMMUNITY): Payer: Self-pay | Admitting: *Deleted

## 2011-10-24 DIAGNOSIS — J4489 Other specified chronic obstructive pulmonary disease: Secondary | ICD-10-CM

## 2011-10-24 DIAGNOSIS — D751 Secondary polycythemia: Secondary | ICD-10-CM

## 2011-10-24 DIAGNOSIS — R109 Unspecified abdominal pain: Secondary | ICD-10-CM | POA: Insufficient documentation

## 2011-10-24 DIAGNOSIS — R1011 Right upper quadrant pain: Secondary | ICD-10-CM

## 2011-10-24 DIAGNOSIS — J449 Chronic obstructive pulmonary disease, unspecified: Secondary | ICD-10-CM | POA: Diagnosis present

## 2011-10-24 DIAGNOSIS — K633 Ulcer of intestine: Principal | ICD-10-CM

## 2011-10-24 DIAGNOSIS — K7689 Other specified diseases of liver: Secondary | ICD-10-CM

## 2011-10-24 DIAGNOSIS — K5900 Constipation, unspecified: Secondary | ICD-10-CM | POA: Insufficient documentation

## 2011-10-24 DIAGNOSIS — I10 Essential (primary) hypertension: Secondary | ICD-10-CM

## 2011-10-24 DIAGNOSIS — G8929 Other chronic pain: Secondary | ICD-10-CM

## 2011-10-24 DIAGNOSIS — E119 Type 2 diabetes mellitus without complications: Secondary | ICD-10-CM

## 2011-10-24 DIAGNOSIS — K5730 Diverticulosis of large intestine without perforation or abscess without bleeding: Secondary | ICD-10-CM | POA: Insufficient documentation

## 2011-10-24 DIAGNOSIS — Z8659 Personal history of other mental and behavioral disorders: Secondary | ICD-10-CM

## 2011-10-24 DIAGNOSIS — F17200 Nicotine dependence, unspecified, uncomplicated: Secondary | ICD-10-CM | POA: Insufficient documentation

## 2011-10-24 DIAGNOSIS — D45 Polycythemia vera: Secondary | ICD-10-CM | POA: Insufficient documentation

## 2011-10-24 DIAGNOSIS — F319 Bipolar disorder, unspecified: Secondary | ICD-10-CM | POA: Insufficient documentation

## 2011-10-24 DIAGNOSIS — Z720 Tobacco use: Secondary | ICD-10-CM

## 2011-10-24 HISTORY — DX: Ulcer of intestine: K63.3

## 2011-10-24 HISTORY — DX: Secondary polycythemia: D75.1

## 2011-10-24 HISTORY — DX: Other specified diseases of liver: K76.89

## 2011-10-24 HISTORY — DX: Diverticulosis of intestine, part unspecified, without perforation or abscess without bleeding: K57.90

## 2011-10-24 LAB — COMPREHENSIVE METABOLIC PANEL
AST: 14 U/L (ref 0–37)
Calcium: 10.6 mg/dL — ABNORMAL HIGH (ref 8.4–10.5)
Chloride: 101 mEq/L (ref 96–112)
GFR calc Af Amer: >90 mL/min (ref >90)
GFR calc non Af Amer: >90 mL/min (ref >90)
Potassium: 4.1 mEq/L (ref 3.5–5.1)
Sodium: 141 mEq/L (ref 135–145)

## 2011-10-24 LAB — URINALYSIS, ROUTINE W REFLEX MICROSCOPIC
Bilirubin Urine: NEGATIVE
Glucose, UA: NEGATIVE mg/dL
Hgb urine dipstick: NEGATIVE
Ketones, ur: NEGATIVE mg/dL
Protein, ur: NEGATIVE mg/dL

## 2011-10-24 LAB — DIFFERENTIAL
Basophils Relative: 0 % (ref 0–1)
Eosinophils Absolute: 0 10*3/uL (ref 0.0–0.7)
Lymphs Abs: 2.9 10*3/uL (ref 0.7–4.0)
Neutro Abs: 7.4 10*3/uL (ref 1.7–7.7)
Neutrophils Relative %: 68 % (ref 43–77)

## 2011-10-24 LAB — CBC
Hemoglobin: 17.3 g/dL — ABNORMAL HIGH (ref 12.0–15.0)
MCH: 30.6 pg (ref 26.0–34.0)
MCHC: 33.9 g/dL (ref 30.0–36.0)
Platelets: 328 10*3/uL (ref 150–400)
RBC: 5.66 MIL/uL — ABNORMAL HIGH (ref 3.87–5.11)

## 2011-10-24 LAB — LIPASE, BLOOD: Lipase: 17 U/L (ref 11–59)

## 2011-10-24 MED ORDER — ONDANSETRON HCL 4 MG/2ML IJ SOLN
4.0000 mg | Freq: Four times a day (QID) | INTRAMUSCULAR | Status: DC | PRN
  Administered 2011-10-24 – 2011-10-25 (×3): 4 mg via INTRAVENOUS
  Filled 2011-10-24 (×4): qty 2

## 2011-10-24 MED ORDER — HYDROCODONE-ACETAMINOPHEN 10-325 MG PO TABS
1.0000 | ORAL_TABLET | Freq: Four times a day (QID) | ORAL | Status: DC | PRN
  Administered 2011-10-26: 1 via ORAL
  Filled 2011-10-24: qty 1

## 2011-10-24 MED ORDER — SODIUM CHLORIDE 0.9 % IV SOLN
1000.0000 mL | Freq: Once | INTRAVENOUS | Status: AC
  Administered 2011-10-24: 1000 mL via INTRAVENOUS

## 2011-10-24 MED ORDER — NICOTINE 21 MG/24HR TD PT24
21.0000 mg | MEDICATED_PATCH | Freq: Every day | TRANSDERMAL | Status: DC
  Administered 2011-10-24 – 2011-10-25 (×2): 21 mg via TRANSDERMAL
  Filled 2011-10-24 (×3): qty 1

## 2011-10-24 MED ORDER — ONDANSETRON HCL 4 MG PO TABS: 4.0000 mg | ORAL_TABLET | Freq: Four times a day (QID) | ORAL | Status: DC | PRN

## 2011-10-24 MED ORDER — TIOTROPIUM BROMIDE MONOHYDRATE 18 MCG IN CAPS
18.0000 ug | ORAL_CAPSULE | Freq: Every day | RESPIRATORY_TRACT | Status: DC
  Administered 2011-10-25 – 2011-10-26 (×2): 18 ug via RESPIRATORY_TRACT
  Filled 2011-10-24: qty 5

## 2011-10-24 MED ORDER — HYDROMORPHONE HCL PF 2 MG/ML IJ SOLN
2.0000 mg | Freq: Once | INTRAMUSCULAR | Status: AC
  Administered 2011-10-24: 2 mg via INTRAVENOUS

## 2011-10-24 MED ORDER — OLMESARTAN MEDOXOMIL 20 MG PO TABS
40.0000 mg | ORAL_TABLET | Freq: Every day | ORAL | Status: DC
  Administered 2011-10-24 – 2011-10-26 (×3): 40 mg via ORAL
  Filled 2011-10-24 (×3): qty 2

## 2011-10-24 MED ORDER — HYDROMORPHONE HCL PF 1 MG/ML IJ SOLN
1.0000 mg | INTRAMUSCULAR | Status: DC | PRN
  Administered 2011-10-24 – 2011-10-26 (×12): 1 mg via INTRAVENOUS
  Filled 2011-10-24 (×12): qty 1

## 2011-10-24 MED ORDER — ONDANSETRON HCL 4 MG/2ML IJ SOLN
4.0000 mg | Freq: Once | INTRAMUSCULAR | Status: AC
  Administered 2011-10-24: 4 mg via INTRAVENOUS
  Filled 2011-10-24: qty 2

## 2011-10-24 MED ORDER — PREGABALIN 75 MG PO CAPS
75.0000 mg | ORAL_CAPSULE | Freq: Three times a day (TID) | ORAL | Status: DC
  Administered 2011-10-24 – 2011-10-26 (×3): 75 mg via ORAL
  Filled 2011-10-24 (×4): qty 1

## 2011-10-24 MED ORDER — PEG 3350-KCL-NA BICARB-NACL 420 G PO SOLR
4000.0000 mL | Freq: Once | ORAL | Status: AC
  Administered 2011-10-24: 4000 mL via ORAL
  Filled 2011-10-24: qty 4000

## 2011-10-24 MED ORDER — ALBUTEROL SULFATE (5 MG/ML) 0.5% IN NEBU: 2.5000 mg | INHALATION_SOLUTION | Freq: Four times a day (QID) | RESPIRATORY_TRACT | Status: DC | PRN

## 2011-10-24 MED ORDER — HYDROMORPHONE HCL PF 2 MG/ML IJ SOLN
INTRAMUSCULAR | Status: AC
  Administered 2011-10-24: 2 mg via INTRAVENOUS
  Filled 2011-10-24: qty 1

## 2011-10-24 MED ORDER — PEG 3350-KCL-NA BICARB-NACL 420 G PO SOLR
ORAL | Status: AC
  Filled 2011-10-24: qty 4000

## 2011-10-24 MED ORDER — POLYETHYLENE GLYCOL 3350 17 G PO PACK
PACK | ORAL | Status: AC
  Filled 2011-10-24: qty 2

## 2011-10-24 MED ORDER — POLYETHYLENE GLYCOL 3350 17 GM/SCOOP PO POWD
17.0000 g | Freq: Two times a day (BID) | ORAL | Status: DC
  Filled 2011-10-24: qty 255

## 2011-10-24 MED ORDER — IRBESARTAN 150 MG PO TABS: 150.0000 mg | ORAL_TABLET | Freq: Every day | ORAL | Status: DC

## 2011-10-24 MED ORDER — CLONAZEPAM 0.5 MG PO TABS
2.0000 mg | ORAL_TABLET | Freq: Two times a day (BID) | ORAL | Status: DC
  Administered 2011-10-24 – 2011-10-26 (×3): 2 mg via ORAL
  Filled 2011-10-24 (×3): qty 4

## 2011-10-24 MED ORDER — SODIUM CHLORIDE 0.9 % IV SOLN
1000.0000 mL | INTRAVENOUS | Status: DC
Start: 2011-10-24 — End: 2011-10-26
  Administered 2011-10-25 – 2011-10-26 (×2): 1000 mL via INTRAVENOUS

## 2011-10-24 MED ORDER — SODIUM CHLORIDE 0.9 % IV SOLN
INTRAVENOUS | Status: AC
  Administered 2011-10-24: 20:00:00 via INTRAVENOUS

## 2011-10-24 MED ORDER — PEG 3350-KCL-NA BICARB-NACL 420 G PO SOLR: 4000.0000 g | Freq: Once | ORAL | Status: DC

## 2011-10-24 MED ORDER — ALBUTEROL SULFATE HFA 108 (90 BASE) MCG/ACT IN AERS: 2.0000 | INHALATION_SPRAY | Freq: Four times a day (QID) | RESPIRATORY_TRACT | Status: DC | PRN

## 2011-10-24 MED ORDER — POLYETHYLENE GLYCOL 3350 17 GM/SCOOP PO POWD: 1.0000 g | Freq: Once | ORAL | Status: DC

## 2011-10-24 NOTE — ED Notes (Signed)
MD at bedside. 

## 2011-10-24 NOTE — ED Provider Notes (Signed)
History  This chart was scribed for Felisa Bonier, MD by Bennett Scrape. This patient was seen in room APA09/APA09 and the patient's care was started at 3:21PM.  CSN: [id number]  Arrival date & time 10/24/11  1152   First MD Initiated Contact with Patient 10/24/11 1511      Chief Complaint  Patient presents with  . Abdominal Pain    The history is provided by the patient. No language interpreter was used.    Brenda Frank is a 41 y.o. female who presents to the Emergency Department complaining of 17 days of gradual onset, gradually worsening, constant, non-radiating abdominal pain with associated constipation, nausea and occasional diaphoresis. She describes the pain as being a stabbing and burning pain located in the right side mid and upper epigastrium. She rates that pain an 8 out of 10 currently. She reports that eating makes the pain worse and heat with pressure makes the pain better. She reports that she is having bowel movements after laxative use but that the bowel movements are smaller and thinner than normal. At baseline she has one bowel movement a day but since the onset of symptoms she reports have a bowel movement Feburary 9th, 14th, 23rd, and 25th. She was seen in this ED and diagnosed with constipation through CT scan. She states that she followed her discharge instructions and is still having pain. She was seen by her PCP on Feburary 18th and was referred to gastrodentrologist Dr. Karilyn Cota. She states that she has an appointment with Dr. Karilyn Cota for this Thursday but was sent to this ED by her PCP for further evaluation and a gastroenterology consultation. She has a h/o diverticulosis, diabetes and HTN but no h/o constipation. She is a current everyday smoker but denies alcohol use.    Past Medical History  Diagnosis Date  . Cancer   . Diabetes mellitus   . Hypertension   . Asthma   . COPD (chronic obstructive pulmonary disease)   . Diverticulosis     Past Surgical  History  Procedure Date  . Abdominal hysterectomy   . Cholecystectomy   . Hernia repair   . Abdominal surgery   . Appendectomy     History reviewed. No pertinent family history.  History  Substance Use Topics  . Smoking status: Current Everyday Smoker -- 0.5 packs/day    Types: Cigarettes  . Smokeless tobacco: Not on file  . Alcohol Use: No     Review of Systems  Constitutional: Negative for fever and chills.  HENT: Negative for congestion, sore throat and neck pain.   Eyes: Negative for pain.  Respiratory: Negative for cough and shortness of breath.   Cardiovascular: Negative for chest pain.  Gastrointestinal: Positive for nausea, abdominal pain and constipation. Negative for diarrhea and blood in stool.  Genitourinary: Negative for dysuria and hematuria.  Musculoskeletal: Negative for back pain.  Skin: Negative for rash.  Neurological: Negative for numbness and headaches.    Allergies  Haldol  Home Medications   Current Outpatient Rx  Name Route Sig Dispense Refill  . ALBUTEROL SULFATE (5 MG/ML) 0.5% IN NEBU Nebulization Take 0.5 mLs (2.5 mg total) by nebulization every 6 (six) hours as needed for wheezing. 20 mL 12  . ALBUTEROL SULFATE HFA 108 (90 BASE) MCG/ACT IN AERS Inhalation Inhale 2 puffs into the lungs every 6 (six) hours as needed. For RESCUE    . ASENAPINE MALEATE 10 MG SL SUBL Sublingual Place 1 tablet under the tongue 2 (two) times  daily.    Marland Kitchen BISACODYL 5 MG PO TBEC Oral Take 15 mg by mouth once as needed.    Marland Kitchen CLONAZEPAM 2 MG PO TABS Oral Take 2 mg by mouth 2 (two) times daily as needed. For anxiety    . DOCUSATE SODIUM 100 MG PO CAPS Oral Take 300 mg by mouth daily.    Marland Kitchen HYDROCODONE-ACETAMINOPHEN 10-325 MG PO TABS Oral Take 1 tablet by mouth every 6 (six) hours as needed. For pain    . IBUPROFEN 200 MG PO TABS Oral Take 800 mg by mouth as needed. For pain    . MAGNESIUM CITRATE PO SOLN Oral Take 296 mLs by mouth once. 300 mL 0  . OLMESARTAN MEDOXOMIL  40 MG PO TABS Oral Take 40 mg by mouth daily.    Marland Kitchen POLYETHYLENE GLYCOL 3350 PO POWD Oral Take 17 g by mouth daily.    Marland Kitchen PREGABALIN 75 MG PO CAPS Oral Take 75 mg by mouth 3 (three) times daily.    Marland Kitchen FLEET PEDIATRIC 3.5-9.5 GM/59ML RE ENEM Rectal Place 1 enema rectally once.    Marland Kitchen TIOTROPIUM BROMIDE MONOHYDRATE 18 MCG IN CAPS Inhalation Place 18 mcg into inhaler and inhale daily.      Triage Vitals: BP 150/84  Pulse 118  Temp(Src) 98.2 F (36.8 C) (Oral)  Resp 20  Ht 5\' 6"  (1.676 m)  Wt 196 lb (88.905 kg)  BMI 31.64 kg/m2  SpO2 95%  Physical Exam  Nursing note and vitals reviewed. Constitutional: She is oriented to person, place, and time. She appears well-developed and well-nourished.  HENT:  Head: Normocephalic and atraumatic.  Mouth/Throat: Oropharynx is clear and moist.       Moist mucous membranes, no pharyngeal erythema or edema   Eyes: EOM are normal. Pupils are equal, round, and reactive to light. No scleral icterus.  Neck: Normal range of motion. Neck supple.  Cardiovascular: Normal rate, regular rhythm and normal heart sounds.   Pulmonary/Chest: Effort normal and breath sounds normal. No respiratory distress.       Lungs clear to ausculation bilaterally  Abdominal: Soft. Bowel sounds are normal. She exhibits distension (Mildly). There is tenderness (to pal to RLQ, RUQ and episgastrium ). There is no rebound and no guarding.  Genitourinary: Guaiac negative stool.       Rectum is normal, no fecal impaction noted   Musculoskeletal: Normal range of motion. She exhibits no edema.  Neurological: She is alert and oriented to person, place, and time.  Skin: Skin is warm and dry.  Psychiatric: She has a normal mood and affect. Her behavior is normal.    ED Course  Procedures (including critical care time)  DIAGNOSTIC STUDIES: Oxygen Saturation is 95% on room air, adequate by my interpretation.    COORDINATION OF CARE: 3:42PM-Discussed blood panel and urinalysis with pt and  pt agreed. Will give pt pain medications. Discussed admission to the hospital so that the pt will be evaluated by gastroenterologist sooner. 5:04PM-Discussed x-ray results and pt acknowledged results. Performed rectal exam with pt's permission. Discussed status of admission with pt.  Labs Reviewed  CBC - Abnormal; Notable for the following:    WBC 10.9 (*)    RBC 5.66 (*)    Hemoglobin 17.3 (*)    HCT 51.0 (*)    All other components within normal limits  COMPREHENSIVE METABOLIC PANEL - Abnormal; Notable for the following:    Glucose, Bld 118 (*)    Calcium 10.6 (*)    All other components  within normal limits  URINALYSIS, ROUTINE W REFLEX MICROSCOPIC  DIFFERENTIAL  LIPASE, BLOOD   Dg Abd Acute W/chest  10/24/2011  *RADIOLOGY REPORT*  Clinical Data: Abdominal pain.  ACUTE ABDOMEN SERIES (ABDOMEN 2 VIEW & CHEST 1 VIEW)  Comparison:  CT of abdomen dated 10/07/2011  Findings:  There is no evidence of dilated bowel loops or free intraperitoneal air.  No radiopaque calculi or other significant radiographic abnormality is seen.  Clips are present from prior cholecystectomy.  Heart size and mediastinal contours are within normal limits.  Both lungs are clear.  IMPRESSION: No acute findings.  Original Report Authenticated By: Reola Calkins, M.D.     No diagnosis found.    MDM  Differential diagnosis includes chronic constipation, hepatis, pancreatis, urinary tract infection, diverticulitis, and bowel obstruction. Given the prolonged course of symptoms and lack of results with prolonged at home treatments, I will consult with gastroenterologist for further patient evaluation.       I personally performed the services described in this documentation, which was scribed in my presence. The recorded information has been reviewed and considered.    Felisa Bonier, MD 10/24/11 [phone]

## 2011-10-24 NOTE — ED Notes (Signed)
C/o abdominal pain, seen here for same, was told to take Miralax for stool, + nausea, LBM today-small and thin

## 2011-10-24 NOTE — H&P (Signed)
Hospital Admission Note Date: 10/24/2011  Patient name: Brenda Frank Medical record number: [id number] Date of birth: 1970-09-05 Age: 41 y.o. Gender: female PCP: Dwana Melena, MD, MD  Attending physician: Christiane Ha, MD  Chief Complaint: Abdominal pain  History of Present Illness:  Brenda Frank is an 41 y.o. female who presents with a several week history of worsening right upper quadrant pain and constipation. She is on chronic opiate analgesics but this has not changed recently. She came to the emergency room several times earlier this month. 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 She's had no fevers but she has had night sweats. She has gained 15 pounds recently. Her appetite has not changed. The only way she is able to have bowel movements and is by drinking a bottle of magnesium citrate. She's had several enemas without results. She has been taking MiraLAX, Colace, Dulcolax. She was referred to Dr. Dionicia Abler as an outpatient. The pain has become so unbearable that she went to her primary care physician's office who subsequently sent her to the emergency room. The ED physician spoke with Dr. Karilyn Cota who recommended admission to the hospitalist service and he will consult. I have spoken to him and he recommends a bowel prep tonight and colonoscopy tomorrow.  Past Medical History  Diagnosis Date  . Diabetes mellitus   . Hypertension   . COPD (chronic obstructive pulmonary disease)   . Diverticulosis   . Cancer     skin    Meds: Prescriptions prior to admission  Medication Sig Dispense Refill  . albuterol (PROVENTIL) (5 MG/ML) 0.5% nebulizer solution Take 0.5 mLs (2.5 mg total) by nebulization every 6 (six) hours as needed for wheezing.  20 mL  12  . albuterol (VENTOLIN HFA) 108  (90 BASE) MCG/ACT inhaler Inhale 2 puffs into the lungs every 6 (six) hours as needed. For RESCUE      . Asenapine Maleate (SAPHRIS) 10 MG SUBL Place 1 tablet under the tongue 2 (two) times daily.      . bisacodyl (DULCOLAX) 5 MG EC tablet Take 15 mg by mouth once as needed.      . clonazePAM (KLONOPIN) 2 MG tablet Take 2 mg by mouth 2 (two) times daily as needed. For anxiety      . docusate sodium (COLACE) 100 MG capsule Take 300 mg by mouth daily.      Marland Kitchen HYDROcodone-acetaminophen (NORCO) 10-325 MG per tablet Take 1 tablet by mouth every 6 (six) hours as needed. For pain      . ibuprofen (ADVIL,MOTRIN) 200 MG tablet Take 800 mg by mouth as needed. For pain      . magnesium citrate solution Take 296 mLs by mouth once.  300 mL  0  . olmesartan (BENICAR) 40 MG tablet Take 40 mg by mouth daily.      . polyethylene glycol powder (GLYCOLAX/MIRALAX) powder Take 17 g by mouth daily.      . pregabalin (LYRICA) 75 MG capsule Take 75 mg by mouth 3 (three) times daily.      . sodium phosphate Pediatric (FLEET) 3.5-9.5 GM/59ML enema Place 1 enema rectally once.      . tiotropium (SPIRIVA) 18 MCG inhalation capsule Place 18 mcg into inhaler and inhale daily.        Allergies: Haldol  and Ultram History   Social History  . Marital Status: Married    Spouse Name: N/A    Number of Children: N/A  . Years of Education: N/A   Occupational History  . Not on file.   Social History Main Topics  . Smoking status: Current Everyday Smoker -- 2.0 packs/day    Types: Cigarettes  . Smokeless tobacco: Not on file  . Alcohol Use: No  . Drug Use: No  . Sexually Active:    Other Topics Concern  . Not on file   Social History Narrative  . No narrative on file   History reviewed. No pertinent family history. Past Surgical History  Procedure Date  . Abdominal hysterectomy   . Cholecystectomy   . Hernia repair   . Abdominal surgery   . Appendectomy     Review of Systems: Systems reviewed and as per  HPI, otherwise negative.  Physical Exam: Blood pressure 121/90, pulse 87, temperature 97.9 F (36.6 C), temperature source Oral, resp. rate 20, height 5\' 6"  (1.676 m), weight 88.905 kg (196 lb), SpO2 96.00%. BP 121/90  Pulse 87  Temp(Src) 97.9 F (36.6 C) (Oral)  Resp 20  Ht 5\' 6"  (1.676 m)  Wt 88.905 kg (196 lb)  BMI 31.64 kg/m2  SpO2 96%  General Appearance:    Alert, cooperative, no distress, appears stated age. Talkative   Head:    Normocephalic, without obvious abnormality, atraumatic  Eyes:    PERRL, conjunctiva/corneas clear, EOM's intact, fundi    benign, both eyes  Ears:    Normal TM's and external ear canals, both ears  Nose:   Nares normal, septum midline, mucosa normal, no drainage    or sinus tenderness  Throat:  poor dentition. Slightly dry mucous membranes   Neck:   Supple, symmetrical, trachea midline, no adenopathy;    thyroid:  no enlargement/tenderness/nodules; no carotid   bruit or JVD  Back:     Symmetric, no curvature, ROM normal, no CVA tenderness  Lungs:     Clear to auscultation bilaterally, respirations unlabored  Chest Wall:    No tenderness or deformity   Heart:    Regular rate and rhythm, S1 and S2 normal, no murmur, rub   or gallop     Abdomen:     Soft, right upper quadrant tenderness, lower abdominal tenderness, bowel sounds active all four quadrants,    no masses, no organomegaly  Genitalia:    Normal female without lesion, discharge or tenderness  Rectal:    Normal tone, normal prostate, no masses or tenderness;   guaiac negative stool  Extremities:   Extremities normal, atraumatic, no cyanosis or edema  Pulses:   2+ and symmetric all extremities  Skin:   Skin color, texture, turgor normal, no rashes or lesions  Lymph nodes:   Cervical, supraclavicular, and axillary nodes normal  Neurologic:   CNII-XII intact, normal strength, sensation and reflexes    throughout    Psychiatric: Normal affect.  Lab results: Basic Metabolic  Panel:  Basename 10/24/11 1522 10/21/11 2105  NA 141 139  K 4.1 3.7  CL 101 99  CO2 31 31  GLUCOSE 118* 99  BUN 9 14  CREATININE 0.64 0.76  CALCIUM 10.6* 10.5  MG -- --  PHOS -- --   Liver Function Tests:  Basename 10/24/11 1522 10/21/11 2105  AST 14 17  ALT 15 16  ALKPHOS 91 90  BILITOT 0.3 0.2*  PROT 7.1 6.9  ALBUMIN 3.9 3.7  Basename 10/24/11 1522 10/21/11 2106  LIPASE 17 22  AMYLASE -- --   No results found for this basename: AMMONIA:2 in the last 72 hours CBC:  Basename 10/24/11 1522 10/21/11 2105  WBC 10.9* 11.0*  NEUTROABS 7.4 5.9  HGB 17.3* 16.3*  HCT 51.0* 49.1*  MCV 90.1 90.8  PLT 328 309   Cardiac Enzymes: No results found for this basename: CKTOTAL:3,CKMB:3,CKMBINDEX:3,TROPONINI:3 in the last 72 hours BNP: No results found for this basename: PROBNP:3 in the last 72 hours D-Dimer: No results found for this basename: DDIMER:2 in the last 72 hours CBG: No results found for this basename: GLUCAP:6 in the last 72 hours Hemoglobin A1C: No results found for this basename: HGBA1C in the last 72 hours Fasting Lipid Panel: No results found for this basename: CHOL,HDL,LDLCALC,TRIG,CHOLHDL,LDLDIRECT in the last 72 hours Thyroid Function Tests: No results found for this basename: TSH,T4TOTAL,FREET4,T3FREE,THYROIDAB in the last 72 hours Anemia Panel: No results found for this basename: VITAMINB12,FOLATE,FERRITIN,TIBC,IRON,RETICCTPCT in the last 72 hours Coagulation: No results found for this basename: LABPROT:2,INR:2 in the last 72 hours Urine Drug Screen: Drugs of Abuse     Component Value Date/Time   LABOPIA NONE DETECTED 03/08/2009 1553   COCAINSCRNUR NONE DETECTED 03/08/2009 1553   LABBENZ POSITIVE* 03/08/2009 1553   AMPHETMU NONE DETECTED 03/08/2009 1553   THCU NONE DETECTED 03/08/2009 1553   LABBARB  Value: NONE DETECTED        DRUG SCREEN FOR MEDICAL PURPOSES ONLY.  IF CONFIRMATION IS NEEDED FOR ANY PURPOSE, NOTIFY LAB WITHIN 5 DAYS.        LOWEST  DETECTABLE LIMITS FOR URINE DRUG SCREEN Drug Class       Cutoff (ng/mL) Amphetamine      1000 Barbiturate      200 Benzodiazepine   200 Tricyclics       300 Opiates          300 Cocaine          300 THC              50 03/08/2009 1553    Alcohol Level: No results found for this basename: ETH:2 in the last 72 hours Urinalysis:  Basename 10/24/11 1511 10/21/11 2132  COLORURINE YELLOW YELLOW  LABSPEC 1.015 1.010  PHURINE 7.0 5.5  GLUCOSEU NEGATIVE NEGATIVE  HGBUR NEGATIVE NEGATIVE  BILIRUBINUR NEGATIVE NEGATIVE  KETONESUR NEGATIVE NEGATIVE  PROTEINUR NEGATIVE NEGATIVE  UROBILINOGEN 0.2 0.2  NITRITE NEGATIVE NEGATIVE  LEUKOCYTESUR NEGATIVE TRACE*    Imaging results:  Dg Abd Acute W/chest  10/24/2011  *RADIOLOGY REPORT*  Clinical Data: Abdominal pain.  ACUTE ABDOMEN SERIES (ABDOMEN 2 VIEW & CHEST 1 VIEW)  Comparison:  CT of abdomen dated 10/07/2011  Findings:  There is no evidence of dilated bowel loops or free intraperitoneal air.  No radiopaque calculi or other significant radiographic abnormality is seen.  Clips are present from prior cholecystectomy.  Heart size and mediastinal contours are within normal limits.  Both lungs are clear.  IMPRESSION: No acute findings.  Original Report Authenticated By: Reola Calkins, M.D.    Assessment & Plan: Principal Problem:  *RUQ pain Active Problems:  Constipation  Chronic pain  Tobacco abuse  HYPERTENSION  OBSTRUCTIVE CHRONIC BRONCHITIS WITHOUT EXACERBAT  BIPOLAR AFFECTIVE DISORDER, HX OF  Patient will be placed on observation. Clear liquids tonight then n.p.o. after midnight. GoLYTELY prep starting tonight and to continue tomorrow. Colonoscopy tomorrow. IV fluids. Nicotine patch.  Conall Vangorder L 10/24/2011, 7:48 PM

## 2011-10-24 NOTE — ED Notes (Signed)
Patient transported to X-ray 

## 2011-10-25 ENCOUNTER — Encounter (HOSPITAL_COMMUNITY)
Admission: EM | Disposition: A | Discharge status: Home / Self Care | Payer: Self-pay | Source: Home / Self Care | Attending: Emergency Medicine

## 2011-10-25 ENCOUNTER — Encounter (HOSPITAL_COMMUNITY): Payer: Self-pay | Admitting: Internal Medicine

## 2011-10-25 DIAGNOSIS — R109 Unspecified abdominal pain: Secondary | ICD-10-CM

## 2011-10-25 DIAGNOSIS — K5900 Constipation, unspecified: Secondary | ICD-10-CM

## 2011-10-25 DIAGNOSIS — K5730 Diverticulosis of large intestine without perforation or abscess without bleeding: Secondary | ICD-10-CM

## 2011-10-25 DIAGNOSIS — K6389 Other specified diseases of intestine: Secondary | ICD-10-CM

## 2011-10-25 DIAGNOSIS — D751 Secondary polycythemia: Secondary | ICD-10-CM | POA: Diagnosis present

## 2011-10-25 DIAGNOSIS — K7689 Other specified diseases of liver: Secondary | ICD-10-CM | POA: Diagnosis present

## 2011-10-25 HISTORY — PX: COLONOSCOPY: SHX5424

## 2011-10-25 HISTORY — DX: Secondary polycythemia: D75.1

## 2011-10-25 SURGERY — COLONOSCOPY
Anesthesia: Moderate Sedation

## 2011-10-25 MED ORDER — MEPERIDINE HCL 50 MG/ML IJ SOLN
INTRAMUSCULAR | Status: AC
  Filled 2011-10-25: qty 1

## 2011-10-25 MED ORDER — MIDAZOLAM HCL 5 MG/5ML IJ SOLN
INTRAMUSCULAR | Status: AC
  Filled 2011-10-25: qty 5

## 2011-10-25 MED ORDER — MEPERIDINE HCL 50 MG/ML IJ SOLN
INTRAMUSCULAR | Status: DC | PRN
  Administered 2011-10-25 (×2): 25 mg via INTRAVENOUS

## 2011-10-25 MED ORDER — MIDAZOLAM HCL 5 MG/5ML IJ SOLN
INTRAMUSCULAR | Status: DC | PRN
  Administered 2011-10-25: 3 mg via INTRAVENOUS
  Administered 2011-10-25: 2 mg via INTRAVENOUS
  Administered 2011-10-25 (×2): 3 mg via INTRAVENOUS
  Administered 2011-10-25: 1 mg via INTRAVENOUS
  Administered 2011-10-25: 3 mg via INTRAVENOUS

## 2011-10-25 MED ORDER — MIDAZOLAM HCL 5 MG/5ML IJ SOLN
INTRAMUSCULAR | Status: AC
  Filled 2011-10-25: qty 10

## 2011-10-25 NOTE — Op Note (Signed)
COLONOSCOPY PROCEDURE REPORT  PATIENT:  Brenda Frank  MR#:  [id number] Birthdate:  June 05, 1971, 41 y.o., female Endoscopist:  Dr. Malissa Hippo, MD Referred By:  Dr. Catalina Pizza, MD Procedure Date: 10/25/2011  Procedure:   Colonoscopy  Indications:  Patient is a 41 year old Caucasian female with acute onset of right-sided abdominal pain and constipation not responding to usual therapy. She is undergoing diagnostic colonoscopy.  Informed Consent:  Procedure and risks were reviewed with the patient and informed consent was obtained.  Medications:  Demerol 50 mg IV Versed 15 mg IV  Description of procedure:  After a digital rectal exam was performed, that colonoscope was advanced from the anus through the rectum and colon to the area of the cecum, ileocecal valve and appendiceal orifice. The cecum was deeply intubated. These structures were well-seen and photographed for the record. From the level of the cecum and ileocecal valve, the scope was slowly and cautiously withdrawn. The mucosal surfaces were carefully surveyed utilizing scope tip to flexion to facilitate fold flattening as needed. The scope was pulled down into the rectum where a thorough exam including retroflexion was performed.  Findings:   Prep satisfactory. 4 mm ulcer noted at ileocecal valve. There was edema and erythema to the surrounding mucosa. Biopsy taken from this area. Few diverticula at the cecum and ascending colon and a few at the descending colon. Normal rectal mucosa and ano-rectal junction  Therapeutic/Diagnostic Maneuvers Performed:  See above  Complications:  None  Cecal Withdrawal Time:  13 minutes  Impression:  Examination performed to cecum. Few diverticuli descending, ascending colon and one at cecum. Single small ulcer at ileocecal valve and edema and erythema to the surrounding mucosa. Biopsy taken from this area. Stool sample and for culture and O&P.  Recommendations:   Advance diet. Await  results of stool studies and biopsy.  Ambika Zettlemoyer U  10/25/2011 2:09 PM  CC: Dr. Dwana Melena, MD, MD & Dr. Bonnetta Barry ref. provider found

## 2011-10-25 NOTE — Progress Notes (Signed)
UR Chart Review Completed  

## 2011-10-25 NOTE — Progress Notes (Signed)
Subjective: The patient says that she has 6/10 right-sided abdominal pain. She has had some nausea but no vomiting. She has a global headache. The abdominal pain and headache have been relieved with analgesics as ordered.  Objective: Vital signs in last 24 hours: Filed Vitals:   10/24/11 1605 10/24/11 1843 10/24/11 2053 10/25/11 0602  BP: 124/75 121/90 113/77 145/90  Pulse: 82 87 84 99  Temp: 98.3 F (36.8 C) 97.9 F (36.6 C) 98.2 F (36.8 C) 98.2 F (36.8 C)  TempSrc: Oral Oral Oral Oral  Resp: 18 20 20 20   Height:      Weight:      SpO2: 96% 96% 91% 89%    Intake/Output Summary (Last 24 hours) at 10/25/11 1113 Last data filed at 10/25/11 0900  Gross per 24 hour  Intake      0 ml  Output      0 ml  Net      0 ml    Weight change:   Physical exam: Lungs: Clear to auscultation bilaterally. Heart: S1, S2, with mild tachycardia. Abdomen: Mildly obese, soft, mild right lower quadrant tenderness, room no rebound, no distention, no guarding. Extremity is: No pedal edema.  Lab Results: Basic Metabolic Panel:  Basename 10/24/11 1522  NA 141  K 4.1  CL 101  CO2 31  GLUCOSE 118*  BUN 9  CREATININE 0.64  CALCIUM 10.6*  MG --  PHOS --   Liver Function Tests:  [facility] 10/24/11 1522  AST 14  ALT 15  ALKPHOS 91  BILITOT 0.3  PROT 7.1  ALBUMIN 3.9    Basename 10/24/11 1522  LIPASE 17  AMYLASE --   No results found for this basename: AMMONIA:2 in the last 72 hours CBC:  Basename 10/24/11 1522  WBC 10.9*  NEUTROABS 7.4  HGB 17.3*  HCT 51.0*  MCV 90.1  PLT 328   Cardiac Enzymes: No results found for this basename: CKTOTAL:3,CKMB:3,CKMBINDEX:3,TROPONINI:3 in the last 72 hours BNP: No results found for this basename: PROBNP:3 in the last 72 hours D-Dimer: No results found for this basename: DDIMER:2 in the last 72 hours CBG: No results found for this basename: GLUCAP:6 in the last 72 hours Hemoglobin A1C: No results found for this basename: HGBA1C in  the last 72 hours Fasting Lipid Panel: No results found for this basename: CHOL,HDL,LDLCALC,TRIG,CHOLHDL,LDLDIRECT in the last 72 hours Thyroid Function Tests: No results found for this basename: TSH,T4TOTAL,FREET4,T3FREE,THYROIDAB in the last 72 hours Anemia Panel: No results found for this basename: VITAMINB12,FOLATE,FERRITIN,TIBC,IRON,RETICCTPCT in the last 72 hours Coagulation: No results found for this basename: LABPROT:2,INR:2 in the last 72 hours Urine Drug Screen: Drugs of Abuse     Component Value Date/Time   LABOPIA NONE DETECTED 03/08/2009 1553   COCAINSCRNUR NONE DETECTED 03/08/2009 1553   LABBENZ POSITIVE* 03/08/2009 1553   AMPHETMU NONE DETECTED 03/08/2009 1553   THCU NONE DETECTED 03/08/2009 1553   LABBARB  Value: NONE DETECTED        DRUG SCREEN FOR MEDICAL PURPOSES ONLY.  IF CONFIRMATION IS NEEDED FOR ANY PURPOSE, NOTIFY LAB WITHIN 5 DAYS.        LOWEST DETECTABLE LIMITS FOR URINE DRUG SCREEN Drug Class       Cutoff (ng/mL) Amphetamine      1000 Barbiturate      200 Benzodiazepine   200 Tricyclics       300 Opiates          300 Cocaine  300 THC              50 03/08/2009 1553    Alcohol Level: No results found for this basename: ETH:2 in the last 72 hours Urinalysis:  Basename 10/24/11 1511  COLORURINE YELLOW  LABSPEC 1.015  PHURINE 7.0  GLUCOSEU NEGATIVE  HGBUR NEGATIVE  BILIRUBINUR NEGATIVE  KETONESUR NEGATIVE  PROTEINUR NEGATIVE  UROBILINOGEN 0.2  NITRITE NEGATIVE  LEUKOCYTESUR NEGATIVE   Misc. Labs:   Micro: No results found for this or any previous visit (from the past 240 hour(s)).  Studies/Results: Dg Abd Acute W/chest  10/24/2011  *RADIOLOGY REPORT*  Clinical Data: Abdominal pain.  ACUTE ABDOMEN SERIES (ABDOMEN 2 VIEW & CHEST 1 VIEW)  Comparison:  CT of abdomen dated 10/07/2011  Findings:  There is no evidence of dilated bowel loops or free intraperitoneal air.  No radiopaque calculi or other significant radiographic abnormality is seen.  Clips  are present from prior cholecystectomy.  Heart size and mediastinal contours are within normal limits.  Both lungs are clear.  IMPRESSION: No acute findings.  Original Report Authenticated By: Reola Calkins, M.D.    Medications: I have reviewed the patient's current medications.  Assessment: Principal Problem:  *RUQ pain Active Problems:  HYPERTENSION  OBSTRUCTIVE CHRONIC BRONCHITIS WITHOUT EXACERBAT  BIPOLAR AFFECTIVE DISORDER, HX OF  Constipation  Chronic pain  Tobacco abuse  Hepatic cyst  Polycythemia   Gastroenterologist, Dr. Dionicia Abler has evaluated the patient. He plans a colonoscopy this afternoon. Her pain appears to have subsided but not completely resolved. Now that she is not constipated anymore, this may be helping.  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 She smokes 2 packs of cigarettes daily. A nicotine patch has been placed. We'll order tobacco cessation counseling. She was encouraged to stop smoking completely.  Plan:  1. Continue supportive treatment and IV fluid hydration. 2. Colonoscopy pending. 3. We'll order labs in the morning including TSH, CBC, BMET. 4. Tobacco cessation counseling.    LOS: 1 day   Kaydn Kumpf 10/25/2011, 11:13 AM
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
## 2011-10-25 NOTE — Progress Notes (Signed)
Tobacco Cessation Teaching done. Handouts given and at bedside.

## 2011-10-25 NOTE — Consult Note (Signed)
Patient evaluated for right-sided abdominal pain and acute onset of constant not responding to usual therapies. Patient will undergo diagnostic colonoscopy later today. Please see dictated consultation note for details. Job [id number].

## 2011-10-26 ENCOUNTER — Encounter (HOSPITAL_COMMUNITY): Payer: Self-pay | Admitting: Internal Medicine

## 2011-10-26 DIAGNOSIS — K633 Ulcer of intestine: Secondary | ICD-10-CM

## 2011-10-26 DIAGNOSIS — K5790 Diverticulosis of intestine, part unspecified, without perforation or abscess without bleeding: Secondary | ICD-10-CM

## 2011-10-26 HISTORY — DX: Diverticulosis of intestine, part unspecified, without perforation or abscess without bleeding: K57.90

## 2011-10-26 HISTORY — DX: Ulcer of intestine: K63.3

## 2011-10-26 LAB — BASIC METABOLIC PANEL
CO2: 30 mEq/L (ref 19–32)
Calcium: 9.1 mg/dL (ref 8.4–10.5)
Chloride: 103 mEq/L (ref 96–112)
Glucose, Bld: 114 mg/dL — ABNORMAL HIGH (ref 70–99)
Potassium: 3.8 mEq/L (ref 3.5–5.1)
Sodium: 138 mEq/L (ref 135–145)

## 2011-10-26 LAB — CBC
Hemoglobin: 14.3 g/dL (ref 12.0–15.0)
MCH: 29.4 pg (ref 26.0–34.0)
RBC: 4.87 MIL/uL (ref 3.87–5.11)
WBC: 8.2 10*3/uL (ref 4.0–10.5)

## 2011-10-26 LAB — TSH: TSH: 0.181 u[IU]/mL — ABNORMAL LOW (ref 0.350–4.500)

## 2011-10-26 LAB — OVA AND PARASITE EXAMINATION: Ova and parasites: NONE SEEN

## 2011-10-26 LAB — T4, FREE: Free T4: 1.35 ng/dL (ref 0.80–1.80)

## 2011-10-26 MED ORDER — OXYCODONE-ACETAMINOPHEN 7.5-325 MG PO TABS: 1.0000 | ORAL_TABLET | ORAL | Status: AC | PRN

## 2011-10-26 MED ORDER — ONDANSETRON HCL 4 MG PO TABS: 4.0000 mg | ORAL_TABLET | Freq: Four times a day (QID) | ORAL | Status: AC | PRN

## 2011-10-26 MED ORDER — SENNOSIDES-DOCUSATE SODIUM 8.6-50 MG PO TABS: 1.0000 | ORAL_TABLET | Freq: Two times a day (BID) | ORAL | Status: DC

## 2011-10-26 MED ORDER — HYDROCODONE-ACETAMINOPHEN 10-325 MG PO TABS: 1.0000 | ORAL_TABLET | Freq: Four times a day (QID) | ORAL | Status: DC | PRN

## 2011-10-26 NOTE — Discharge Summary (Addendum)
Physician Discharge Summary  SUVI ARCHULETTA MRN: [id number] DOB/AGE: [birth year] 41 y.o.  PCP: Dwana Melena, MD, MD   Admit date: 10/24/2011 Discharge date: 10/26/2011  Discharge Diagnoses:  1. Ileocecal valve ulcer, 4 millimeters, with surrounding edema and erythema per colonoscopy by Dr. Karilyn Cota. Results of biopsy and stool culture pending at the time of discharge. 2. Diverticulosis per colonoscopy. 3. Probable hepatic cyst per CT of the abdomen and pelvis. LFTs were within normal limits. 4. 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 The patient's hemoglobin was 17.3 on admission and 14.3 at time of discharge. 5. Hypertension. Stable. 6. Chronic constipation.  7. Tobacco abuse. 8. Chronic obstructive bronchitis without exacerbation. 10. Bipolar disorder. Stable.   Medication List  As of 10/26/2011  4:18 PM   STOP taking these medications         albuterol (5 MG/ML) 0.5% nebulizer solution      docusate sodium 100 MG capsule      HYDROcodone-acetaminophen 10-325 MG per tablet      ibuprofen 200 MG tablet         TAKE these medications         clonazePAM 2 MG tablet   Commonly known as: KLONOPIN   Take 2 mg by mouth 2 (two) times daily as needed. For anxiety      olmesartan 40 MG tablet   Commonly known as: BENICAR   Take 40 mg by mouth daily.      ondansetron 4 MG tablet   Commonly known as: ZOFRAN   Take 1 tablet (4 mg total) by mouth every 6 (six) hours as needed for nausea.      oxyCODONE-acetaminophen 7.5-325 MG per tablet   Commonly known as: PERCOCET   Take 1 tablet by mouth every 4 (four) hours as needed for pain.      polyethylene glycol powder powder   Commonly known as: GLYCOLAX/MIRALAX   Take 17 g by mouth daily.      pregabalin 75 MG capsule   Commonly known as: LYRICA   Take 75 mg by mouth 3 (three) times daily.      SAPHRIS 10 MG Subl   Generic drug: Asenapine Maleate   Place 1 tablet under the tongue  2 (two) times daily.      senna-docusate 8.6-50 MG per tablet   Commonly known as: Senokot-S   Take 1 tablet by mouth 2 (two) times daily.      tiotropium 18 MCG inhalation capsule   Commonly known as: SPIRIVA   Place 18 mcg into inhaler and inhale daily.      VENTOLIN HFA 108 (90 BASE) MCG/ACT inhaler   Generic drug: albuterol   Inhale 2 puffs into the lungs every 6 (six) hours as needed. For RESCUE      albuterol (2.5 MG/3ML) 0.083% nebulizer solution   Commonly known as: PROVENTIL   Take 2.5 mg by nebulization every 6 (six) hours as needed. For shortness of breath            Discharge Condition: Improved and stable.  Disposition: 01-Home or Self Care   Consults: Lionel December, M.D.   Significant Diagnostic Studies: Ct Abdomen Pelvis W Contrast  10/07/2011  *RADIOLOGY REPORT*  Clinical Data: Right lower quadrant pain for 10 days.  History of hernia repair, gallbladder surgery, appendectomy, hysterectomy. Diabetes, hypertension, asthma, COPD.  CT ABDOMEN AND PELVIS WITH CONTRAST  Technique:  Multidetector CT imaging of the abdomen and pelvis was performed following the  standard protocol during bolus administration of intravenous contrast.  Contrast: OMNIPAQUE IOHEXOL 300 MG/ML IV SOLN, 40mL OMNIPAQUE IOHEXOL 300 MG/ML IV SOLN  Comparison: 08/27/2009  Findings: The lung bases are unremarkable in appearance.  Left hepatic low density lesion is 11 mm in diameter.  No focal abnormality identified within the spleen, pancreas, adrenal glands, or kidneys.  The stomach is distended with debris and contrast. Small bowel loops have a normal appearance.  There is moderate stool throughout the colon which otherwise has a normal appearance. The appendix is surgically absent.  The gallbladder is surgically absent. No retroperitoneal or mesenteric adenopathy. No evidence for aortic aneurysm.  The uterus is surgically absent.  No evidence for adnexal mass or free pelvic fluid.  There are mild  degenerative changes in the spine.  IMPRESSION:  1.  Postoperative changes. 2.  Small left hepatic lobe lesion, likely representing a cyst. 3.  No evidence for bowel obstruction. 4.  Moderate stool throughout colon.  Original Report Authenticated By: Patterson Hammersmith, M.D.   Dg Abd Acute W/chest  10/24/2011  *RADIOLOGY REPORT*  Clinical Data: Abdominal pain.  ACUTE ABDOMEN SERIES (ABDOMEN 2 VIEW & CHEST 1 VIEW)  Comparison:  CT of abdomen dated 10/07/2011  Findings:  There is no evidence of dilated bowel loops or free intraperitoneal air.  No radiopaque calculi or other significant radiographic abnormality is seen.  Clips are present from prior cholecystectomy.  Heart size and mediastinal contours are within normal limits.  Both lungs are clear.  IMPRESSION: No acute findings.  Original Report Authenticated By: Reola Calkins, M.D.    Microbiology: Recent Results (from the past 240 hour(s))  STOOL CULTURE     Status: Normal (Preliminary result)   Collection Time   10/25/11  2:07 PM      Component Value Range Status Comment   Specimen Description STOOL   Final    Special Requests NONE   Final    Culture Culture reincubated for better growth   Final    Report Status PENDING   Incomplete      Labs: Results for orders placed during the hospital encounter of 10/24/11 (from the past 48 hour(s))  STOOL CULTURE     Status: Normal (Preliminary result)   Collection Time   10/25/11  2:07 PM      Component Value Range Comment   Specimen Description STOOL      Special Requests NONE      Culture Culture reincubated for better growth      Report Status PENDING     CBC     Status: Normal   Collection Time   10/26/11  5:30 AM      Component Value Range Comment   WBC 8.2  4.0 - 10.5 (K/uL)    RBC 4.87  3.87 - 5.11 (MIL/uL)    Hemoglobin 14.3  12.0 - 15.0 (g/dL)    HCT 56.2  13.0 - 86.5 (%)    MCV 92.0  78.0 - 100.0 (fL)    MCH 29.4  26.0 - 34.0 (pg)    MCHC 31.9  30.0 - 36.0 (g/dL)    RDW 78.4   69.6 - 29.5 (%)    Platelets 251  150 - 400 (K/uL) DELTA CHECK NOTED  BASIC METABOLIC PANEL     Status: Abnormal   Collection Time   10/26/11  5:30 AM      Component Value Range Comment   Sodium 138  135 - 145 (mEq/L)  Potassium 3.8  3.5 - 5.1 (mEq/L)    Chloride 103  96 - 112 (mEq/L)    CO2 30  19 - 32 (mEq/L)    Glucose, Bld 114 (*) 70 - 99 (mg/dL)    BUN 10  6 - 23 (mg/dL)    Creatinine, Ser 1.61  0.50 - 1.10 (mg/dL)    Calcium 9.1  8.4 - 10.5 (mg/dL)    GFR calc non Af Amer >90  >90 (mL/min)    GFR calc Af Amer >90  >90 (mL/min)      HPI : The patient is a 41 year old woman with a past medical history significant for hypertension, bipolar disorder, and COPD, who presented to the emergency department on 10/24/2011 with a chief complaint of right-sided abdominal pain. In the emergency department, she was hemodynamically stable and afebrile. Her liver transaminases and lipase were within normal limits. Her hemoglobin was elevated at 17.3. Her WBC was 10.9. Her urinalysis was unremarkable. Her acute abdominal series revealed no acute findings. She was admitted for further evaluation and management.  HOSPITAL COURSE: The patient was started on supportive treatment with as needed analgesics and as needed antiemetics IV. Gastroenterologist, Dr. Karilyn Cota, was consulted. Following his evaluation, he recommended further investigation of her pain with a colonoscopy. The colonoscopy was performed successfully on 10/25/2011. The results were significant for a 4 mm ulcer at the ileocecal valve with surrounding edema and erythema to the surrounding mucosa. Biopsy was taken from this area. It also revealed a few diverticula at the cecum and ascending colon and a few at the descending colon. Stool samples were obtained for culture and O&P study. The patient's diet was advanced. She tolerated the advancement well. She continued to have some right-sided abdominal pain, but it was more bearable. She has a  history of constipation. She she had perennial bowel movements following the GoLYTELY prep for the colonoscopy.   The patient's hemoglobin was elevated, ranging from 17.3 on admission to 14.3 at the time of discharge. His secondary polycythemia was thought to be secondary to chronic tobacco use and possibly chronic mild underlying hypoxia which did not require home oxygen supplementation. She may have also been volume depleted. She was treated with IV fluids which did seem to improve her hemoglobin prior to discharge. She was counseled on tobacco cessation counseling. Nicotine replacement therapy was given during the hospitalization. She was encouraged to stop smoking for which she is trying.  The patient was advised to take daily laxatives. She was informed that Dr. Karilyn Cota will call her with the results of the biopsy and stool studies.    Discharge Exam: Blood pressure 96/60, pulse 88, temperature 98.3 F (36.8 C), temperature source Oral, resp. rate 18, height 5\' 6"  (1.676 m), weight 88.905 kg (196 lb), SpO2 93.00%.  Lungs: Clear to auscultation bilaterally. Heart: S1, S2, with no murmurs rubs or gallops. Abdomen: Positive bowel sounds, soft, mildly tender at the right lower quadrant, no rebound, no distention, no guarding. Extremities: No pedal edema.   Discharge Orders    Future Appointments: Provider: Department: Dept Phone: Center:   10/27/2011 3:00 PM Llana Aliment, NP [facility]. Lionel December [phone] None     Future Orders Please Complete By Expires   Diet - low sodium heart healthy      Increase activity slowly      Discharge instructions      Comments:   FOLLOW A LOW FAT DIET. FURTHER RECOMMENDATIONS PER DR. Karilyn Cota. DR. Karilyn Cota WILL CALL YOU WITH  YOUR TEST RESULTS.      Follow-up Information    Follow up with [facility], [place]. Schedule an appointment as soon as possible for a visit in 2 weeks.   Contact information:   [address] [place]  [address] [phone]       Follow up with Malissa Hippo, MD. (Dr Karilyn Cota will call you with the test results .)    Contact information:   [address], [address] [place] [address] [phone]          Total discharge time: 35 minutes.    Signed: Clydean Posas 10/26/2011, 4:18 PM

## 2011-10-27 ENCOUNTER — Ambulatory Visit (INDEPENDENT_AMBULATORY_CARE_PROVIDER_SITE_OTHER): Payer: 59 | Admitting: Internal Medicine

## 2011-10-29 LAB — STOOL CULTURE

## 2011-11-01 ENCOUNTER — Encounter (HOSPITAL_COMMUNITY): Payer: Self-pay | Admitting: Internal Medicine

## 2011-11-01 ENCOUNTER — Ambulatory Visit (INDEPENDENT_AMBULATORY_CARE_PROVIDER_SITE_OTHER): Payer: 59 | Admitting: Internal Medicine

## 2011-11-14 ENCOUNTER — Other Ambulatory Visit (HOSPITAL_COMMUNITY): Payer: Self-pay | Admitting: Neurological Surgery

## 2011-11-14 DIAGNOSIS — M545 Low back pain: Secondary | ICD-10-CM

## 2011-11-15 ENCOUNTER — Ambulatory Visit (HOSPITAL_COMMUNITY)
Admission: RE | Admit: 2011-11-15 | Discharge: 2011-11-15 | Disposition: A | Discharge status: Home / Self Care | Payer: 59 | Source: Ambulatory Visit | Attending: Neurological Surgery | Admitting: Neurological Surgery

## 2011-11-15 ENCOUNTER — Other Ambulatory Visit (HOSPITAL_COMMUNITY): Payer: Self-pay | Admitting: Neurological Surgery

## 2011-11-15 DIAGNOSIS — M545 Low back pain, unspecified: Secondary | ICD-10-CM | POA: Insufficient documentation

## 2011-11-15 DIAGNOSIS — M5144 Schmorl's nodes, thoracic region: Secondary | ICD-10-CM | POA: Insufficient documentation

## 2011-11-15 DIAGNOSIS — M79609 Pain in unspecified limb: Secondary | ICD-10-CM | POA: Insufficient documentation

## 2011-11-15 DIAGNOSIS — M5126 Other intervertebral disc displacement, lumbar region: Secondary | ICD-10-CM | POA: Insufficient documentation

## 2011-11-16 ENCOUNTER — Other Ambulatory Visit (HOSPITAL_COMMUNITY): Payer: 59

## 2011-11-21 ENCOUNTER — Ambulatory Visit (INDEPENDENT_AMBULATORY_CARE_PROVIDER_SITE_OTHER): Payer: 59 | Admitting: Internal Medicine

## 2011-11-24 ENCOUNTER — Encounter (INDEPENDENT_AMBULATORY_CARE_PROVIDER_SITE_OTHER): Payer: Self-pay | Admitting: *Deleted

## 2012-01-10 ENCOUNTER — Encounter (HOSPITAL_COMMUNITY): Payer: Self-pay | Admitting: Pulmonary Disease

## 2012-01-10 LAB — PULMONARY FUNCTION TEST

## 2012-02-28 ENCOUNTER — Ambulatory Visit (INDEPENDENT_AMBULATORY_CARE_PROVIDER_SITE_OTHER): Payer: 59 | Admitting: Internal Medicine

## 2012-02-28 ENCOUNTER — Encounter (INDEPENDENT_AMBULATORY_CARE_PROVIDER_SITE_OTHER): Payer: Self-pay | Admitting: Internal Medicine

## 2012-02-28 VITALS — BP 110/78 | HR 80 | Temp 97.9°F | Resp 20 | Ht 65.0 in | Wt 215.6 lb

## 2012-02-28 DIAGNOSIS — K922 Gastrointestinal hemorrhage, unspecified: Secondary | ICD-10-CM | POA: Insufficient documentation

## 2012-02-28 DIAGNOSIS — R109 Unspecified abdominal pain: Secondary | ICD-10-CM

## 2012-02-28 LAB — CBC
Hemoglobin: 16.5 g/dL — ABNORMAL HIGH (ref 12.0–15.0)
RBC: 5.54 MIL/uL — ABNORMAL HIGH (ref 3.87–5.11)
WBC: 9.5 10*3/uL (ref 4.0–10.5)

## 2012-02-28 MED ORDER — DICYCLOMINE HCL 10 MG PO CAPS: 10.0000 mg | ORAL_CAPSULE | Freq: Three times a day (TID) | ORAL | Status: DC | PRN

## 2012-02-28 MED ORDER — PANTOPRAZOLE SODIUM 40 MG PO TBEC: 40.0000 mg | DELAYED_RELEASE_TABLET | Freq: Every day | ORAL | Status: DC

## 2012-02-28 NOTE — Progress Notes (Signed)
Presenting complaint;  Melena and right-sided abdominal pain.  Subjective:  Patient is 41 year old Caucasian female who is in for scheduled visit. She was last seen in February 2013 when she was hospitalized with severe right-sided abdominal pain and constipation. Colonoscopy reveals ulcerative ileocecal valve felt to be due to NSAID use. Regarding her back pain she is seeing Dr. Yetta Barre and undergoing evaluation. About one month ago she decided to go back on ibuprofen and over the weekend she noted tarry stools lasting for 2 days. She is not having constipation anymore but she has developed postprandial bowel movement. Most of her stools or soft and sometimes loose. She also complains of right-sided abdominal pain the same pain she had back in February 2013. She denies postural lightheadedness she also denies epigastric pain nausea and vomiting. She is still smoking.  Current Medications: Current Outpatient Prescriptions  Medication Sig Dispense Refill  . albuterol (PROVENTIL) (2.5 MG/3ML) 0.083% nebulizer solution Take 2.5 mg by nebulization every 6 (six) hours as needed. For shortness of breath      . albuterol (VENTOLIN HFA) 108 (90 BASE) MCG/ACT inhaler Inhale 2 puffs into the lungs every 6 (six) hours as needed. For RESCUE      . clonazePAM (KLONOPIN) 2 MG tablet Take 2 mg by mouth 2 (two) times daily as needed. For anxiety      . hydrochlorothiazide (HYDRODIURIL) 25 MG tablet Take 25 mg by mouth as needed.      Marland Kitchen HYDROcodone-acetaminophen (NORCO) 10-325 MG per tablet Take 2 tablets by mouth every 8 (eight) hours as needed.       . lithium carbonate 300 MG capsule Take 300 mg by mouth 2 (two) times daily. 2 capsules in the morning and 3 capsules at night      . pregabalin (LYRICA) 75 MG capsule Take 75 mg by mouth 3 (three) times daily.      Marland Kitchen telmisartan (MICARDIS) 80 MG tablet Take 80 mg by mouth daily.      Marland Kitchen tiotropium (SPIRIVA) 18 MCG inhalation capsule Place 18 mcg into inhaler and  inhale daily.      Marland Kitchen dicyclomine (BENTYL) 10 MG capsule Take 1 capsule (10 mg total) by mouth 3 (three) times daily as needed.  90 capsule  5  . pantoprazole (PROTONIX) 40 MG tablet Take 1 tablet (40 mg total) by mouth daily.  30 tablet  5     Objective: Blood pressure 110/78, pulse 80, temperature 97.9 F (36.6 C), temperature source Oral, resp. rate 20, height 5\' 5"  (1.651 m), weight 215 lb 9.6 oz (97.796 kg). Patient is alert and does not appear to be in any distress. Conjunctiva is pink. Sclera is nonicteric Oropharyngeal mucosa is normal. No neck masses or thyromegaly noted. Cardiac exam with regular rhythm normal S1 and S2. No murmur or gallop noted. Lungs are clear to auscultation. Abdomen is full. Bowel sounds are normal. On palpation soft abdomen with tenderness in right mid abdomen without guarding or rebound. No organomegaly or masses. Rectal examination reveals mucus in the rectum which is guaiac negative.  No LE edema or clubbing noted.    Assessment:  #1. Melena and right-sided abdominal pain. Suspect she may be bleeding from ulcerative ileocecal valve initially documented on colonoscopy of February 2013. She could also have developed haptic ulcer disease or small bowel injury secondary to NSAID therapy. She does not have epigastric pain. She is hemodynamically stable. #2. Irritable bowel syndrome.   Plan:  Patient advised not to take any NSAIDs  for at least one week. And if she needs to take NSAIDs she should take ibuprofen no more than 400 mg by mouth 3 times a day when necessary. Pantoprazole 40 mg by mouth every morning. Hyoscyamine 10 mg by mouth a.c. as needed. She will go to the lab for CBC and H. pylori serology. If melena recurs patient to call otherwise return for office visit in 3 months.

## 2012-02-28 NOTE — Patient Instructions (Signed)
Do not take ibuprofen for at least one week. If you do take ibuprofen take no more than 400 mg with meals up to 3 times a day as needed. Dicyclomine 10 mg by mouth 30 minutes before each meal as needed. Pantoprazole 40 mg by mouth 30 minutes before breakfast daily but first dose now Physician will call you with results of blood work

## 2012-02-29 LAB — H. PYLORI ANTIBODY, IGG: H Pylori IgG: <0.4 {ISR}

## 2012-03-08 ENCOUNTER — Telehealth (HOSPITAL_COMMUNITY): Payer: Self-pay

## 2012-03-08 ENCOUNTER — Other Ambulatory Visit: Payer: Self-pay | Admitting: Neurological Surgery

## 2012-03-08 DIAGNOSIS — M549 Dorsalgia, unspecified: Secondary | ICD-10-CM

## 2012-03-08 NOTE — Telephone Encounter (Signed)
I received a fax order from [facility] for Brenda Frank.  I showed it to Dr. Benard Rink.  He advised me to fax to Danielle at the the clinic.  I faxed it to her at [phone].

## 2012-03-22 ENCOUNTER — Encounter (INDEPENDENT_AMBULATORY_CARE_PROVIDER_SITE_OTHER): Payer: Self-pay

## 2012-03-23 ENCOUNTER — Ambulatory Visit
Admission: RE | Admit: 2012-03-23 | Discharge: 2012-03-23 | Disposition: A | Discharge status: Home / Self Care | Payer: 59 | Source: Ambulatory Visit | Attending: Neurological Surgery | Admitting: Neurological Surgery

## 2012-03-23 ENCOUNTER — Other Ambulatory Visit: Payer: Self-pay | Admitting: Neurological Surgery

## 2012-03-23 VITALS — BP 97/47 | HR 72 | Temp 96.9°F | Resp 16

## 2012-03-23 DIAGNOSIS — M549 Dorsalgia, unspecified: Secondary | ICD-10-CM

## 2012-03-23 MED ORDER — SODIUM CHLORIDE 0.9 % IV SOLN
Freq: Once | INTRAVENOUS | Status: AC
  Administered 2012-03-23: 08:00:00 via INTRAVENOUS

## 2012-03-23 MED ORDER — IOHEXOL 180 MG/ML  SOLN
8.0000 mL | Freq: Once | INTRAMUSCULAR | Status: AC | PRN
  Administered 2012-03-23: 8 mL

## 2012-03-23 MED ORDER — CEFAZOLIN SODIUM 1-5 GM-% IV SOLN
1.0000 g | Freq: Once | INTRAVENOUS | Status: AC
  Administered 2012-03-23: 1 g via INTRAVENOUS

## 2012-03-23 MED ORDER — KETOROLAC TROMETHAMINE 30 MG/ML IJ SOLN
30.0000 mg | Freq: Once | INTRAMUSCULAR | Status: AC
  Administered 2012-03-23: 30 mg via INTRAVENOUS

## 2012-03-23 MED ORDER — FENTANYL CITRATE 0.05 MG/ML IJ SOLN
25.0000 ug | INTRAMUSCULAR | Status: DC | PRN
  Administered 2012-03-23: 100 ug via INTRAVENOUS

## 2012-03-23 MED ORDER — MIDAZOLAM HCL 2 MG/2ML IJ SOLN
1.0000 mg | INTRAMUSCULAR | Status: DC | PRN
  Administered 2012-03-23: 1 mg via INTRAVENOUS

## 2012-03-23 MED ORDER — MEPERIDINE HCL 100 MG/ML IJ SOLN
75.0000 mg | Freq: Once | INTRAMUSCULAR | Status: AC
  Administered 2012-03-23: 75 mg via INTRAMUSCULAR

## 2012-03-23 MED ORDER — ONDANSETRON HCL 4 MG/2ML IJ SOLN
4.0000 mg | Freq: Once | INTRAMUSCULAR | Status: AC
  Administered 2012-03-23: 4 mg via INTRAMUSCULAR

## 2012-03-23 NOTE — Progress Notes (Signed)
Pt's lungs are coarse thru out and heart sounds regular.

## 2012-03-30 ENCOUNTER — Other Ambulatory Visit: Payer: Self-pay | Admitting: Neurological Surgery

## 2012-04-18 ENCOUNTER — Encounter (HOSPITAL_COMMUNITY): Payer: Self-pay | Admitting: Respiratory Therapy

## 2012-04-26 NOTE — Pre-Procedure Instructions (Addendum)
20 Brenda Frank   04/26/2012   Your procedure is scheduled on: May 03, 2012, Thursday   Report to [facility] at  5:30 AM.   Call this number if you have problems the morning of surgery: [phone]   Remember:   Do not eat food or drink any liquids:After Midnight  Wednesday .  Take these medicines the morning of surgery with A SIP OF WATER: Inhalers, Spiriva, Protonix,               Norco, Klonopin, Lithium   Do not wear jewelry, make-up or nail polish.  Do not wear lotions, powders, or perfumes. You may NOT wear deodorant.   Ladies: Do not shave 48 hours prior to surgery.  Do not bring valuables to the hospital.   Contacts, dentures or bridgework may not be worn into surgery .  Leave suitcase in the car. After surgery it may be brought to your room.  For patients admitted to the hospital, checkout time is 11:00 AM the day of discharge.   Patients discharged the day of surgery will not be allowed to drive home.  Name and phone number of your driver:Brenda Frank [phone]  partner    Special Instructions: CHG Shower Use Special Wash: 1/2 bottle night before surgery and 1/2 bottle morning of surgery.   Please read over the following fact sheets that you were given: Pain Booklet, Coughing and Deep Breathing, Blood Transfusion Information, MRSA Information and Surgical Site Infection Prevention

## 2012-04-27 ENCOUNTER — Encounter (HOSPITAL_COMMUNITY)
Admission: RE | Admit: 2012-04-27 | Discharge: 2012-04-27 | Disposition: A | Discharge status: Home / Self Care | Payer: 59 | Source: Ambulatory Visit | Attending: Neurological Surgery | Admitting: Neurological Surgery

## 2012-04-27 ENCOUNTER — Other Ambulatory Visit: Payer: Self-pay | Admitting: Neurological Surgery

## 2012-04-27 ENCOUNTER — Encounter (HOSPITAL_COMMUNITY): Payer: Self-pay

## 2012-04-27 HISTORY — DX: Other diseases of vocal cords: J38.3

## 2012-04-27 HISTORY — DX: Pneumonia, unspecified organism: J18.9

## 2012-04-27 HISTORY — DX: Gastro-esophageal reflux disease without esophagitis: K21.9

## 2012-04-27 HISTORY — DX: Unilateral inguinal hernia, without obstruction or gangrene, not specified as recurrent: K40.90

## 2012-04-27 LAB — CBC WITH DIFFERENTIAL/PLATELET
Eosinophils Relative: 2 % (ref 0–5)
HCT: 53 % — ABNORMAL HIGH (ref 36.0–46.0)
Hemoglobin: 17.3 g/dL — ABNORMAL HIGH (ref 12.0–15.0)
Lymphocytes Relative: 28 % (ref 12–46)
Lymphs Abs: 2.8 10*3/uL (ref 0.7–4.0)
MCV: 91.5 fL (ref 78.0–100.0)
Monocytes Absolute: 0.6 10*3/uL (ref 0.1–1.0)
Monocytes Relative: 6 % (ref 3–12)
Neutro Abs: 6.4 10*3/uL (ref 1.7–7.7)
RBC: 5.79 MIL/uL — ABNORMAL HIGH (ref 3.87–5.11)
WBC: 9.9 10*3/uL (ref 4.0–10.5)

## 2012-04-27 LAB — BASIC METABOLIC PANEL
CO2: 29 mEq/L (ref 19–32)
Chloride: 101 mEq/L (ref 96–112)
Creatinine, Ser: 0.68 mg/dL (ref 0.50–1.10)
GFR calc Af Amer: >90 mL/min (ref >90)
Potassium: 4.4 mEq/L (ref 3.5–5.1)
Sodium: 140 mEq/L (ref 135–145)

## 2012-04-27 LAB — SURGICAL PCR SCREEN
MRSA, PCR: NEGATIVE
Staphylococcus aureus: NEGATIVE

## 2012-04-27 LAB — ABO/RH: ABO/RH(D): A POS

## 2012-04-27 NOTE — Progress Notes (Addendum)
ekg , cxr in epic 1/13,  pulm function test 5/13 Office called to verify permit verbage

## 2012-05-01 NOTE — Consult Note (Signed)
Anesthesia chart review: Patient is a 41 year old female scheduled for left L2-3, L3-4, L4-5 anterior lateral retroperitoneal interbody fusion on 05/03/2012 by Dr. Yetta Barre. History includes smoking, obesity, hypertension, IBS (per Dr. Patty Sermons notes), DM2 diet controlled, GERD, COPD, secondary polycythemia "likely due to chronic tobacco use and/or dehydration", pneumonia, hypertension, skin cancer, Bipolar disorder, right inguinal hernia, 4 mm ileocecal valve ulcer seen on colonoscopy 10/25/11 (Dr. Lionel December), diverticulosis, hysterectomy '08.  PCP is Dr. Dwana Melena.  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  She has since undergone uneventful GA via ETT for BSO on 09/01/08 (anesthesia record on chart).    EKG on 09/13/11 showed NSR.  CXR on 09/13/11 showed no active cardiopulmonary disease.  PFTs on 10/13/11 showed FEV 2.07 (67% predicted).  The interpretation by Dr. Kari Baars included: 1. Spirometry shows a mild ventilatory defect with evidence of airflow obstruction that is most marked in the smaller airways.  2. Lung volumes show no evidence of restrictive change but do show air trapping.  3. DLCO is mildly reduced and does correct some for volume.  4. Airway resistance appears to be increased.  5. There was no significant bronchodilator improvement.  Currently her COPD meds include Proventil MDI/nebs PRN and Spiriva.  Labs noted.  H/H 17.3/53.0.  PLT 253. PT/INR WNL. Her Hgb has primarily been in the 16 range this year--also up to 17.3 on 10/24/11.  If no acute pulmonary symptoms, then anticipate she can proceed as planned.  Shonna Chock, PA-C

## 2012-05-02 MED ORDER — CEFAZOLIN SODIUM-DEXTROSE 2-3 GM-% IV SOLR
2.0000 g | INTRAVENOUS | Status: DC
  Filled 2012-05-02: qty 50

## 2012-05-03 ENCOUNTER — Encounter (HOSPITAL_COMMUNITY): Payer: Self-pay | Admitting: Vascular Surgery

## 2012-05-03 ENCOUNTER — Inpatient Hospital Stay (HOSPITAL_COMMUNITY)
Admission: RE | Admit: 2012-05-03 | Discharge: 2012-05-08 | DRG: 460 | Disposition: A | Discharge status: Home Health | Payer: 59 | Source: Ambulatory Visit | Attending: Neurological Surgery | Admitting: Neurological Surgery

## 2012-05-03 ENCOUNTER — Encounter (HOSPITAL_COMMUNITY): Payer: Self-pay | Admitting: *Deleted

## 2012-05-03 ENCOUNTER — Inpatient Hospital Stay (HOSPITAL_COMMUNITY): Payer: 59

## 2012-05-03 ENCOUNTER — Ambulatory Visit (HOSPITAL_COMMUNITY): Payer: 59

## 2012-05-03 ENCOUNTER — Encounter (HOSPITAL_COMMUNITY): Payer: Self-pay | Admitting: General Practice

## 2012-05-03 ENCOUNTER — Ambulatory Visit (HOSPITAL_COMMUNITY): Payer: 59 | Admitting: Vascular Surgery

## 2012-05-03 ENCOUNTER — Encounter (HOSPITAL_COMMUNITY)
Admission: RE | Disposition: A | Discharge status: Home Health | Payer: Self-pay | Source: Ambulatory Visit | Attending: Neurological Surgery

## 2012-05-03 DIAGNOSIS — F319 Bipolar disorder, unspecified: Secondary | ICD-10-CM | POA: Diagnosis present

## 2012-05-03 DIAGNOSIS — Z85828 Personal history of other malignant neoplasm of skin: Secondary | ICD-10-CM

## 2012-05-03 DIAGNOSIS — Z90710 Acquired absence of both cervix and uterus: Secondary | ICD-10-CM

## 2012-05-03 DIAGNOSIS — M51379 Other intervertebral disc degeneration, lumbosacral region without mention of lumbar back pain or lower extremity pain: Principal | ICD-10-CM | POA: Diagnosis present

## 2012-05-03 DIAGNOSIS — Y831 Surgical operation with implant of artificial internal device as the cause of abnormal reaction of the patient, or of later complication, without mention of misadventure at the time of the procedure: Secondary | ICD-10-CM | POA: Diagnosis not present

## 2012-05-03 DIAGNOSIS — Z8701 Personal history of pneumonia (recurrent): Secondary | ICD-10-CM

## 2012-05-03 DIAGNOSIS — G988 Other disorders of nervous system: Secondary | ICD-10-CM | POA: Diagnosis not present

## 2012-05-03 DIAGNOSIS — M5137 Other intervertebral disc degeneration, lumbosacral region: Principal | ICD-10-CM | POA: Diagnosis present

## 2012-05-03 DIAGNOSIS — K219 Gastro-esophageal reflux disease without esophagitis: Secondary | ICD-10-CM | POA: Diagnosis present

## 2012-05-03 DIAGNOSIS — S7410XA Injury of femoral nerve at hip and thigh level, unspecified leg, initial encounter: Secondary | ICD-10-CM | POA: Diagnosis not present

## 2012-05-03 DIAGNOSIS — J42 Unspecified chronic bronchitis: Secondary | ICD-10-CM

## 2012-05-03 DIAGNOSIS — Z981 Arthrodesis status: Secondary | ICD-10-CM

## 2012-05-03 DIAGNOSIS — Z9089 Acquired absence of other organs: Secondary | ICD-10-CM

## 2012-05-03 DIAGNOSIS — I10 Essential (primary) hypertension: Secondary | ICD-10-CM | POA: Diagnosis present

## 2012-05-03 DIAGNOSIS — Z888 Allergy status to other drugs, medicaments and biological substances status: Secondary | ICD-10-CM

## 2012-05-03 DIAGNOSIS — F17200 Nicotine dependence, unspecified, uncomplicated: Secondary | ICD-10-CM | POA: Diagnosis present

## 2012-05-03 DIAGNOSIS — J449 Chronic obstructive pulmonary disease, unspecified: Secondary | ICD-10-CM | POA: Diagnosis present

## 2012-05-03 DIAGNOSIS — J4489 Other specified chronic obstructive pulmonary disease: Secondary | ICD-10-CM | POA: Diagnosis present

## 2012-05-03 DIAGNOSIS — Z8719 Personal history of other diseases of the digestive system: Secondary | ICD-10-CM

## 2012-05-03 DIAGNOSIS — Z8711 Personal history of peptic ulcer disease: Secondary | ICD-10-CM

## 2012-05-03 HISTORY — DX: Emphysema, unspecified: J43.9

## 2012-05-03 HISTORY — DX: Dyspnea, unspecified: R06.00

## 2012-05-03 HISTORY — DX: Bipolar disorder, unspecified: F31.9

## 2012-05-03 HISTORY — DX: Personal history of other diseases of the digestive system: Z87.19

## 2012-05-03 HISTORY — PX: ANTERIOR LAT LUMBAR FUSION: SHX1168

## 2012-05-03 HISTORY — DX: Other forms of dyspnea: R06.09

## 2012-05-03 HISTORY — DX: Other chronic pain: G89.29

## 2012-05-03 HISTORY — DX: Low back pain, unspecified: M54.50

## 2012-05-03 HISTORY — PX: LATERAL / POSTERIOR COMBINED FUSION LUMBAR SPINE: SUR300

## 2012-05-03 HISTORY — DX: Unspecified chronic bronchitis: J42

## 2012-05-03 HISTORY — DX: Low back pain: M54.5

## 2012-05-03 HISTORY — DX: Unspecified malignant neoplasm of skin of unspecified part of face: C44.300

## 2012-05-03 LAB — GLUCOSE, CAPILLARY: Glucose-Capillary: 95 mg/dL (ref 70–99)

## 2012-05-03 SURGERY — ANTERIOR LATERAL LUMBAR FUSION 3 LEVELS
Anesthesia: General | Site: Spine Lumbar | Wound class: Clean

## 2012-05-03 MED ORDER — DEXAMETHASONE SODIUM PHOSPHATE 10 MG/ML IJ SOLN
INTRAMUSCULAR | Status: AC
  Administered 2012-05-03: 10 mg via INTRAVENOUS
  Filled 2012-05-03: qty 1

## 2012-05-03 MED ORDER — ACETAMINOPHEN 10 MG/ML IV SOLN
INTRAVENOUS | Status: DC | PRN
  Administered 2012-05-03: 1000 mg via INTRAVENOUS

## 2012-05-03 MED ORDER — POTASSIUM CHLORIDE IN NACL 20-0.9 MEQ/L-% IV SOLN
INTRAVENOUS | Status: DC
  Administered 2012-05-03 – 2012-05-04 (×2): via INTRAVENOUS
  Filled 2012-05-03 (×11): qty 1000

## 2012-05-03 MED ORDER — ALBUTEROL SULFATE HFA 108 (90 BASE) MCG/ACT IN AERS
2.0000 | INHALATION_SPRAY | Freq: Four times a day (QID) | RESPIRATORY_TRACT | Status: DC | PRN
  Filled 2012-05-03: qty 6.7

## 2012-05-03 MED ORDER — HYDROMORPHONE HCL PF 1 MG/ML IJ SOLN
INTRAMUSCULAR | Status: AC
  Administered 2012-05-03: 0.5 mg
  Filled 2012-05-03: qty 1

## 2012-05-03 MED ORDER — DEXAMETHASONE SODIUM PHOSPHATE 10 MG/ML IJ SOLN: 10.0000 mg | INTRAMUSCULAR | Status: DC

## 2012-05-03 MED ORDER — DEXAMETHASONE 4 MG PO TABS
4.0000 mg | ORAL_TABLET | Freq: Four times a day (QID) | ORAL | Status: DC
  Administered 2012-05-04 – 2012-05-08 (×16): 4 mg via ORAL
  Filled 2012-05-03 (×24): qty 1

## 2012-05-03 MED ORDER — BUPIVACAINE HCL (PF) 0.25 % IJ SOLN
INTRAMUSCULAR | Status: DC | PRN
  Administered 2012-05-03: 5 mL

## 2012-05-03 MED ORDER — CEFAZOLIN SODIUM 1-5 GM-% IV SOLN
INTRAVENOUS | Status: AC
  Filled 2012-05-03: qty 50

## 2012-05-03 MED ORDER — FENTANYL CITRATE 0.05 MG/ML IJ SOLN: 25.0000 ug | INTRAMUSCULAR | Status: DC | PRN

## 2012-05-03 MED ORDER — SUCCINYLCHOLINE CHLORIDE 20 MG/ML IJ SOLN
INTRAMUSCULAR | Status: DC | PRN
  Administered 2012-05-03: 100 mg via INTRAVENOUS

## 2012-05-03 MED ORDER — OXYCODONE HCL 5 MG PO TABS: 10.0000 mg | ORAL_TABLET | ORAL | Status: DC | PRN

## 2012-05-03 MED ORDER — CYCLOBENZAPRINE HCL 10 MG PO TABS
10.0000 mg | ORAL_TABLET | Freq: Three times a day (TID) | ORAL | Status: DC | PRN
  Administered 2012-05-04 – 2012-05-07 (×4): 10 mg via ORAL
  Filled 2012-05-03 (×4): qty 1

## 2012-05-03 MED ORDER — IRBESARTAN 75 MG PO TABS
75.0000 mg | ORAL_TABLET | Freq: Every day | ORAL | Status: DC
  Administered 2012-05-03 – 2012-05-08 (×5): 75 mg via ORAL
  Filled 2012-05-03 (×7): qty 1

## 2012-05-03 MED ORDER — ALBUTEROL SULFATE (5 MG/ML) 0.5% IN NEBU: 2.5000 mg | INHALATION_SOLUTION | RESPIRATORY_TRACT | Status: DC | PRN

## 2012-05-03 MED ORDER — DIPHENHYDRAMINE HCL 50 MG/ML IJ SOLN: 12.5000 mg | Freq: Four times a day (QID) | INTRAMUSCULAR | Status: DC | PRN

## 2012-05-03 MED ORDER — CEFAZOLIN SODIUM 1-5 GM-% IV SOLN
INTRAVENOUS | Status: DC | PRN
  Administered 2012-05-03: 2 g via INTRAVENOUS
  Administered 2012-05-03: 1 g via INTRAVENOUS

## 2012-05-03 MED ORDER — LABETALOL HCL 5 MG/ML IV SOLN
INTRAVENOUS | Status: DC | PRN
  Administered 2012-05-03 (×2): 2.5 mg via INTRAVENOUS

## 2012-05-03 MED ORDER — SODIUM CHLORIDE 0.9 % IJ SOLN: 3.0000 mL | INTRAMUSCULAR | Status: DC | PRN

## 2012-05-03 MED ORDER — CEFAZOLIN SODIUM 1-5 GM-% IV SOLN
1.0000 g | Freq: Three times a day (TID) | INTRAVENOUS | Status: AC
  Administered 2012-05-03 – 2012-05-04 (×2): 1 g via INTRAVENOUS
  Filled 2012-05-03 (×2): qty 50

## 2012-05-03 MED ORDER — ONDANSETRON HCL 4 MG/2ML IJ SOLN: 4.0000 mg | Freq: Four times a day (QID) | INTRAMUSCULAR | Status: DC | PRN

## 2012-05-03 MED ORDER — FENTANYL CITRATE 0.05 MG/ML IJ SOLN
INTRAMUSCULAR | Status: DC | PRN
  Administered 2012-05-03: 100 ug via INTRAVENOUS
  Administered 2012-05-03: 50 ug via INTRAVENOUS
  Administered 2012-05-03: 100 ug via INTRAVENOUS
  Administered 2012-05-03 (×3): 50 ug via INTRAVENOUS
  Administered 2012-05-03: 100 ug via INTRAVENOUS
  Administered 2012-05-03: 50 ug via INTRAVENOUS
  Administered 2012-05-03 (×3): 100 ug via INTRAVENOUS
  Administered 2012-05-03 (×3): 50 ug via INTRAVENOUS

## 2012-05-03 MED ORDER — SODIUM CHLORIDE 0.9 % IJ SOLN
3.0000 mL | Freq: Two times a day (BID) | INTRAMUSCULAR | Status: DC
  Administered 2012-05-03 – 2012-05-07 (×4): 3 mL via INTRAVENOUS

## 2012-05-03 MED ORDER — HYDROMORPHONE 0.3 MG/ML IV SOLN
INTRAVENOUS | Status: AC
  Administered 2012-05-03: 16:00:00
  Filled 2012-05-03: qty 25

## 2012-05-03 MED ORDER — 0.9 % SODIUM CHLORIDE (POUR BTL) OPTIME
TOPICAL | Status: DC | PRN
  Administered 2012-05-03: 1000 mL

## 2012-05-03 MED ORDER — SODIUM CHLORIDE 0.9 % IV SOLN
INTRAVENOUS | Status: AC
  Filled 2012-05-03: qty 500

## 2012-05-03 MED ORDER — PANTOPRAZOLE SODIUM 40 MG PO TBEC
40.0000 mg | DELAYED_RELEASE_TABLET | Freq: Every day | ORAL | Status: DC
  Administered 2012-05-04 – 2012-05-07 (×4): 40 mg via ORAL
  Filled 2012-05-03 (×4): qty 1

## 2012-05-03 MED ORDER — ACETAMINOPHEN 650 MG RE SUPP: 650.0000 mg | RECTAL | Status: DC | PRN

## 2012-05-03 MED ORDER — HYDROMORPHONE HCL PF 1 MG/ML IJ SOLN
INTRAMUSCULAR | Status: AC
  Filled 2012-05-03: qty 1

## 2012-05-03 MED ORDER — PHENYLEPHRINE HCL 10 MG/ML IJ SOLN
INTRAMUSCULAR | Status: DC | PRN
  Administered 2012-05-03 (×7): 40 ug via INTRAVENOUS

## 2012-05-03 MED ORDER — LACTATED RINGERS IV SOLN
INTRAVENOUS | Status: DC | PRN
  Administered 2012-05-03 (×4): via INTRAVENOUS

## 2012-05-03 MED ORDER — ONDANSETRON HCL 4 MG/2ML IJ SOLN
INTRAMUSCULAR | Status: DC | PRN
  Administered 2012-05-03: 4 mg via INTRAVENOUS

## 2012-05-03 MED ORDER — NALOXONE HCL 0.4 MG/ML IJ SOLN: 0.4000 mg | INTRAMUSCULAR | Status: DC | PRN

## 2012-05-03 MED ORDER — ACETAMINOPHEN 325 MG PO TABS: 650.0000 mg | ORAL_TABLET | ORAL | Status: DC | PRN

## 2012-05-03 MED ORDER — TIOTROPIUM BROMIDE MONOHYDRATE 18 MCG IN CAPS
18.0000 ug | ORAL_CAPSULE | Freq: Every day | RESPIRATORY_TRACT | Status: DC
  Filled 2012-05-03: qty 5

## 2012-05-03 MED ORDER — BISACODYL 10 MG RE SUPP: 10.0000 mg | Freq: Every day | RECTAL | Status: DC | PRN

## 2012-05-03 MED ORDER — SODIUM CHLORIDE 0.9 % IJ SOLN: 9.0000 mL | INTRAMUSCULAR | Status: DC | PRN

## 2012-05-03 MED ORDER — LITHIUM CARBONATE 300 MG PO CAPS: 600.0000 mg | ORAL_CAPSULE | Freq: Two times a day (BID) | ORAL | Status: DC

## 2012-05-03 MED ORDER — PHENOL 1.4 % MT LIQD: 1.0000 | OROMUCOSAL | Status: DC | PRN

## 2012-05-03 MED ORDER — MAGNESIUM CITRATE PO SOLN
1.0000 | Freq: Once | ORAL | Status: AC | PRN
  Filled 2012-05-03: qty 296

## 2012-05-03 MED ORDER — SODIUM CHLORIDE 0.9 % IR SOLN
Status: DC | PRN
  Administered 2012-05-03 (×2)

## 2012-05-03 MED ORDER — ACETAMINOPHEN 10 MG/ML IV SOLN
INTRAVENOUS | Status: AC
  Filled 2012-05-03: qty 100

## 2012-05-03 MED ORDER — CLONAZEPAM 1 MG PO TABS
2.0000 mg | ORAL_TABLET | Freq: Two times a day (BID) | ORAL | Status: DC | PRN
  Administered 2012-05-07: 2 mg via ORAL
  Filled 2012-05-03: qty 2

## 2012-05-03 MED ORDER — ACETAMINOPHEN 10 MG/ML IV SOLN
1000.0000 mg | Freq: Four times a day (QID) | INTRAVENOUS | Status: AC
  Administered 2012-05-03 – 2012-05-04 (×4): 1000 mg via INTRAVENOUS
  Filled 2012-05-03 (×4): qty 100

## 2012-05-03 MED ORDER — ONDANSETRON HCL 4 MG/2ML IJ SOLN: 4.0000 mg | INTRAMUSCULAR | Status: DC | PRN

## 2012-05-03 MED ORDER — DEXAMETHASONE SODIUM PHOSPHATE 4 MG/ML IJ SOLN
4.0000 mg | Freq: Four times a day (QID) | INTRAMUSCULAR | Status: DC
  Administered 2012-05-03 – 2012-05-04 (×3): 4 mg via INTRAVENOUS
  Filled 2012-05-03 (×23): qty 1

## 2012-05-03 MED ORDER — BACITRACIN 50000 UNITS IM SOLR
INTRAMUSCULAR | Status: AC
  Filled 2012-05-03: qty 1

## 2012-05-03 MED ORDER — SODIUM CHLORIDE 0.9 % IV SOLN: 250.0000 mL | INTRAVENOUS | Status: DC

## 2012-05-03 MED ORDER — DIPHENHYDRAMINE HCL 12.5 MG/5ML PO ELIX: 12.5000 mg | ORAL_SOLUTION | Freq: Four times a day (QID) | ORAL | Status: DC | PRN

## 2012-05-03 MED ORDER — MORPHINE SULFATE 10 MG/ML IJ SOLN
INTRAMUSCULAR | Status: DC | PRN
  Administered 2012-05-03: 4 mg via INTRAVENOUS
  Administered 2012-05-03: 2 mg via INTRAVENOUS
  Administered 2012-05-03 (×2): 4 mg via INTRAVENOUS

## 2012-05-03 MED ORDER — SODIUM CHLORIDE 0.9 % IV SOLN
10.0000 mg | INTRAVENOUS | Status: DC | PRN
  Administered 2012-05-03: 10 ug/min via INTRAVENOUS

## 2012-05-03 MED ORDER — EPHEDRINE SULFATE 50 MG/ML IJ SOLN
INTRAMUSCULAR | Status: DC | PRN
  Administered 2012-05-03 (×3): 5 mg via INTRAVENOUS

## 2012-05-03 MED ORDER — TIOTROPIUM BROMIDE MONOHYDRATE 18 MCG IN CAPS
18.0000 ug | ORAL_CAPSULE | Freq: Every day | RESPIRATORY_TRACT | Status: DC
  Administered 2012-05-03 – 2012-05-07 (×4): 18 ug via RESPIRATORY_TRACT

## 2012-05-03 MED ORDER — PROPOFOL 10 MG/ML IV BOLUS
INTRAVENOUS | Status: DC | PRN
  Administered 2012-05-03: 180 mg via INTRAVENOUS
  Administered 2012-05-03: 30 mg via INTRAVENOUS
  Administered 2012-05-03 (×2): 20 mg via INTRAVENOUS

## 2012-05-03 MED ORDER — MIDAZOLAM HCL 5 MG/5ML IJ SOLN
INTRAMUSCULAR | Status: DC | PRN
  Administered 2012-05-03: 2 mg via INTRAVENOUS

## 2012-05-03 MED ORDER — HYDROMORPHONE 0.3 MG/ML IV SOLN
INTRAVENOUS | Status: DC
  Administered 2012-05-03: 16:00:00 via INTRAVENOUS
  Administered 2012-05-03: 3.39 mg via INTRAVENOUS
  Administered 2012-05-04: 2.19 mg via INTRAVENOUS
  Administered 2012-05-04 (×2): 3.39 mg via INTRAVENOUS
  Administered 2012-05-04: 1.59 mg via INTRAVENOUS
  Administered 2012-05-04: 07:00:00 via INTRAVENOUS
  Administered 2012-05-04: 1.19 mg via INTRAVENOUS
  Administered 2012-05-05: 23:00:00 via INTRAVENOUS
  Administered 2012-05-05: 3.19 mg via INTRAVENOUS
  Administered 2012-05-05: 2.59 mg via INTRAVENOUS
  Administered 2012-05-05: 25 mL via INTRAVENOUS
  Administered 2012-05-05: 3.15 mg via INTRAVENOUS
  Administered 2012-05-05: 3.18 mg via INTRAVENOUS
  Administered 2012-05-05: 1.59 mg via INTRAVENOUS
  Administered 2012-05-05: 3.19 mg via INTRAVENOUS
  Administered 2012-05-06: 1.99 mg via INTRAVENOUS
  Administered 2012-05-06: 12:00:00 via INTRAVENOUS
  Administered 2012-05-06: 2.31 mg via INTRAVENOUS
  Administered 2012-05-06: 4.94 mg via INTRAVENOUS
  Administered 2012-05-06: 2.59 mg via INTRAVENOUS
  Administered 2012-05-06: 2.39 mg via INTRAVENOUS
  Administered 2012-05-06: 2.99 mg via INTRAVENOUS
  Administered 2012-05-07: 0.99 mg via INTRAVENOUS
  Administered 2012-05-07: 1.15 mg via INTRAVENOUS
  Administered 2012-05-07: 01:00:00 via INTRAVENOUS
  Administered 2012-05-07: 1.9 mg via INTRAVENOUS
  Filled 2012-05-03 (×7): qty 25

## 2012-05-03 MED ORDER — SENNA 8.6 MG PO TABS
1.0000 | ORAL_TABLET | Freq: Two times a day (BID) | ORAL | Status: DC
  Administered 2012-05-03 – 2012-05-08 (×10): 8.6 mg via ORAL
  Filled 2012-05-03 (×11): qty 1

## 2012-05-03 MED ORDER — LITHIUM CARBONATE 300 MG PO CAPS
600.0000 mg | ORAL_CAPSULE | Freq: Every day | ORAL | Status: DC
  Administered 2012-05-04 – 2012-05-08 (×5): 600 mg via ORAL
  Filled 2012-05-03 (×7): qty 2

## 2012-05-03 MED ORDER — CEFAZOLIN SODIUM 1-5 GM-% IV SOLN: 1.0000 g | INTRAVENOUS | Status: DC

## 2012-05-03 MED ORDER — OXYCODONE-ACETAMINOPHEN 5-325 MG PO TABS
1.0000 | ORAL_TABLET | ORAL | Status: DC | PRN
  Administered 2012-05-05 – 2012-05-08 (×12): 2 via ORAL
  Filled 2012-05-03 (×14): qty 2

## 2012-05-03 MED ORDER — LITHIUM CARBONATE 300 MG PO CAPS
900.0000 mg | ORAL_CAPSULE | Freq: Every evening | ORAL | Status: DC
  Administered 2012-05-03 – 2012-05-07 (×5): 900 mg via ORAL
  Filled 2012-05-03 (×6): qty 3

## 2012-05-03 MED ORDER — MENTHOL 3 MG MT LOZG: 1.0000 | LOZENGE | OROMUCOSAL | Status: DC | PRN

## 2012-05-03 SURGICAL SUPPLY — 78 items
110 mm prebent rod ×3 IMPLANT
ADH SKN CLS LQ APL DERMABOND (GAUZE/BANDAGES/DRESSINGS) ×3
BAG DECANTER FOR FLEXI CONT (MISCELLANEOUS) ×3 IMPLANT
BENZOIN TINCTURE PRP APPL 2/3 (GAUZE/BANDAGES/DRESSINGS) IMPLANT
BLADE SURG ROTATE 9660 (MISCELLANEOUS) IMPLANT
BONE MATRIX OSTEOCEL PLUS 10CC (Bone Implant) ×3 IMPLANT
BRUSH SCRUB EZ PLAIN DRY (MISCELLANEOUS) IMPLANT
CAGE COROENT 12X18X50 (Cage) ×3 IMPLANT
CLOTH BEACON ORANGE TIMEOUT ST (SAFETY) ×3 IMPLANT
CONT SPEC 4OZ CLIKSEAL STRL BL (MISCELLANEOUS) IMPLANT
COVER BACK TABLE 24X17X13 BIG (DRAPES) IMPLANT
COVER TABLE BACK 60X90 (DRAPES) IMPLANT
DERMABOND ADHESIVE PROPEN (GAUZE/BANDAGES/DRESSINGS) ×3
DERMABOND ADVANCED (GAUZE/BANDAGES/DRESSINGS)
DERMABOND ADVANCED .7 DNX12 (GAUZE/BANDAGES/DRESSINGS) IMPLANT
DERMABOND ADVANCED .7 DNX6 (GAUZE/BANDAGES/DRESSINGS) ×6 IMPLANT
DRAPE C-ARM 42X72 X-RAY (DRAPES) ×3 IMPLANT
DRAPE C-ARMOR (DRAPES) ×3 IMPLANT
DRAPE LAPAROTOMY 100X72X124 (DRAPES) ×3 IMPLANT
DRAPE POUCH INSTRU U-SHP 10X18 (DRAPES) ×3 IMPLANT
DRAPE SURG 17X23 STRL (DRAPES) ×12 IMPLANT
DRESSING TELFA 8X3 (GAUZE/BANDAGES/DRESSINGS) IMPLANT
DRSG OPSITE 4X5.5 SM (GAUZE/BANDAGES/DRESSINGS) IMPLANT
DURAPREP 26ML APPLICATOR (WOUND CARE) ×3 IMPLANT
ELECT BLADE 4.0 EZ CLEAN MEGAD (MISCELLANEOUS)
ELECT REM PT RETURN 9FT ADLT (ELECTROSURGICAL) ×3
ELECTRODE BLDE 4.0 EZ CLN MEGD (MISCELLANEOUS) IMPLANT
ELECTRODE REM PT RTRN 9FT ADLT (ELECTROSURGICAL) ×2 IMPLANT
EVACUATOR 1/8 PVC DRAIN (DRAIN) IMPLANT
GAUZE SPONGE 4X4 16PLY XRAY LF (GAUZE/BANDAGES/DRESSINGS) ×3 IMPLANT
GLOVE BIO SURGEON STRL SZ8 (GLOVE) ×3 IMPLANT
GLOVE BIOGEL PI IND STRL 7.0 (GLOVE) ×6 IMPLANT
GLOVE BIOGEL PI IND STRL 7.5 (GLOVE) ×2 IMPLANT
GLOVE BIOGEL PI INDICATOR 7.0 (GLOVE) ×3
GLOVE BIOGEL PI INDICATOR 7.5 (GLOVE) ×1
GLOVE ECLIPSE 7.5 STRL STRAW (GLOVE) ×6 IMPLANT
GLOVE SS BIOGEL STRL SZ 6.5 (GLOVE) ×4 IMPLANT
GLOVE SUPERSENSE BIOGEL SZ 6.5 (GLOVE) ×2
GOWN BRE IMP SLV AUR LG STRL (GOWN DISPOSABLE) ×3 IMPLANT
GOWN BRE IMP SLV AUR XL STRL (GOWN DISPOSABLE) ×12 IMPLANT
GOWN STRL REIN 2XL LVL4 (GOWN DISPOSABLE) IMPLANT
HEMOSTAT POWDER KIT SURGIFOAM (HEMOSTASIS) IMPLANT
IMPLANT COROENT XL 10X18X45 (Intraocular Lens) ×3 IMPLANT
IMPLANT COROENT XL 10X18X50 (Orthopedic Implant) ×3 IMPLANT
KIT BASIN OR (CUSTOM PROCEDURE TRAY) ×3 IMPLANT
KIT DILATOR XLIF 5 (KITS) ×2 IMPLANT
KIT MAXCESS (KITS) ×3 IMPLANT
KIT NEEDLE NVM5 EMG ELECT (KITS) ×2 IMPLANT
KIT NEEDLE NVM5 EMG ELECTRODE (KITS) ×1
KIT ROOM TURNOVER OR (KITS) ×3 IMPLANT
KIT XLIF (KITS) ×1
MIX DBX 10CC 35% BONE (Bone Implant) ×3 IMPLANT
NEEDLE HYPO 22GX1.5 SAFETY (NEEDLE) IMPLANT
NEEDLE HYPO 25X1 1.5 SAFETY (NEEDLE) ×3 IMPLANT
NS IRRIG 1000ML POUR BTL (IV SOLUTION) ×3 IMPLANT
PACK LAMINECTOMY NEURO (CUSTOM PROCEDURE TRAY) ×3 IMPLANT
PUTTY BONE DBX 5CC MIX (Putty) ×3 IMPLANT
SCREW PRECEPT 5.5X45 (Screw) ×3 IMPLANT
SCREW PRECEPT 6.5X45 (Screw) ×9 IMPLANT
SCREW PRECEPT SET (Screw) ×12 IMPLANT
SPONGE GAUZE 4X4 12PLY (GAUZE/BANDAGES/DRESSINGS) IMPLANT
SPONGE LAP 4X18 X RAY DECT (DISPOSABLE) IMPLANT
SPONGE SURGIFOAM ABS GEL SZ50 (HEMOSTASIS) IMPLANT
STAPLER SKIN PROX WIDE 3.9 (STAPLE) IMPLANT
STRIP CLOSURE SKIN 1/2X4 (GAUZE/BANDAGES/DRESSINGS) IMPLANT
SUT VIC AB 0 CT1 18XCR BRD8 (SUTURE) ×4 IMPLANT
SUT VIC AB 0 CT1 8-18 (SUTURE) ×2
SUT VIC AB 2-0 CP2 18 (SUTURE) ×6 IMPLANT
SUT VIC AB 2-0 OS6 18 (SUTURE) IMPLANT
SUT VIC AB 3-0 CP2 18 (SUTURE) IMPLANT
SUT VIC AB 3-0 SH 8-18 (SUTURE) ×9 IMPLANT
SYR 20ML ECCENTRIC (SYRINGE) ×3 IMPLANT
TAPE CLOTH 3X10 TAN LF (GAUZE/BANDAGES/DRESSINGS) ×6 IMPLANT
TOWEL OR 17X24 6PK STRL BLUE (TOWEL DISPOSABLE) ×3 IMPLANT
TOWEL OR 17X26 10 PK STRL BLUE (TOWEL DISPOSABLE) ×3 IMPLANT
TRAP SPECIMEN MUCOUS 40CC (MISCELLANEOUS) IMPLANT
TRAY FOLEY CATH 14FRSI W/METER (CATHETERS) ×3 IMPLANT
WATER STERILE IRR 1000ML POUR (IV SOLUTION) ×3 IMPLANT

## 2012-05-03 NOTE — Transfer of Care (Signed)
Immediate Anesthesia Transfer of Care Note  Patient: Brenda Frank  Procedure(s) Performed: Procedure(s) (LRB) with comments: ANTERIOR LATERAL LUMBAR FUSION 3 LEVELS (Left) - Left lumbar two-three, lumbar three-four, lumbar four-five anteriolateral retroperitoneal interbody fusion with pedicle screws LUMBAR PERCUTANEOUS PEDICLE SCREW 3 LEVEL (N/A)  Patient Location: PACU  Anesthesia Type: General  Level of Consciousness: awake, oriented, patient cooperative and responds to stimulation  Airway & Oxygen Therapy: Patient Spontanous Breathing and Patient connected to face mask oxygen  Post-op Assessment: Report given to PACU RN, Post -op Vital signs reviewed and stable and Patient moving all extremities X 4  Post vital signs: Reviewed and stable  Complications: No apparent anesthesia complications

## 2012-05-03 NOTE — Progress Notes (Signed)
Patient ID: Brenda Frank, female   DOB: 09-Nov-1970, 41 y.o.   MRN: [id number] Postoperatively, she appears to be somewhat weak in her hip flexor and quadriceps on the left. She is still fairly sleepy at this point so it is difficult to know for sure. We never got any free run or bad readings intraoperatively on our EMG, though I did see the femoral nerve within the psoas musculature at both the L4-5 and L3-4 levels and it certainly appeared to be in continuity. A postoperative femoral neuropathy is one of the more common complications of this surgery because of its approach and has been well described and usually occurs at L4-5, but usually recovers within 3-9 months postoperatively. I certainly hope this is not the case, and I will continue to monitor her progress.

## 2012-05-03 NOTE — Preoperative (Signed)
Beta Blockers   Reason not to administer Beta Blockers:Not Applicable 

## 2012-05-03 NOTE — Op Note (Signed)
05/03/2012  2:16 PM  PATIENT:  Brenda Frank  41 y.o. female  PRE-OPERATIVE DIAGNOSIS:  Lumbar spondylosis with degenerative disc disease L2-3 L3-4 L4-5 with discogenic back pain  POST-OPERATIVE DIAGNOSIS:  Same  PROCEDURE:  1. Anterolateral retroperitoneal interbody fusion L2-3 L3-4 L4-5 utilizing peek interbody cages packed with morcellized allograft, 2. Segmental fixation L2-5 utilizing percutaneous pedicle screws  SURGEON:  Marikay Alar, MD  ASSISTANTS: Lovell Sheehan  ANESTHESIA:   General  EBL: 300 ml  Total I/O In: 3000 [I.V.:3000] Out: 935 [Urine:585; Blood:350]  BLOOD ADMINISTERED:none  DRAINS: None   SPECIMEN:  No Specimen  INDICATION FOR PROCEDURE: This patient presented with a long history of severe uncontrolled back pain. She tried medical management quite some time without relief. She had an MRI which showed some degenerative disc disease L2-L3 4 L4-5. Discogram confirmed concordant pain at L3-4 L4-5 with also severe pain at L2-3 with severe degeneration and disruption of the disc at all 3 levels. Recommended a three-level instrumented fusion in hopes of improving her pain syndrome. Patient understood the risks, benefits, and alternatives and potential outcomes and wished to proceed.  PROCEDURE DETAILS: The patient was brought to the operating room and after induction of adequate generalized endotracheal anesthesia, the patient was positioned in the right lateral decubitus position exposing the left side. The patient was positioned in the typical XLIF fashion and taped into position and trajectories were confirmed with AP lateral fluoroscopy. The skin was cleaned and then prepped with DuraPrep and draped in the usual sterile fashion. 5 cc of local anesthesia was injected. A lateral incision was made over the appropriate disc space (the exact same procedure was used at all 3 levels for the discectomy and fusion) and a incision was made posterior lateral to this. Blunt finger  dissection was used to enter the retroperitoneal space. I could palpate the psoas musculature as well as the anterior face of the transverse process, and I could feel the fatty tissues of the retroperitoneum. I swept my finger up to the lateral incision and passed my first dilator down to psoas musculature utilizing my index finger. We then used EMG monitoring to monitor our dilator. This was done at all 3 levels. At L4-5 the monitoring pushed just into the anterior part of the disc space, but in the other 2 levels started about the 30-40 yard line. We checked our position with fluoroscopy and then placed a K wire into the disc space, and then used sequential dilators until our final retractor was in place. We started at L4-5 but again this exact same procedure was done at all 3 levels. At L4-5 again we were positioned anteriorly on the disc space and had walks the retractor posteriorly very slowly. We monitor the retractor the entire way. We then positioned it utilizing AP lateral fluoroscopy, and used our ball probe to make sure there were no neural structures within the working channel. I then placed the intradiscal shim, and opened my retractor further. The annulus was then incised and the discectomy was done with pituitaries. The annulus was removed from the endplates with a Cobb and the opposite annulus was opened and released. I then used a series of scrapers and shavers to prepare the endplates, taking care not to violate the endplates. I then placed my 16 mm paddle across the disc space to further open opposite annulus and I checked the trajectory with AP and lateral fluoroscopy. Again these exact same steps were done at each level. I then used sequential  trials to determine the correct size cage. The 12 mm trial fit the best at L4-5, and 10 mm trials fit the best L2-3 and L3-4, so  corresponding cages were selected and packed with morcellized allograft. Using sliders it was then tapped gently into the disc  space utilizing AP fluoroscopy until it was appropriately positioned at each level. I then irrigated with saline solution containing bacitracin and dried any bleeding points. I checked my final construct with AP lateral fluoroscopy, removed my retractor. I then turned my attention to the percutaneous pedicle screw placement. AP and lateral fluoroscopy was used to determine landmarks. Stab incisions were made over the pedicles and a Jamshidi needle was passed down to identify the transverse process and the pedicle screw entry zone. Utilizing AP fluoroscopy, we started the needle at the lateral border of the pedicle at each level and tapped it into the pedicle into it reached the medial border of the pedicle on the AP image. EMG monitoring was used. We then checked our lateral fluoroscopy to assure our needle was in good position, and then passed our K wire into the vertebral body. We did this for each pedicle in the same way. We were then able to tap over each K wire, measured the correct length of our screws, and then place our pedicle screws over the K wires until they were in correct position. We then passed our rod through the towers and locked into position with locking caps and antitorque device. We then checked our final construct with AP lateral fluoroscopy. Then irrigated with saline solution containing bacitracin and dried all bleeding points. The wounds were then closed in layers of 0 Vicryl in the fascia, 2-0 Vicryl in the subcutaneous tissues, and 3-0 Vicryl in the subcuticular tissues. The skin was closed with Dermabond and the patient was awakened from general anesthesia and transferred to the recovery in stable condition. At the end of the procedure all sponge, needle and instrument counts were correct.  PLAN OF CARE: Admit to inpatient   PATIENT DISPOSITION:  PACU - hemodynamically stable.   Delay start of Pharmacological VTE agent (>24hrs) due to surgical blood loss or risk of bleeding:   yes

## 2012-05-03 NOTE — Anesthesia Postprocedure Evaluation (Signed)
  Anesthesia Post-op Note  Patient: Brenda Frank  Procedure(s) Performed: Procedure(s) (LRB) with comments: ANTERIOR LATERAL LUMBAR FUSION 3 LEVELS (Left) - Left lumbar two-three, lumbar three-four, lumbar four-five anteriolateral retroperitoneal interbody fusion with pedicle screws LUMBAR PERCUTANEOUS PEDICLE SCREW 3 LEVEL (N/A)  Patient Location: PACU  Anesthesia Type: General  Level of Consciousness: awake  Airway and Oxygen Therapy: Patient Spontanous Breathing  Post-op Pain: mild  Post-op Assessment: Post-op Vital signs reviewed  Post-op Vital Signs: Reviewed  Complications: No apparent anesthesia complications

## 2012-05-03 NOTE — Anesthesia Preprocedure Evaluation (Addendum)
Anesthesia Evaluation  Patient identified by MRN, date of birth, ID band Patient awake    Reviewed: Allergy & Precautions, H&P , NPO status , Patient's Chart, lab work & pertinent test results  Airway Mallampati: II      Dental  (+) Poor Dentition and Dental Advisory Given   Pulmonary pneumonia -, resolved, COPD breath sounds clear to auscultation        Cardiovascular hypertension, Pt. on medications Rhythm:Regular Rate:Normal     Neuro/Psych    GI/Hepatic Neg liver ROS, PUD, GERD-  ,  Endo/Other  diabetes, Type 2  Renal/GU negative Renal ROS     Musculoskeletal   Abdominal   Peds  Hematology negative hematology ROS (+)   Anesthesia Other Findings   Reproductive/Obstetrics                         Anesthesia Physical Anesthesia Plan  ASA: III  Anesthesia Plan: General   Post-op Pain Management:    Induction: Intravenous  Airway Management Planned: Oral ETT  Additional Equipment: Arterial line  Intra-op Plan:   Post-operative Plan: Extubation in OR  Informed Consent: I have reviewed the patients History and Physical, chart, labs and discussed the procedure including the risks, benefits and alternatives for the proposed anesthesia with the patient or authorized representative who has indicated his/her understanding and acceptance.   Dental advisory given  Plan Discussed with: CRNA, Anesthesiologist and Surgeon  Anesthesia Plan Comments:        Anesthesia Quick Evaluation

## 2012-05-03 NOTE — Progress Notes (Signed)
Patient ID: Brenda Frank, female   DOB: June 05, 1971, 41 y.o.   MRN: [id number] Pt seen and examined. Her L quad is quite weak. She is naturally upset about it, but I have reassured her that studies suggest that post-op femoral neuropathies after XLIF tend to resolve over weeks to months. It is a known complication of the procedure secondary to the approach thru the psoas muscle, but it doesn't make it any easier for her to accept. That's understandable. I am unsure of the cause of it as we had no free run or abnormal EMG readings, and there were no "mishaps" or concerns during the surgery from a technical standpoint. Will check a CT to r/o psoas hematoma, but I don't suspect this to be the case. 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

## 2012-05-03 NOTE — H&P (Signed)
Subjective: Patient is a 41 y.o. female admitted for lumbar fusion. Onset of symptoms was many months ago, gradually worse since that time.  The pain is rated severe, and is located across the low back and radiates to legs. The pain is described as aching and occurs with activity. The symptoms have been progressive. Symptoms are exacerbated by activity. MRI or CT showed DDD and spondylosis. Diskogram positive with abnormal nucleogram at three levels.  Past Medical History  Diagnosis Date  . Hypertension   . COPD (chronic obstructive pulmonary disease)   . Diverticulosis   . Cancer     skin  . 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 Polycythemia 10/25/2011    Query secondary to smoking or hypoxia. Hemoglobin 16-17 g  . Cecal ulcer 10/26/2011    Per colonoscopy  . Diverticulosis 10/26/2011    Per colonoscopy  . Mental disorder     bipolar  . Pneumonia     hx  . Diabetes mellitus     no rx for 3-4 yrs  lost weight  . GERD (gastroesophageal reflux disease)   . Hernia, inguinal     rt  . Disorder of vocal cords     done  when intubated for hysterectomy  2008    Past Surgical History  Procedure Date  . Abdominal hysterectomy   . Cholecystectomy   . Hernia repair   . Abdominal surgery oophorectomy bil  . Appendectomy   . Colonoscopy 10/25/2011    Procedure: COLONOSCOPY;  Surgeon: Malissa Hippo, MD;  Location: [facility];  Service: Endoscopy;  Laterality: N/A;    Prior to Admission medications   Medication Sig Start Date End Date Taking? Authorizing Provider  albuterol (PROVENTIL HFA;VENTOLIN HFA) 108 (90 BASE) MCG/ACT inhaler Inhale 2 puffs into the lungs every 6 (six) hours as needed. For RESCUE.   Yes Historical Provider, MD  albuterol (PROVENTIL) (2.5 MG/3ML) 0.083% nebulizer solution Take 2.5 mg by nebulization every 6 (six) hours as needed. For shortness of breath   Yes Historical Provider, MD  clonazePAM (KLONOPIN) 2 MG tablet Take 2 mg by mouth 2 (two) times daily as  needed. For anxiety   Yes Historical Provider, MD  HYDROcodone-acetaminophen (NORCO) 10-325 MG per tablet Take 2 tablets by mouth every 8 (eight) hours as needed. For pain   Yes Historical Provider, MD  lithium carbonate 300 MG capsule Take 600-900 mg by mouth 2 (two) times daily. 2 capsules in the morning and 3 capsules at night   Yes Historical Provider, MD  pantoprazole (PROTONIX) 40 MG tablet Take 40 mg by mouth daily.   Yes Historical Provider, MD  telmisartan (MICARDIS) 80 MG tablet Take 80 mg by mouth daily.   Yes Historical Provider, MD  tiotropium (SPIRIVA) 18 MCG inhalation capsule Place 18 mcg into inhaler and inhale daily.   Yes Historical Provider, MD  dicyclomine (BENTYL) 10 MG capsule Take 10 mg by mouth 3 (three) times daily as needed. For IBS 02/28/12 02/27/13  Malissa Hippo, MD  hydrochlorothiazide (HYDRODIURIL) 25 MG tablet Take 25 mg by mouth as needed.    Historical Provider, MD   Allergies  Allergen Reactions  . Haldol (Haloperidol Decanoate)     Dystonic reaction, muscle cramping  . Lyrica (Pregabalin)   . Ultram (Tramadol Hcl) Other (See Comments)    Hallucinations    History  Substance Use Topics  . Smoking status: Current Everyday Smoker -- 2.0 packs/day for 20 years  Types: Cigarettes  . Smokeless tobacco: Never Used   Comment: Patient is currently using the Blue cigarettes to help stop smoking  . Alcohol Use: No    Family History  Problem Relation Age of Onset  . Colon cancer Neg Hx      Review of Systems  Positive ROS: neg  All other systems have been reviewed and were otherwise negative with the exception of those mentioned in the HPI and as above.  Objective: Vital signs in last 24 hours: Temp:  [98.1 F (36.7 C)] 98.1 F (36.7 C) (09/05 0622) Pulse Rate:  [83] 83  (09/05 0622) Resp:  [18] 18  (09/05 0622) BP: (95)/(67) 95/67 mmHg (09/05 0622) SpO2:  [92 %] 92 % (09/05 0622)  General Appearance: Alert, cooperative, no distress, appears  stated age Head: Normocephalic, without obvious abnormality, atraumatic Eyes: PERRL, conjunctiva/corneas clear      Neck: Supple, symmetrical, trachea midline Back: Symmetric, no curvature, ROM normal, no CVA tenderness Lungs: Clear to auscultation bilaterally, respirations unlabored Heart: Regular rate and rhythm, S1 and S2 normal Abdomen: Soft, non-tender Extremities: Extremities normal, atraumatic, no cyanosis or edema Pulses: 2+ and symmetric all extremities Skin: Skin color, texture, turgor normal, no rashes or lesions  NEUROLOGIC:   Mental status: Alert and oriented x4,  no aphasia, good attention span, fund of knowledge, and memory Motor Exam - grossly normal Sensory Exam - grossly normal Reflexes: 1+ Coordination - grossly normal Gait - grossly normal Balance - grossly normal Cranial Nerves: I: smell Not tested  II: visual acuity  OS: nl    OD: nl  II: visual fields Full to confrontation  II: pupils Equal, round, reactive to light  III,VII: ptosis None  III,IV,VI: extraocular muscles  Full ROM  V: mastication Normal  V: facial light touch sensation  Normal  V,VII: corneal reflex  Present  VII: facial muscle function - upper  Normal  VII: facial muscle function - lower Normal  VIII: hearing Not tested  IX: soft palate elevation  Normal  IX,X: gag reflex Present  XI: trapezius strength  5/5  XI: sternocleidomastoid strength 5/5  XI: neck flexion strength  5/5  XII: tongue strength  Normal    Data Review Lab Results  Component Value Date   WBC 9.9 04/27/2012   HGB 17.3* 04/27/2012   HCT 53.0* 04/27/2012   MCV 91.5 04/27/2012   PLT 253 04/27/2012   Lab Results  Component Value Date   NA 140 04/27/2012   K 4.4 04/27/2012   CL 101 04/27/2012   CO2 29 04/27/2012   BUN 13 04/27/2012   CREATININE 0.68 04/27/2012   GLUCOSE 90 04/27/2012   Lab Results  Component Value Date   INR 0.93 04/27/2012    Assessment/Plan: Patient admitted for XLIF L2-3, L3-4, L4-5. Patient  has failed conservative therapy.  I explained the condition and procedure to the patient and answered any questions.  Patient wishes to proceed with procedure as planned. Understands risks/ benefits and typical outcomes of procedure.   Zoe Goonan S 05/03/2012 7:07 AM

## 2012-05-04 LAB — POCT I-STAT 4, (NA,K, GLUC, HGB,HCT)
Glucose, Bld: 144 mg/dL — ABNORMAL HIGH (ref 70–99)
Potassium: 4.2 mEq/L (ref 3.5–5.1)
Sodium: 139 mEq/L (ref 135–145)

## 2012-05-04 MED ORDER — NICOTINE 21 MG/24HR TD PT24
21.0000 mg | MEDICATED_PATCH | Freq: Every day | TRANSDERMAL | Status: DC
  Administered 2012-05-04 – 2012-05-08 (×5): 21 mg via TRANSDERMAL
  Filled 2012-05-04 (×5): qty 1

## 2012-05-04 NOTE — Progress Notes (Signed)
Patient ID: Brenda Frank, female   DOB: 12-31-1970, 41 y.o.   MRN: [id number] Subjective: Patient reports numbness in ant thigh, but some improvement in ability to walk. L quad still quite weak, but was 0/5 last PM and now appears to fire some (1/5). Back appropriately quite sore.  Objective: Vital signs in last 24 hours: Temp:  [97.3 F (36.3 C)-99 F (37.2 C)] 98 F (36.7 C) (09/06 0536) Pulse Rate:  [99-101] 101  (09/06 0536) Resp:  [9-16] 14  (09/06 0711) BP: (100-134)/(53-77) 134/63 mmHg (09/06 0536) SpO2:  [90 %-96 %] 91 % (09/06 0711) Arterial Line BP: (150)/(75) 150/75 mmHg (09/05 1430) Weight:  [106.6 kg (235 lb 0.2 oz)] 106.6 kg (235 lb 0.2 oz) (09/06 0124)  Intake/Output from previous day: 09/05 0701 - 09/06 0700 In: 3363 [P.O.:360; I.V.:3003] Out: 2820 [Urine:2470; Blood:350] Intake/Output this shift:    Neurologic: Grossly normal except for weakness in L quad and hip flexor (hip flexor weakness expected) an numbness L ant thigh. Ambulates slowly with walker.  Lab Results: Lab Results  Component Value Date   WBC 9.9 04/27/2012   HGB 16.0* 05/03/2012   HCT 47.0* 05/03/2012   MCV 91.5 04/27/2012   PLT 253 04/27/2012   Lab Results  Component Value Date   INR 0.93 04/27/2012   BMET Lab Results  Component Value Date   NA 139 05/03/2012   K 4.2 05/03/2012   CL 101 04/27/2012   CO2 29 04/27/2012   GLUCOSE 144* 05/03/2012   BUN 13 04/27/2012   CREATININE 0.68 04/27/2012   CALCIUM 9.8 04/27/2012    Studies/Results: Dg Lumbar Spine 2-3 Views  05/03/2012  *RADIOLOGY REPORT*  Clinical Data: L2-3, L3-4, and L4-5 XLIF.  DG C-ARM GT 120 MIN,LUMBAR SPINE - 2-3 VIEW  Technique: Five intraoperative lumbar spine radiographs are submitted.  Comparison:  Lumbar discogram 03/23/2012.  Findings: The patient is status post fusion at L2-3, L3-4, and L4- 5.  The disc heights are restored.  Alignment is anatomic.  The spacers are in satisfactory position.  Right pedicle screw and rod fixation is in  place at these levels. The screws are well positioned.  IMPRESSION:  1.  Status post L2-3, L3-4, and L4-5 XLIF. 2.  No radiographic evidence for complication.   Original Report Authenticated By: Jamesetta Orleans. MATTERN, M.D.    Ct Lumbar Spine Wo Contrast  05/04/2012  *RADIOLOGY REPORT*  Clinical Data: Left leg weakness.  Lumbar fusion today.  CT LUMBAR SPINE WITHOUT CONTRAST  Technique:  Multidetector CT imaging of the lumbar spine was performed without intravenous contrast administration. Multiplanar CT image reconstructions were also generated.  Comparison: CT scan dated 03/23/2012 and radiographs dated 05/03/2012  Findings: T11-12 through L1-2 are normal.  L2-3:  Interbody fusion device and left pedicle screws are in excellent position.  Air is seen in the psoas muscles bilaterally with a small bone fragment in the left psoas muscle.  L3-4:  Pedicle screws and interbody fusion device are in excellent position.  No disc protrusion.  Air is seen in the psoas muscles bilaterally with slight swelling of the left psoas muscle.  L4-5:  The pedicle screws and interbody fusion device are in excellent position.  Fairly extensive air seen in the right psoas muscle and there are bone chips in the left psoas muscle with slight edema of the muscle.  There is a far lateral bulge of the disc lateral to the left neural foramen but the left L4 nerve is not impinged upon.  The L4-5 S1:  The disc is normal.  Minimal hypertrophy of the facet joints.  IMPRESSION: Postsurgical changes from the fusion as expected.  The interbody fusion devices and the pedicle screws and posterior rod appear in excellent position.  No impingement upon the spinal canal, neural foramina, or nerve roots.   Original Report Authenticated By: Gwynn Burly, M.D.    Dg C-arm Gt 120 Min  05/03/2012  *RADIOLOGY REPORT*  Clinical Data: L2-3, L3-4, and L4-5 XLIF.  DG C-ARM GT 120 MIN,LUMBAR SPINE - 2-3 VIEW  Technique: Five intraoperative lumbar spine  radiographs are submitted.  Comparison:  Lumbar discogram 03/23/2012.  Findings: The patient is status post fusion at L2-3, L3-4, and L4- 5.  The disc heights are restored.  Alignment is anatomic.  The spacers are in satisfactory position.  Right pedicle screw and rod fixation is in place at these levels. The screws are well positioned.  IMPRESSION:  1.  Status post L2-3, L3-4, and L4-5 XLIF. 2.  No radiographic evidence for complication.   Original Report Authenticated By: Jamesetta Orleans. MATTERN, M.D.     Assessment/Plan: Continue pain control. PT/OT. Does she need a knee brace? Ct L -spine looks really good. No apparent L psoas hematoma. 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   LOS: 1 day    Rangel Echeverri S 05/04/2012, 8:53 AM

## 2012-05-04 NOTE — Progress Notes (Signed)
Occupational Therapy Treatment Patient Details Name: Brenda Frank MRN: [id number] DOB: Feb 05, 1971 Today's Date: 05/04/2012 Time: 7829-5621 OT Time Calculation (min): 27 min  OT Assessment / Plan / Recommendation Comments on Treatment Session      Follow Up Recommendations  Supervision/Assistance - 24 hour;No OT follow up    Barriers to Discharge       Equipment Recommendations  Rolling walker with 5" wheels;3 in 1 bedside comode;Tub/shower bench    Recommendations for Other Services    Frequency Min 2X/week   Plan      Precautions / Restrictions Precautions Precautions: Back Precaution Comments: Pt educated about log rolling for bed mobility and 3/3 back precautions. Required Braces or Orthoses: Spinal Brace Spinal Brace: Lumbar corset   Pertinent Vitals/Pain See vitals    ADL  Lower Body Bathing: Simulated;Maximal assistance Where Assessed - Lower Body Bathing: Unsupported sitting Lower Body Dressing: Simulated;Maximal assistance Where Assessed - Lower Body Dressing: Unsupported sitting Toilet Transfer: Performed;Minimal assistance Toilet Transfer Method: Stand pivot Toilet Transfer Equipment: Bedside commode Toileting - Clothing Manipulation and Hygiene: Performed;Min guard Where Assessed - Glass blower/designer Manipulation and Hygiene: Sit to stand from 3-in-1 or toilet Equipment Used: Back brace;Gait belt;Rolling walker Transfers/Ambulation Related to ADLs: Min assist with RW ADL Comments: Back brace on in bed upon OT arrival. Pt doffed back brace with supervision when returned to bed.    OT Diagnosis: Generalized weakness;Acute pain  OT Problem List: Decreased strength;Decreased activity tolerance;Decreased knowledge of use of DME or AE;Decreased knowledge of precautions;Pain OT Treatment Interventions: Self-care/ADL training;DME and/or AE instruction;Therapeutic activities;Patient/family education   OT Goals Acute Rehab OT Goals OT Goal Formulation: With  patient Time For Goal Achievement: 05/11/12 Potential to Achieve Goals: Good ADL Goals Pt Will Perform Grooming: with supervision;Standing at sink ADL Goal: Grooming - Progress: Goal set today Pt Will Perform Lower Body Bathing: with supervision;Sit to stand from chair;Sit to stand from bed;with adaptive equipment ADL Goal: Lower Body Bathing - Progress: Goal set today Pt Will Perform Lower Body Dressing: with supervision;Sit to stand from chair;Sit to stand from bed;with adaptive equipment ADL Goal: Lower Body Dressing - Progress: Goal set today Pt Will Transfer to Toilet: with supervision;Anterior-posterior transfer;with DME;Comfort height toilet;Maintaining back safety precautions ADL Goal: Toilet Transfer - Progress: Goal set today Pt Will Perform Tub/Shower Transfer: Tub transfer;with supervision;Ambulation;with DME;Transfer tub bench;Maintaining back safety precautions ADL Goal: Tub/Shower Transfer - Progress: Goal set today Miscellaneous OT Goals Miscellaneous OT Goal #1: Pt will independently verbalize and generalize 3/3 back precautions during all ADL activity. OT Goal: Miscellaneous Goal #1 - Progress: Goal set today Miscellaneous OT Goal #2: Pt will independently don/doff back brace sitting EOB in prep for functional mobility. OT Goal: Miscellaneous Goal #2 - Progress: Goal set today  Visit Information  Last OT Received On: 05/04/12 Assistance Needed: +1    Subjective Data      Prior Functioning  Home Living Lives With: Spouse Available Help at Discharge: Friend(s);Available 24 hours/day;Family;Available PRN/intermittently (spouse available 24/7 during first week) Type of Home: House Home Access: Stairs to enter [employer] of Steps: 3 Entrance Stairs-Rails: Left;Right Bathroom Shower/Tub: Network engineer: None Additional Comments: Spouse is ICU RN at [employer] Prior Function Level of Independence:  Independent Able to Take Stairs?: Yes Driving: Yes Vocation: Full time employment Communication Communication: No difficulties    Cognition  Overall Cognitive Status: Appears within functional limits for tasks assessed/performed Arousal/Alertness: Awake/alert Orientation Level: Appears intact for tasks assessed Behavior  During Session: [facility] for tasks performed    Mobility  Shoulder Instructions Bed Mobility Bed Mobility: Rolling Left;Left Sidelying to Sit;Sitting - Scoot to Edge of Bed;Sit to Sidelying Left Rolling Left: 4: Min guard;With rail Left Sidelying to Sit: 4: Min guard;HOB flat Sitting - Scoot to Edge of Bed: 5: Supervision Sit to Sidelying Left: 3: Mod assist Details for Bed Mobility Assistance: Assist to LE's when performing sit to side-lying. Transfers Transfers: Stand to Sit;Sit to Stand Sit to Stand: 4: Min assist;From bed;From chair/3-in-1;With upper extremity assist Stand to Sit: 4: Min assist;To chair/3-in-1;To bed;With upper extremity assist Details for Transfer Assistance: Cues for safe hand placement.  L LE stable throughout.       Exercises      Balance     End of Session OT - End of Session Equipment Utilized During Treatment: Gait belt;Back brace Activity Tolerance: Patient tolerated treatment well Patient left: in bed;with call bell/phone within reach;with family/visitor present Nurse Communication: Mobility status  GO    05/04/2012 Cipriano Mile OTR/L Pager [phone] Office [phone]  Cipriano Mile 05/04/2012, 3:55 PM

## 2012-05-04 NOTE — Evaluation (Signed)
Physical Therapy Evaluation Patient Details Name: Brenda Frank MRN: [id number] DOB: 1971/08/14 Today's Date: 05/04/2012 Time: 0750-0820 PT Time Calculation (min): 30 min  PT Assessment / Plan / Recommendation Clinical Impression  Pt is a 41 yo female s/p lumbar fusion L2-3, 3-4, 4-5 post op day 1. 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 Pt presented to physical therapy evaluation with decreased strength in  LLE (hip flexor and quad) and decreased mobility secondary to surgery and pain. Pt will benefit from skilled physical therapy in the acute care setting to address these deficits and to maximize function prior to discharge home. Pt expressed frustration at LLE weakness during session and was educated on how injury should continue to get a little better every day.  Recommening HHPT at discharge.    PT Assessment  Patient needs continued PT services    Follow Up Recommendations  Home health PT;Supervision for mobility/OOB    Barriers to Discharge        Equipment Recommendations  Rolling walker with 5" wheels    Recommendations for Other Services     Frequency Min 5X/week    Precautions / Restrictions Precautions Precautions: Back Precaution Comments: Pt educated about log rolling for bed mobility and 3/3 back precautions. Required Braces or Orthoses: Spinal Brace Spinal Brace: Lumbar corset Restrictions Weight Bearing Restrictions: No         Mobility  Bed Mobility Bed Mobility: Rolling Right;Right Sidelying to Sit Rolling Right: 4: Min assist;With rail Right Sidelying to Sit: 4: Min assist;With rails;HOB flat Details for Bed Mobility Assistance: Pt required verbal cueing for log rolling technique (pt initially attempted to go supine->sit) and sequencing. Pt required facilitation at pelvis for rolling onto side and at upper trunk to come to upright sitting EOB. Transfers Transfers: Sit to Stand;Stand to Sit;Stand Pivot  Transfers Sit to Stand: 3: Mod assist;With upper extremity assist;From bed Stand to Sit: 3: Mod assist;To chair/3-in-1;With upper extremity assist;With armrests Stand Pivot Transfers: 1: +2 Total assist;From elevated surface Stand Pivot Transfers: Patient Percentage: 60% Details for Transfer Assistance: Pt  performed SPT to bedside toilet with verbal cueing for safe hand placement and maintaining back precautions, as well as facilitation at pelvis and upper trunk for upright psoture and assist for controlled descent. Pt with improved performance with sit to stand from toilet due to use of the armrests,, continuing to require verbal cueing for safe hand placement and  facilitation at pelvis and upper trunk for upright posture. Ambulation/Gait Ambulation/Gait Assistance: 4: Min assist Ambulation Distance (Feet): 15 Feet Assistive device: Rolling walker Ambulation/Gait Assistance Details: Pt required verbal cueing for safe use of RW. PT required guarding on left side due to pt reported left leg feeling heavy, and pt's spouse reported pt is moving LLE better than last night. Gait Pattern: Step-to pattern;Decreased step length - left;Decreased stride length;Left flexed knee in stance;Antalgic;Decreased trunk rotation;Narrow base of support;Trunk flexed General Gait Details: decreased step height bil, L>R. Stairs: No    Exercises     PT Diagnosis: Difficulty walking;Abnormality of gait;Generalized weakness;Acute pain  PT Problem List: Decreased strength;Decreased range of motion;Decreased activity tolerance;Decreased balance;Decreased mobility;Decreased coordination;Decreased knowledge of use of DME;Decreased knowledge of precautions;Pain PT Treatment Interventions: DME instruction;Gait training;Stair training;Functional mobility training;Therapeutic activities;Therapeutic exercise;Balance training;Neuromuscular re-education;Patient/family education   PT Goals Acute Rehab PT Goals PT Goal  Formulation: With patient Time For Goal Achievement: 05/04/12 Potential to Achieve Goals: Good Pt will Roll Supine to Right Side: with modified independence PT  Goal: Rolling Supine to Right Side - Progress: Progressing toward goal Pt will Roll Supine to Left Side: with modified independence PT Goal: Rolling Supine to Left Side - Progress: Goal set today Pt will go Supine/Side to Sit: with modified independence PT Goal: Supine/Side to Sit - Progress: Goal set today Pt will go Sit to Stand: with supervision PT Goal: Sit to Stand - Progress: Goal set today Pt will go Stand to Sit: with modified independence PT Goal: Stand to Sit - Progress: Goal set today Pt will Ambulate: >150 feet;with modified independence;with least restrictive assistive device PT Goal: Ambulate - Progress: Goal set today Pt will Go Up / Down Stairs: 3-5 stairs;with rail(s);with min assist PT Goal: Up/Down Stairs - Progress: Goal set today Additional Goals Additional Goal #1: Pt will be able to verbalize 3/3 back precautions and demonstrate during all functional mobility. PT Goal: Additional Goal #1 - Progress: Goal set today  Visit Information  Last PT Received On: 05/04/12 Assistance Needed: +1    Subjective Data  Subjective: "I can't lift my left leg."   Prior Functioning  Home Living Lives With: Spouse Available Help at Discharge: Friend(s);Available 24 hours/day;Family;Available PRN/intermittently (spouse available 24 hours for the first week) Type of Home: House Home Access: Stairs to enter [employer] of Steps: 3 Entrance Stairs-Rails: Left;Right Home Layout:  (two steps down to bedroom) Bathroom Shower/Tub: Engineer, manufacturing systems: Standard Home Adaptive Equipment: None Prior Function Level of Independence: Independent Able to Take Stairs?: Yes Driving: Yes Vocation: Full time employment Communication Communication: No difficulties    Cognition  Overall Cognitive Status:  Appears within functional limits for tasks assessed/performed Arousal/Alertness: Awake/alert Orientation Level: Appears intact for tasks assessed Behavior During Session: [facility] for tasks performed    Extremity/Trunk Assessment Right Upper Extremity Assessment RUE ROM/Strength/Tone: [facility] for tasks assessed Left Upper Extremity Assessment LUE ROM/Strength/Tone: WFL for tasks assessed Right Lower Extremity Assessment RLE ROM/Strength/Tone: [facility] for tasks assessed Left Lower Extremity Assessment LLE ROM/Strength/Tone: Deficits LLE ROM/Strength/Tone Deficits: Left quad 2/5 LLE Sensation: Deficits LLE Sensation Deficits: Pt reports LLE feels "like a tree trunk, very heavy." Trunk Assessment Trunk Assessment: Normal   Balance Balance Balance Assessed: No  End of Session PT - End of Session Equipment Utilized During Treatment: Gait belt;Back brace Activity Tolerance: Patient tolerated treatment well Patient left: in chair;with call bell/phone within reach;with family/visitor present Nurse Communication: Mobility status  GP     Brittny Spangle 05/04/2012, 8:54 AM

## 2012-05-04 NOTE — Plan of Care (Signed)
Problem: Consults Goal: Diagnosis - Spinal Surgery Outcome: Completed/Met Date Met:  05/04/12 Lumbar Laminectomy (Complex)

## 2012-05-04 NOTE — Evaluation (Signed)
I have read and agree with the below assessment and plan.   Meli Helen Whitlow PT, DPT Pager: [phone] 

## 2012-05-04 NOTE — Care Management Note (Signed)
    Page 1 of 2   05/08/2012     12:18:54 PM   CARE MANAGEMENT NOTE 05/08/2012  Patient:  Brenda Frank, Brenda Frank   Account Number:  [id number]  Date Initiated:  05/04/2012  Documentation initiated by:  Onnie Boer  Subjective/Objective Assessment:   PT WAS ADMITTED FOR A BACK SURGERY     Action/Plan:   PROGRESSION OF CARE AND DISCHARGE PLANNING   Anticipated DC Date:  05/06/2012   Anticipated DC Plan:  HOME W HOME HEALTH SERVICES      DC Planning Services  CM consult      Choice offered to / List presented to:  C-1 Patient   DME arranged  [facility]  3-N-1      DME agency  [facility]     HH arranged  HH-2 PT      HH agency  [facility]   Status of service:  Completed, signed off Medicare Important Message given?   (If response is "NO", the following Medicare IM given date fields will be blank) Date Medicare IM given:   Date Additional Medicare IM given:    Discharge Disposition:  HOME W HOME HEALTH SERVICES  Per UR Regulation:  Reviewed for med. necessity/level of care/duration of stay  If discussed at Long Length of Stay Meetings, dates discussed:    Comments:  05/04/12 Onnie Boer, RN, BSN 1617 PT WAS ADMITTED FOR BACK SURGERY.  PTA PT WAS AT HOME WITH SELF CARE.  WILL F/U ON DC NEEDS AND RECOMMENDATIONS

## 2012-05-05 NOTE — Progress Notes (Signed)
Patient ID: Brenda Frank, female   DOB: [birth year], 41 y.o.   MRN: [id number] Continue with weakness of quadriceps. No lumbar pain. Pt/ot to continue helping her. i did talk to her about about prognosis.

## 2012-05-05 NOTE — Progress Notes (Signed)
Occupational Therapy Treatment Patient Details Name: Brenda Frank MRN: [id number] DOB: 1970-12-04 Today's Date: 05/05/2012 Time: 4098-1191 OT Time Calculation (min): 26 min  OT Assessment / Plan / Recommendation Comments on Treatment Session Pt is progressing towards goals. Requires min verbal cueing for maintain back precautions.    Follow Up Recommendations  Supervision/Assistance - 24 hour;No OT follow up    Barriers to Discharge       Equipment Recommendations  Rolling walker with 5" wheels;3 in 1 bedside comode;Tub/shower bench    Recommendations for Other Services    Frequency Min 2X/week   Plan Discharge plan remains appropriate    Precautions / Restrictions Precautions Precautions: Back Precaution Comments: Pt able to recall 3/3 back precautions, still requires cues to adhere to them with mobiltiy. Required Braces or Orthoses: Spinal Brace Spinal Brace: Lumbar corset;Applied in sitting position   Pertinent Vitals/Pain See vitals    ADL  Lower Body Bathing: Simulated;Set up Where Assessed - Lower Body Bathing: Unsupported sitting Lower Body Dressing: Performed;Set up Where Assessed - Lower Body Dressing: Unsupported sitting Toilet Transfer: Performed;Min guard Toilet Transfer Method: Stand pivot Acupuncturist: Materials engineer and Hygiene: Performed;Supervision/safety Where Assessed - Engineer, mining and Hygiene: Sit to stand from 3-in-1 or toilet Equipment Used: Gait belt;Back brace;Rolling walker;Long-handled sponge;Reacher;Sock aid Transfers/Ambulation Related to ADLs: min guard with RW ADL Comments: Educated pt on AE and ADL techniques.  Pt able to return demonstration with use of reacher and sock aid.  Also discussed with pt use of toilet aid (or open faced tongs) to maximize independence during toileting hygiene.  Pt has not had BM while in hospital, so is unsure if she will have difficulty but  verbalized understanding of AE if needed.  Informed pt and spouse that AE is available in hospital gift shop.      OT Diagnosis:    OT Problem List:   OT Treatment Interventions:     OT Goals ADL Goals Pt Will Perform Lower Body Bathing: with supervision;Sit to stand from chair;Sit to stand from bed;with adaptive equipment ADL Goal: Lower Body Bathing - Progress: Met Pt Will Perform Lower Body Dressing: with supervision;Sit to stand from chair;Sit to stand from bed;with adaptive equipment ADL Goal: Lower Body Dressing - Progress: Met Pt Will Transfer to Toilet: with supervision;Anterior-posterior transfer;with DME;Comfort height toilet;Maintaining back safety precautions ADL Goal: Toilet Transfer - Progress: Progressing toward goals Miscellaneous OT Goals Miscellaneous OT Goal #1: Pt will independently verbalize and generalize 3/3 back precautions during all ADL activity. OT Goal: Miscellaneous Goal #1 - Progress: Progressing toward goals  Visit Information  Last OT Received On: 05/05/12 Assistance Needed: +1    Subjective Data      Prior Functioning       Cognition  Overall Cognitive Status: Appears within functional limits for tasks assessed/performed Arousal/Alertness: Awake/alert Orientation Level: Appears intact for tasks assessed Behavior During Session: [facility] for tasks performed    Mobility  Shoulder Instructions Bed Mobility Bed Mobility: Rolling Right;Right Sidelying to Sit;Sitting - Scoot to [place] of Bed;Sit to Sidelying Right Rolling Right: 5: Supervision Right Sidelying to Sit: 5: Supervision Sitting - Scoot to Edge of Bed: 5: Supervision Sit to Sidelying Right: 5: Supervision Details for Bed Mobility Assistance: VC's for to technique and to maintain back precautions.  Pt used orange theraband (provided by PT) to hook LLE and assist it into bed. Transfers Sit to Stand: 5: Supervision;From bed;With upper extremity assist;With armrests;From chair/3-in-1 Stand to  Sit:  5: Supervision;To chair/3-in-1;With armrests;With upper extremity assist;To bed Details for Transfer Assistance: Min VC's for safe hand placement.       Exercises    Balance     End of Session OT - End of Session Equipment Utilized During Treatment: Gait belt;Back brace Activity Tolerance: Patient tolerated treatment well Patient left: in bed;with call bell/phone within reach;with family/visitor present Nurse Communication: Mobility status  GO    05/05/2012 Cipriano Mile OTR/L Pager [phone] Office [phone]  Cipriano Mile 05/05/2012, 4:29 PM

## 2012-05-05 NOTE — Progress Notes (Signed)
Physical Therapy Treatment Patient Details Name: Brenda Frank MRN: [id number] DOB: 02-18-1971 Today's Date: 05/05/2012 Time: 2130-8657 PT Time Calculation (min): 30 min  PT Assessment / Plan / Recommendation Comments on Treatment Session  Good progress today with increased activity tolerance, increased gait distance and less assistance needed with mobiltiy. Still needs cues for back precautions/safety with mobility.    Follow Up Recommendations  Home health PT;Supervision for mobility/OOB       Equipment Recommendations  Rolling walker with 5" wheels;3 in 1 bedside comode;Tub/shower bench       Frequency Min 5X/week   Plan Discharge plan remains appropriate;Frequency remains appropriate    Precautions / Restrictions Precautions Precautions: Back Precaution Comments: Pt able to recall 3/3 back precautions, still requires cues to adhere to them with mobiltiy. Required Braces or Orthoses: Spinal Brace Spinal Brace: Lumbar corset;Applied in sitting position (supervision with min cues on technique to don)    Pertinent Vitals/Pain Pt rated pain as 7/10 before and after session in her back. Used her PCA x1 with session.    Mobility  Bed Mobility Rolling Right: 4: Min assist;With rail Right Sidelying to Sit: 4: Min assist;HOB flat;With rails Sitting - Scoot to Edge of Bed: 5: Supervision Details for Bed Mobility Assistance: min vc's/tc's for logroll technique and sequence to sit at edge of bed  (pt initally attempting to sit straight up to edge of bed, needed reminder of precautions and to log roll) Transfers Sit to Stand: 5: Supervision;From bed;With upper extremity assist Stand to Sit: 5: Supervision;To bed;With upper extremity assist Details for Transfer Assistance: cues for safer hand placement with transfers. Ambulation/Gait Ambulation/Gait Assistance: 4: Min guard;5: Supervision Ambulation Distance (Feet): 90 Feet Assistive device: Rolling walker Ambulation/Gait Assistance  Details: min guard assist intially with progression to supervision. cues for posture, to decrease shoulder elevation, for increased bil step length and to increase bilateral foot clearance with gait (no scuffing/shuffling noted, just decreased step height). Gait Pattern: Decreased step length - left;Decreased step length - right;Decreased stride length;Step-to pattern;Left flexed knee in stance;Narrow base of support;Decreased dorsiflexion - left;Decreased dorsiflexion - right;Decreased hip/knee flexion - left General Gait Details: decreased left hip/knee flexion with swing phase and left knee flexion (slight) with stance phase. No buckling throughtout gait trail noted.     Exercises Other Exercises Other Exercises: Educated pt and spouse on Theraband assisted left hip flexion and theraband assisted left LAQ's for left quad and hip flexor strengthening and increased muscle activation with Level 2 band, 8-10 reps each with cues for correct ex form/technique seated on edge of bed.    PT Goals Acute Rehab PT Goals PT Goal: Rolling Supine to Right Side - Progress: Progressing toward goal PT Goal: Supine/Side to Sit - Progress: Progressing toward goal PT Goal: Sit to Stand - Progress: Met PT Goal: Stand to Sit - Progress: Progressing toward goal PT Goal: Ambulate - Progress: Progressing toward goal Additional Goals PT Goal: Additional Goal #1 - Progress: Progressing toward goal  Visit Information  Last PT Received On: 05/05/12 Assistance Needed: +1    Subjective Data  Subjective: No new complaints, primary concern in weakness in left leg.    Cognition  Overall Cognitive Status: Appears within functional limits for tasks assessed/performed Arousal/Alertness: Awake/alert Orientation Level: Appears intact for tasks assessed Behavior During Session: [facility] for tasks performed       End of Session PT - End of Session Equipment Utilized During Treatment: Gait belt;Back brace Activity Tolerance:  Patient tolerated treatment well Patient  left: in bed;with call bell/phone within reach;with family/visitor present Nurse Communication: Mobility status   GP     Sallyanne Kuster 05/05/2012, 1:01 PM  Sallyanne Kuster, PTA Office- [phone]

## 2012-05-06 NOTE — Progress Notes (Signed)
Patient ID: Brenda Frank, female   DOB: Feb 05, 1971, 41 y.o.   MRN: [id number] Stable. C/o pain going to the left leg. Pt helping. New brace in am?

## 2012-05-06 NOTE — Progress Notes (Signed)
Physical Therapy Treatment Patient Details Name: Brenda Frank MRN: [id number] DOB: 03/14/1971 Today's Date: 05/06/2012 Time: 4098-1191 PT Time Calculation (min): 51 min  PT Assessment / Plan / Recommendation Comments on Treatment Session  Continues with improvements with mobility and gait.  Will need to practice stairs again at next session.  Patient continues to voice concern about left leg being "weak".    Follow Up Recommendations  Home health PT;Supervision for mobility/OOB    Barriers to Discharge        Equipment Recommendations  Rolling walker with 5" wheels;3 in 1 bedside comode;Tub/shower bench    Recommendations for Other Services    Frequency Min 5X/week   Plan Discharge plan remains appropriate;Frequency remains appropriate    Precautions / Restrictions Precautions Precautions: Back Precaution Comments: Patient able to recall 3/3 back precautions. Required Braces or Orthoses: Spinal Brace Spinal Brace: Lumbar corset;Applied in sitting position Restrictions Weight Bearing Restrictions: No       Mobility  Bed Mobility Bed Mobility: Rolling Right;Right Sidelying to Sit;Sitting - Scoot to Edge of Bed;Sit to Sidelying Right Rolling Right: 6: Modified independent (Device/Increase time);With rail Right Sidelying to Sit: 5: Supervision;With rails;HOB flat Sitting - Scoot to Edge of Bed: 6: Modified independent (Device/Increase time);With rail Sit to Sidelying Right: 5: Supervision;HOB flat;With rail (with theraband to assist LLE onto bed) Details for Bed Mobility Assistance: Patient able to use theraband to assist LLE onto bed with supervision only.  Patient uses good technique and follows back precautions. Transfers Transfers: Sit to Stand;Stand to Sit Sit to Stand: 5: Supervision;With upper extremity assist;From bed Stand to Sit: 5: Supervision;With upper extremity assist;To bed Details for Transfer Assistance: Patient used safe hand placement.  Cues to keep back  straight during transitions. Ambulation/Gait Ambulation/Gait Assistance: 4: Min guard Ambulation Distance (Feet): 212 Feet (106' x2 with sitting rest break between ) Assistive device: Rolling walker Ambulation/Gait Assistance Details: Min guard for safety due to left knee instability. Gait Pattern: Step-through pattern;Decreased stance time - left Gait velocity: Slow gait speed Stairs: Yes Stairs Assistance: 4: Min assist Stairs Assistance Details (indicate cue type and reason): Instructed patient in correct technique.  Patient feeling unsteady due to left knee instability/weakness. Stair Management Technique: Step to pattern;Forwards;No rails (Hand-hold assist) Number of Stairs: 3     Exercises General Exercises - Lower Extremity Ankle Circles/Pumps: AROM;Both;10 reps;Supine Quad Sets: AROM;Both;10 reps;Supine (Bil. LE's to attempt to engage left quadriceps) Short Arc Quad: AAROM;Left;10 reps;Supine Hip ABduction/ADduction: AAROM;Left;10 reps;Supine    PT Goals Acute Rehab PT Goals PT Goal: Rolling Supine to Right Side - Progress: Met PT Goal: Supine/Side to Sit - Progress: Progressing toward goal PT Goal: Sit to Stand - Progress: Met PT Goal: Stand to Sit - Progress: Progressing toward goal PT Goal: Ambulate - Progress: Progressing toward goal PT Goal: Up/Down Stairs - Progress: Progressing toward goal Additional Goals PT Goal: Additional Goal #1 - Progress: Progressing toward goal  Visit Information  Last PT Received On: 05/06/12 Assistance Needed: +1    Subjective Data  Subjective: I'm upset about my leg.   Cognition  Overall Cognitive Status: Appears within functional limits for tasks assessed/performed Arousal/Alertness: Awake/alert Orientation Level: Appears intact for tasks assessed Behavior During Session: [facility] for tasks performed    Balance     End of Session PT - End of Session Equipment Utilized During Treatment: Gait belt;Back brace Activity Tolerance:  Patient tolerated treatment well Patient left: in bed;with call bell/phone within reach;with family/visitor present Nurse Communication: Mobility status  GP     Vena Austria 05/06/2012, 12:22 PM Durenda Hurt Renaldo Fiddler, [facility] Acute Rehab Services Pager [phone]

## 2012-05-07 ENCOUNTER — Encounter (HOSPITAL_COMMUNITY): Payer: Self-pay | Admitting: Neurological Surgery

## 2012-05-07 NOTE — Progress Notes (Signed)
Patient fell from [facility] on to knees. Spouse in room, Dr. Yetta Barre notified. Bruising noted to left knee. Patient and spouse refused bed alarm and assistance from staff, stating they will call if they need help. Patient wants to be independent.  Olevia Perches

## 2012-05-07 NOTE — Progress Notes (Signed)
I have read and agree with the below treatment note and plan. Klani Helen Whitlow PT, DPT Pager: [phone] 

## 2012-05-07 NOTE — Progress Notes (Signed)
Physical Therapy Treatment Patient Details Name: Brenda Frank MRN: [id number] DOB: Jan 02, 1971 Today's Date: 05/07/2012 Time: 0802-0827 PT Time Calculation (min): 25 min  PT Assessment / Plan / Recommendation Comments on Treatment Session  Nursing reported pt had a fall earlier in the morning but cleared pt for session (pt not c/o increased pain). Pt continues to improve with ambulation and bed mobility. Plan to focus next session on education and "hands on" training for pt's spouse, including guarding during gait and stairs.    Follow Up Recommendations  Home health PT;Supervision for mobility/OOB    Barriers to Discharge        Equipment Recommendations  Rolling walker with 5" wheels;3 in 1 bedside comode;Tub/shower bench    Recommendations for Other Services    Frequency Min 5X/week   Plan Discharge plan remains appropriate;Frequency remains appropriate    Precautions / Restrictions Precautions Precautions: Back;Fall Precaution Comments: Pt at incraesed fall risk due to fall earlier this morning. Required Braces or Orthoses: Spinal Brace Spinal Brace: Lumbar corset;Applied in sitting position Restrictions Weight Bearing Restrictions: No       Mobility  Bed Mobility Bed Mobility: Rolling Right;Right Sidelying to Sit Rolling Right: 6: Modified independent (Device/Increase time);With rail Right Sidelying to Sit: HOB flat;With rails;5: Supervision Details for Bed Mobility Assistance: Pt required verbal cueing to set up a safe environment prior to sitting EOB (tried to sit up with walker and bedside toilet in the way) and to maintain back precautions. Transfers Transfers: Sit to Stand;Stand to Sit Sit to Stand: 4: Min guard;With upper extremity assist;From bed Stand to Sit: 4: Min guard;With upper extremity assist;To chair/3-in-1 Details for Transfer Assistance: Pt requiring min guard with verbal cues for safe hand placement with RW; pt verbalizing anxiety secondary to fall  earlier in the morning (on bedside toilet without nursing present) Ambulation/Gait Ambulation/Gait Assistance: 4: Min guard Ambulation Distance (Feet): 230 Feet Assistive device: Rolling walker Ambulation/Gait Assistance Details: Pt with initail verbal and tactile cueing for safe use of RW, but pt able to improve performance for remainder of session. Pt ambulated to small gym for stair practice, taking a seated rest break after ambulating 115 ft and prior to practicing stairs. Pt then ambulated remaining 115 ft back to her room. Gait Pattern: Step-through pattern;Decreased stance time - left;Decreased step length - right;Trunk flexed;Decreased trunk rotation;Left flexed knee in stance General Gait Details: decreased step height left Stairs Assistance: 4: Min assist Stairs Assistance Details (indicate cue type and reason): Pt required verbal cueing for seqencing and safe foot placement on stairs. Pt reported feeling like her knee was going to buckle when abducting LLE out past a certain point-educated pt on importance of recognizing that point and working just within the safe range. Stair Management Technique: One rail Left;Sideways;Step to pattern    Exercises General Exercises - Lower Extremity Quad Sets: AROM;Both;5 reps;Supine Heel Slides: AAROM;Strengthening;Left;10 reps;Supine   PT Diagnosis:    PT Problem List:   PT Treatment Interventions:     PT Goals Acute Rehab PT Goals PT Goal: Rolling Supine to Right Side - Progress: Met PT Goal: Supine/Side to Sit - Progress: Progressing toward goal PT Goal: Sit to Stand - Progress: Progressing toward goal PT Goal: Stand to Sit - Progress: Progressing toward goal PT Goal: Ambulate - Progress: Progressing toward goal PT Goal: Up/Down Stairs - Progress: Progressing toward goal Additional Goals PT Goal: Additional Goal #1 - Progress: Progressing toward goal  Visit Information  Last PT Received On: 05/07/12 Assistance Needed: +  1      Subjective Data  Subjective: "I fell this morning, but I am ok."   Cognition  Overall Cognitive Status: Appears within functional limits for tasks assessed/performed Arousal/Alertness: Awake/alert Orientation Level: Appears intact for tasks assessed Behavior During Session: [facility] for tasks performed    Balance  Balance Balance Assessed: Yes Dynamic Standing Balance Dynamic Standing - Balance Support: Right upper extremity supported;Left upper extremity supported Dynamic Standing - Level of Assistance: 5: Stand by assistance  End of Session PT - End of Session Equipment Utilized During Treatment: Gait belt;Back brace Activity Tolerance: Patient tolerated treatment well Patient left: in chair;with call bell/phone within reach;with family/visitor present Nurse Communication: Mobility status   GP     Brenda Frank 05/07/2012, 11:02 AM

## 2012-05-07 NOTE — Progress Notes (Signed)
Occupational Therapy Treatment Patient Details Name: Brenda Frank MRN: [id number] DOB: August 15, 1971 Today's Date: 05/07/2012 Time: 4098-1191 OT Time Calculation (min): 9 min  OT Assessment / Plan / Recommendation Comments on Treatment Session Pt is progressing well however still with weakness in the LLE.  Has been educated on AE use and back precautions as well as use of appropriate DME and techniques.  Will have assistance from her wife at discharge per her report.    Follow Up Recommendations  Supervision/Assistance - 24 hour;No OT follow up       Equipment Recommendations  Rolling walker with 5" wheels;3 in 1 bedside comode;Tub/shower bench       Frequency Min 2X/week   Plan Discharge plan remains appropriate    Precautions / Restrictions Precautions Precautions: Back Precaution Comments: Pt able to verbalize 3/3 back precautions with minimal cueing. Required Braces or Orthoses: Spinal Brace Spinal Brace: Lumbar corset;Applied in sitting position Restrictions Weight Bearing Restrictions: No   Pertinent Vitals/Pain Back pain but not rated    ADL  Tub/Shower Transfer: Minimal assistance Tub/Shower Transfer Method: Stand pivot;Ambulating Tub/Shower Transfer Equipment: Counsellor Used: Back brace;Rolling walker Transfers/Ambulation Related to ADLs: Pt overall min guard assist for mobility using her RW. ADL Comments: Practiced useing tub shower bench.  Pt needs assistance lifting LLE over the edge of the tub getting in.  Utilizes therapy band to assist with lifting the LE out of the tub.  Discussed use of hand held shower and use of reacher for drying LEs.  Also discussed method for carrying clothes and towels with the RW.  Encouraged pt to have all throw rugs removed as well.        OT Goals ADL Goals ADL Goal: Tub/Shower Transfer - Progress: Progressing toward goals Miscellaneous OT Goals OT Goal: Miscellaneous Goal #1 - Progress: Progressing toward  goals  Visit Information  Last OT Received On: 05/07/12 Assistance Needed: +1    Subjective Data  Subjective: "I can use my band to help lift my leg over in the tub." Patient Stated Goal: Did not state.      Cognition  Overall Cognitive Status: Appears within functional limits for tasks assessed/performed Arousal/Alertness: Awake/alert Orientation Level: Appears intact for tasks assessed Behavior During Session: [facility] for tasks performed    Mobility  Shoulder Instructions Bed Mobility Bed Mobility: Rolling Right;Right Sidelying to Sit Rolling Right: 6: Modified independent (Device/Increase time);With rail Right Sidelying to Sit: HOB flat;With rails;5: Supervision Details for Bed Mobility Assistance: Pt required verbal cueing to set up a safe environment prior to sitting EOB (tried to sit up with walker and bedside toilet in the way) and to maintain back precautions. Transfers Sit to Stand: 4: Min guard;With upper extremity assist;From bed Stand to Sit: 4: Min guard;With upper extremity assist;To chair/3-in-1 Details for Transfer Assistance: Pt requiring minimal verbal cueing for safe hand placement and min guard due to pt expressing anxiety about getting OOB, since pt reports having fallen earlier that morning while using bedside toilet (without having called nursing).       Exercises  General Exercises - Lower Extremity Quad Sets: AROM;Both;5 reps;Supine Heel Slides: AAROM;Strengthening;Left;10 reps;Supine   Balance Balance Balance Assessed: Yes Dynamic Standing Balance Dynamic Standing - Balance Support: Right upper extremity supported;Left upper extremity supported Dynamic Standing - Level of Assistance: 5: Stand by assistance   End of Session OT - End of Session Equipment Utilized During Treatment: Gait belt;Back brace Activity Tolerance: Patient tolerated treatment well Patient left: Other (comment) (  With PT to finish session)     Cing  OTR/L Pager number  [phone] 05/07/2012, 9:49 AM

## 2012-05-07 NOTE — Progress Notes (Signed)
Patient ID: Brenda Frank, female   DOB: 1971-07-14, 41 y.o.   MRN: [id number] Pt and sig other feels she has made 'amazing progress" over the weekend. L quad still weak but mobilization better. Very little LBP. Some ant leg pain to knee. L quad 1/5 still...expect slow recovery over 3-9 months. Continue pt, d/c pca.

## 2012-05-08 MED ORDER — OXYCODONE HCL 10 MG PO TABS: 10.0000 mg | ORAL_TABLET | ORAL | Status: AC | PRN

## 2012-05-08 MED ORDER — CYCLOBENZAPRINE HCL 10 MG PO TABS: 10.0000 mg | ORAL_TABLET | Freq: Three times a day (TID) | ORAL | Status: AC | PRN

## 2012-05-08 NOTE — Discharge Summary (Signed)
Physician Discharge Summary  Patient ID: Brenda Frank MRN: [id number] DOB/AGE: August 26, 1971 41 y.o.  Admit date: 05/03/2012 Discharge date: 05/08/2012  Admission Diagnoses: lumbar spondylosis, DDD, Back pain, leg pain   Discharge Diagnoses: same   Discharged Condition: fair  Hospital Course: The patient was admitted on 05/03/2012 and taken to the operating room where the patient underwent XLIF L3-4, L4-5, L5-S1. The patient tolerated the procedure well and was taken to the recovery room and then to the floor in stable condition. The hospital course was routine except for unexpected L quad weakness post-op. CT L-spine ruled out misplaced hardware and psoas hematoma as causes. 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 The wounds remained clean dry and intact. Pt had appropriate back soreness.  The patient remained afebrile with stable vital signs, and tolerated a regular diet. The patient continued to increase activities and began to walk fairly well with a walker with the help of therapists, and pain was well controlled with oral pain medications. HH PT/OT was ordered and she was sent home with plans to f/u in 3 weeks.  Consults: None  Significant Diagnostic Studies:  Results for orders placed during the hospital encounter of 05/03/12  GLUCOSE, CAPILLARY      Component Value Range   Glucose-Capillary 95  70 - 99 mg/dL  POCT I-STAT 4, (NA,K, GLUC, HGB,HCT)      Component Value Range   Sodium 139  135 - 145 mEq/L   Potassium 4.2  3.5 - 5.1 mEq/L   Glucose, Bld 144 (*) 70 - 99 mg/dL   HCT 40.9 (*) 81.1 - 91.4 %   Hemoglobin 16.0 (*) 12.0 - 15.0 g/dL  GLUCOSE, CAPILLARY      Component Value Range   Glucose-Capillary 176 (*) 70 - 99 mg/dL    Dg Lumbar Spine 2-3 Views  05/03/2012  *RADIOLOGY REPORT*  Clinical Data: L2-3, L3-4, and L4-5 XLIF.  DG C-ARM GT 120 MIN,LUMBAR SPINE - 2-3 VIEW  Technique: Five intraoperative lumbar spine radiographs are  submitted.  Comparison:  Lumbar discogram 03/23/2012.  Findings: The patient is status post fusion at L2-3, L3-4, and L4- 5.  The disc heights are restored.  Alignment is anatomic.  The spacers are in satisfactory position.  Right pedicle screw and rod fixation is in place at these levels. The screws are well positioned.  IMPRESSION:  1.  Status post L2-3, L3-4, and L4-5 XLIF. 2.  No radiographic evidence for complication.   Original Report Authenticated By: Jamesetta Orleans. MATTERN, M.D.    Ct Lumbar Spine Wo Contrast  05/04/2012  *RADIOLOGY REPORT*  Clinical Data: Left leg weakness.  Lumbar fusion today.  CT LUMBAR SPINE WITHOUT CONTRAST  Technique:  Multidetector CT imaging of the lumbar spine was performed without intravenous contrast administration. Multiplanar CT image reconstructions were also generated.  Comparison: CT scan dated 03/23/2012 and radiographs dated 05/03/2012  Findings: T11-12 through L1-2 are normal.  L2-3:  Interbody fusion device and left pedicle screws are in excellent position.  Air is seen in the psoas muscles bilaterally with a small bone fragment in the left psoas muscle.  L3-4:  Pedicle screws and interbody fusion device are in excellent position.  No disc protrusion.  Air is seen in the psoas muscles bilaterally with slight swelling of the left psoas muscle.  L4-5:  The pedicle screws and interbody fusion device are in excellent position.  Fairly extensive air seen in the right psoas muscle and there  are bone chips in the left psoas muscle with slight edema of the muscle.  There is a far lateral bulge of the disc lateral to the left neural foramen but the left L4 nerve is not impinged upon.  The L4-5 S1:  The disc is normal.  Minimal hypertrophy of the facet joints.  IMPRESSION: Postsurgical changes from the fusion as expected.  The interbody fusion devices and the pedicle screws and posterior rod appear in excellent position.  No impingement upon the spinal canal, neural foramina, or  nerve roots.   Original Report Authenticated By: Gwynn Burly, M.D.    Dg C-arm Gt 120 Min  05/03/2012  *RADIOLOGY REPORT*  Clinical Data: L2-3, L3-4, and L4-5 XLIF.  DG C-ARM GT 120 MIN,LUMBAR SPINE - 2-3 VIEW  Technique: Five intraoperative lumbar spine radiographs are submitted.  Comparison:  Lumbar discogram 03/23/2012.  Findings: The patient is status post fusion at L2-3, L3-4, and L4- 5.  The disc heights are restored.  Alignment is anatomic.  The spacers are in satisfactory position.  Right pedicle screw and rod fixation is in place at these levels. The screws are well positioned.  IMPRESSION:  1.  Status post L2-3, L3-4, and L4-5 XLIF. 2.  No radiographic evidence for complication.   Original Report Authenticated By: Jamesetta Orleans. MATTERN, M.D.     Antibiotics:  Anti-infectives     Start     Dose/Rate Route Frequency Ordered Stop   05/03/12 2000   ceFAZolin (ANCEF) IVPB 1 g/50 mL premix        1 g 100 mL/hr over 30 Minutes Intravenous Every 8 hours 05/03/12 1807 05/04/12 0542   05/03/12 1147   ceFAZolin (ANCEF) 1-5 GM-% IVPB     Comments: Trellis Paganini: cabinet override         05/03/12 1147 05/03/12 2359   05/03/12 1029   bacitracin 46962 UNITS injection     Comments: Trellis Paganini: cabinet override         05/03/12 1029 05/03/12 2229   05/03/12 0840   bacitracin 50,000 Units in sodium chloride irrigation 0.9 % 500 mL irrigation  Status:  Discontinued          As needed 05/03/12 0842 05/03/12 1424   05/03/12 0704   bacitracin 95284 UNITS injection     Comments: KEY, JENNIFER: cabinet override         05/03/12 0704 05/03/12 1914   05/03/12 0612   ceFAZolin (ANCEF) IVPB 1 g/50 mL premix  Status:  Discontinued        1 g 100 mL/hr over 30 Minutes Intravenous 60 min pre-op 05/03/12 0612 05/03/12 0613   05/03/12 0000   ceFAZolin (ANCEF) IVPB 2 g/50 mL premix  Status:  Discontinued        2 g 100 mL/hr over 30 Minutes Intravenous 60 min pre-op 05/02/12 1436 05/03/12 1627            Discharge Exam: Blood pressure 110/60, pulse 64, temperature 98.6 F (37 C), temperature source Oral, resp. rate 18, height 5\' 7"  (1.702 m), weight 106.6 kg (235 lb 0.2 oz), SpO2 96.00%. Neurologic: Grossly normal except for L quad weakness incisons CDI  Discharge Medications:   Medication List  As of 05/08/2012  8:37 AM   STOP taking these medications         HYDROcodone-acetaminophen 10-325 MG per tablet         TAKE these medications         albuterol (2.5 MG/3ML)  0.083% nebulizer solution   Commonly known as: PROVENTIL   Take 2.5 mg by nebulization every 6 (six) hours as needed. For shortness of breath      albuterol 108 (90 BASE) MCG/ACT inhaler   Commonly known as: PROVENTIL HFA;VENTOLIN HFA   Inhale 2 puffs into the lungs every 6 (six) hours as needed. For RESCUE.      clonazePAM 2 MG tablet   Commonly known as: KLONOPIN   Take 2 mg by mouth 2 (two) times daily as needed. For anxiety      cyclobenzaprine 10 MG tablet   Commonly known as: FLEXERIL   Take 1 tablet (10 mg total) by mouth 3 (three) times daily as needed for muscle spasms.      dicyclomine 10 MG capsule   Commonly known as: BENTYL   Take 10 mg by mouth 3 (three) times daily as needed. For IBS      hydrochlorothiazide 25 MG tablet   Commonly known as: HYDRODIURIL   Take 25 mg by mouth as needed.      lithium carbonate 300 MG capsule   Take 600-900 mg by mouth 2 (two) times daily. 2 capsules in the morning and 3 capsules at night      Oxycodone HCl 10 MG Tabs   Take 1 tablet (10 mg total) by mouth every 4 (four) hours as needed.      pantoprazole 40 MG tablet   Commonly known as: PROTONIX   Take 40 mg by mouth daily.      telmisartan 80 MG tablet   Commonly known as: MICARDIS   Take 80 mg by mouth daily.      tiotropium 18 MCG inhalation capsule   Commonly known as: SPIRIVA   Place 18 mcg into inhaler and inhale daily.            Disposition: home   Final Dx:  XLIF  Discharge Orders    Future Appointments: Provider: Department: Dept Phone: Center:   05/29/2012 2:00 PM Malissa Hippo, MD [facility] December [phone] None     Future Orders Please Complete By Expires   Diet - low sodium heart healthy      Increase activity slowly      Discharge instructions      Comments:   No bending, twisting, or heavy lifting   Driving Restrictions      Comments:   At least 1 month   Lifting restrictions      Comments:   Less than 8 lbs   No wound care      Call MD for:  temperature >100.4      Call MD for:  persistant nausea and vomiting      Call MD for:  severe uncontrolled pain      Call MD for:  redness, tenderness, or signs of infection (pain, swelling, redness, odor or green/yellow discharge around incision site)      Call MD for:  difficulty breathing, headache or visual disturbances         Follow-up Information    Follow up with Zimal Weisensel S, MD. Schedule an appointment as soon as possible for a visit in 3 weeks.   Contact information:   [address] [place] [address] [phone]           Signed: Tia Alert 05/08/2012, 8:37 AM

## 2012-05-29 ENCOUNTER — Ambulatory Visit (INDEPENDENT_AMBULATORY_CARE_PROVIDER_SITE_OTHER): Payer: 59 | Admitting: Internal Medicine

## 2012-06-20 ENCOUNTER — Ambulatory Visit (HOSPITAL_COMMUNITY)
Admission: RE | Admit: 2012-06-20 | Discharge: 2012-06-20 | Disposition: A | Discharge status: Home / Self Care | Payer: 59 | Source: Ambulatory Visit | Attending: Neurological Surgery | Admitting: Neurological Surgery

## 2012-06-20 DIAGNOSIS — M545 Low back pain, unspecified: Secondary | ICD-10-CM | POA: Insufficient documentation

## 2012-06-20 DIAGNOSIS — J4489 Other specified chronic obstructive pulmonary disease: Secondary | ICD-10-CM | POA: Insufficient documentation

## 2012-06-20 DIAGNOSIS — J449 Chronic obstructive pulmonary disease, unspecified: Secondary | ICD-10-CM | POA: Insufficient documentation

## 2012-06-20 DIAGNOSIS — R29898 Other symptoms and signs involving the musculoskeletal system: Secondary | ICD-10-CM | POA: Insufficient documentation

## 2012-06-20 DIAGNOSIS — I10 Essential (primary) hypertension: Secondary | ICD-10-CM | POA: Insufficient documentation

## 2012-06-20 DIAGNOSIS — R262 Difficulty in walking, not elsewhere classified: Secondary | ICD-10-CM | POA: Insufficient documentation

## 2012-06-20 DIAGNOSIS — IMO0001 Reserved for inherently not codable concepts without codable children: Secondary | ICD-10-CM | POA: Insufficient documentation

## 2012-06-20 DIAGNOSIS — E119 Type 2 diabetes mellitus without complications: Secondary | ICD-10-CM | POA: Insufficient documentation

## 2012-06-20 DIAGNOSIS — M6281 Muscle weakness (generalized): Secondary | ICD-10-CM | POA: Insufficient documentation

## 2012-06-20 NOTE — Evaluation (Signed)
Physical Therapy Evaluation  Patient Details  Name: Brenda Frank MRN: [id number] Date of Birth: 07-13-1971  Today's Date: 06/20/2012 Time: 7829-5621 PT Time Calculation (min): 58 min  Visit#: 1  of 18   Re-eval: 07/20/12 Assessment Diagnosis: S/P lumbar surgery Surgical Date: 05/03/12 Next MD Visit: 07/19/12  Authorization: [employer] -goal update on Wed.   Past Medical History:  Past Medical History  Diagnosis Date  . Hypertension   . Diverticulosis   . 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 Polycythemia 10/25/2011    Query secondary to smoking or hypoxia. Hemoglobin 16-17 g  . Cecal ulcer 10/26/2011    Per colonoscopy  . Diverticulosis 10/26/2011    Per colonoscopy  . GERD (gastroesophageal reflux disease)   . Hernia, inguinal     rt  . Disorder of vocal cords     done  when intubated for hysterectomy  2008  . Chronic bronchitis 05/03/2012    "gets it 1-2X q yr"  . COPD with emphysema   . Pneumonia 2007  . Exertional dyspnea     "because of the COPD"  . Diabetes mellitus     no rx for 3-4 yrs  lost weight; "diet controlled" (05/03/2012)  . History of duodenal ulcer   . Chronic lower back pain   . Bipolar affective     "under control last 12 years" (05/03/2012)  . Skin cancer of face     "had it taken off"   Past Surgical History:  Past Surgical History  Procedure Date  . Colonoscopy 10/25/2011    Procedure: COLONOSCOPY;  Surgeon: Malissa Hippo, MD;  [facility];  Service: Endoscopy;  Laterality: N/A;  . Lateral / posterior combined fusion lumbar spine 05/03/2012  . Hernia repair 2000's    "2 abdominal hernias repaired"  . Bilateral oophorectomy 2012  . Abdominal hysterectomy 2000's  . Appendectomy 2000's  . Cholecystectomy 2009  . Skin cancer excision     facial  . Anterior lat lumbar fusion 05/03/2012    Procedure: ANTERIOR LATERAL LUMBAR FUSION 3 LEVELS;  Surgeon: Tia Alert, MD;  Location: [facility];  Service: Neurosurgery;  Laterality: Left;   Left lumbar two-three, lumbar three-four, lumbar four-five anteriolateral retroperitoneal interbody fusion with pedicle screws    Subjective Symptoms/Limitations Symptoms: Brenda Frank states that she opted to have surgery on her lumbar spine on 05/13/2012.  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  The pt had HH therapy for a month she is now being referred for physcial therapy.  She states that she is still having difficulty moving her leg.  She states her knee buckles and she has fallen several times.  The patient states that she becomes very fatigued with very little exertion, (walking in the house).  She states that she is still using a bedside commode as she can not get off of her toliet yet.  She states that the back surgery itself seems to have been successful as she does not have the same pain that she did.  She has been referred to therapy to try and return her to her previous functioning level. How long can you sit comfortably?: able to sit for 30 minutes. How long can you stand comfortably?: two minutes How long can you walk comfortably?: Pt walks with the walker for five minutes. Special Tests: Pt is  sleeping in her recliner still Pain Assessment Currently in Pain?: Yes Pain Score:   5 Pain Location: Knee Pain Orientation: Left Pain Type: Acute pain Pain Onset: 1 to 4 weeks ago Pain Frequency: Constant Effect of Pain on Daily Activities: increases Multiple Pain Sites: Yes  Precautions/Restrictions  Precautions Precautions: Fall  Prior Functioning  Home Living Lives With: Significant other Type of Home: House Home Access: Stairs to enter [employer] of Steps: 5 (goes up sideways) Prior Function Vocation: On disability Leisure: Hobbies-yes (Comment) Comments: walking  Cognition/Observation Cognition Overall Cognitive Status: Appears within functional limits  for tasks assessed  Sensation/Coordination/Flexibility/Functional Tests Functional Tests Functional Tests: LEFS 8/80  Assessment RLE Strength Right Hip Flexion: 5/5 Right Hip Extension: 3/5 Right Hip ABduction: 5/5 Right Knee Flexion: 5/5 Right Knee Extension: 5/5 Right Ankle Dorsiflexion: 3+/5 Right Ankle Plantar Flexion: 3-/5 LLE Strength Left Hip Flexion: 2-/5 Left Hip Extension: 2-/5 Left Hip ABduction: 2/5 Left Hip ADduction: 2-/5 Left Knee Flexion: 3+/5 Left Knee Extension: 3-/5 Left Ankle Dorsiflexion: 3+/5 Left Ankle Plantar Flexion: 3-/5  Exercise/Treatments Seated Long Arc Quad: Left;5 reps Other Seated Knee Exercises: heel/toe raises x 5 Supine Quad Sets: 5 reps Heel Slides: 5 reps Hip Adduction Isometric: 5 reps Bridges: 5 reps Other Supine Knee Exercises: ab/adduction Other Supine Knee Exercises: clam x 5  Physical Therapy Assessment and Plan PT Assessment and Plan Clinical Impression Statement: Pt s/p back surgery with complication of femoral nerve stretching who has decreased L LE strength.  Pt will benefit from skilled Pt to return pt to prior level and improve her qualityt of life. Pt will benefit from skilled therapeutic intervention in order to improve on the following deficits: Decreased activity tolerance;Decreased balance;Decreased mobility;Decreased strength;Difficulty walking;Pain Rehab Potential: Good PT Frequency: Min 3X/week PT Duration: 6 weeks PT Treatment/Interventions: Gait training;Functional mobility training;Therapeutic activities;Therapeutic exercise;Balance training;Patient/family education;Stair training PT Plan: Pt to begin bike, side-stepping, heelraises, minisquats, church pews, terminal extesion, marching in place;  2nd treatment add rockerboard; lateral step up in // ; 3rd add forward step up , forward/side lunge; 4th SLS    Goals Home Exercise Program Pt will Perform Home Exercise Program: Independently PT Short Term  Goals Time to Complete Short Term Goals: 2 weeks PT Short Term Goal 1: Pt to be able to stand for 10 minutes PT Short Term Goal 2: Pt to be able to walk with walker x 15 minutes  PT Short Term Goal 3: Pain level to be no more than a 5/10 PT Long Term Goals Time to Complete Long Term Goals:  (6 weeks) PT Long Term Goal 1: Pt to be ambulating with a cane PT Long Term Goal 2: Pt strength to have increased by 1 grade in her L LE Long Term Goal 3: Pt to be able to walk for 45 minutes Long Term Goal 4: Pt pain to be no more than a 2 80% of the day PT Long Term Goal 5: able to ascend descend steps with hand rail Additional PT Long Term Goals?: Yes PT Long Term Goal 6: able to get on and off regular toliet PT Long Term Goal 7: able to sleep in her own bed  Problem List Patient Active Problem List  Diagnosis  . DIABETES MELLITUS  . HYPERTENSION  . OBSTRUCTIVE CHRONIC BRONCHITIS WITHOUT EXACERBAT  . BIPOLAR AFFECTIVE DISORDER, HX OF  . RUQ pain  . Constipation  . Chronic pain  . Tobacco abuse  . Hepatic cyst  . Polycythemia  . Cecal ulcer  .  GI bleed  . Weakness of left leg  . Difficulty in walking    PT - End of Session Activity Tolerance: Patient tolerated treatment well General Behavior During Session: [facility] for tasks performed Cognition: [facility] for tasks performed PT Plan of Care PT Home Exercise Plan: given Consulted and Agree with Plan of Care: Patient  GP    Dayannara Pascal,CINDY 06/20/2012, 2:28 PM  Physician Documentation Your signature is required to indicate approval of the treatment plan as stated above.  Please sign and either send electronically or make a copy of this report for your files and return this physician signed original.   Please mark one 1.__approve of plan  2. ___approve of plan with the following conditions.   ______________________________                                                          _____________________ Physician Signature                                                                                                              Date

## 2012-06-26 ENCOUNTER — Ambulatory Visit (HOSPITAL_COMMUNITY)
Admission: RE | Admit: 2012-06-26 | Discharge: 2012-06-26 | Disposition: A | Discharge status: Home / Self Care | Payer: 59 | Source: Ambulatory Visit | Attending: Neurological Surgery | Admitting: Neurological Surgery

## 2012-06-26 NOTE — Progress Notes (Signed)
Physical Therapy Treatment Patient Details  Name: Brenda Frank MRN: [id number] Date of Birth: 07/07/1971  Today's Date: 06/26/2012 Time: 7829-5621 PT Time Calculation (min): 43 min  Visit#: 2  of 18   Re-eval: 07/20/12 Charges: Therex x 40'  Authorization: [employer] -goal update on Wed.    Subjective: Symptoms/Limitations Symptoms: Pt reports HEP comliance. Pain Assessment Currently in Pain?: Yes Pain Score:   3 Pain Location: Back Pain Orientation: Lower   Exercise/Treatments Aerobic Stationary Bike: 6'@3 .5 seat 10 for strengthening an activity tolerance Standing Heel Raises: 10 reps;Limitations Heel Raises Limitations: Toe raises x 10 Terminal Knee Extension: 10 reps;Theraband Theraband Level (Terminal Knee Extension): Level 4 (Blue) Functional Squat: 10 reps Other Standing Knee Exercises: Slow tall march in place x 10 Other Standing Knee Exercises: Side stepping 1 RT; church pews x 10 Seated Long Arc Quad:  (Time) Other Seated Knee Exercises:  (Time) Supine Quad Sets: 10 reps Heel Slides:  (Time) Hip Adduction Isometric: 10 reps Bridges: 10 reps Other Supine Knee Exercises:  (Time) Other Supine Knee Exercises:  (Time)  Physical Therapy Assessment and Plan PT Assessment and Plan Clinical Impression Statement: Pt tolerates standing therex well. Pt requires multimodal cueing for form with functional squats. Pt displays visible quad fatigue with church pews and standing TKE. Pt is without complaint throughout session. PT Plan: Continue per PT POC. Next treatment add rockerboard; lateral step up in // ; 3rd add forward step up , forward/side lunge; 4th SLS.     Problem List Patient Active Problem List  Diagnosis  . DIABETES MELLITUS  . HYPERTENSION  . OBSTRUCTIVE CHRONIC BRONCHITIS WITHOUT EXACERBAT  . BIPOLAR AFFECTIVE DISORDER, HX OF  . RUQ pain  . Constipation  . Chronic pain  . Tobacco abuse  . Hepatic cyst  . Polycythemia  . Cecal ulcer  . GI bleed  .  Weakness of left leg  . Difficulty in walking    PT - End of Session Activity Tolerance: Patient tolerated treatment well General Behavior During Session: [facility] for tasks performed Cognition: [facility] for tasks performed  Seth Bake, PTA 06/26/2012, 3:22 PM

## 2012-06-27 ENCOUNTER — Telehealth (HOSPITAL_COMMUNITY): Payer: Self-pay

## 2012-06-27 ENCOUNTER — Ambulatory Visit (HOSPITAL_COMMUNITY): Payer: 59

## 2012-06-29 ENCOUNTER — Ambulatory Visit (HOSPITAL_COMMUNITY)
Admission: RE | Admit: 2012-06-29 | Discharge: 2012-06-29 | Disposition: A | Discharge status: Home / Self Care | Payer: 59 | Source: Ambulatory Visit | Attending: Neurological Surgery | Admitting: Neurological Surgery

## 2012-06-29 DIAGNOSIS — J449 Chronic obstructive pulmonary disease, unspecified: Secondary | ICD-10-CM | POA: Insufficient documentation

## 2012-06-29 DIAGNOSIS — R262 Difficulty in walking, not elsewhere classified: Secondary | ICD-10-CM

## 2012-06-29 DIAGNOSIS — I10 Essential (primary) hypertension: Secondary | ICD-10-CM | POA: Insufficient documentation

## 2012-06-29 DIAGNOSIS — M6281 Muscle weakness (generalized): Secondary | ICD-10-CM | POA: Insufficient documentation

## 2012-06-29 DIAGNOSIS — IMO0001 Reserved for inherently not codable concepts without codable children: Secondary | ICD-10-CM | POA: Insufficient documentation

## 2012-06-29 DIAGNOSIS — E119 Type 2 diabetes mellitus without complications: Secondary | ICD-10-CM | POA: Insufficient documentation

## 2012-06-29 DIAGNOSIS — J4489 Other specified chronic obstructive pulmonary disease: Secondary | ICD-10-CM | POA: Insufficient documentation

## 2012-06-29 DIAGNOSIS — R29898 Other symptoms and signs involving the musculoskeletal system: Secondary | ICD-10-CM

## 2012-06-29 DIAGNOSIS — M545 Low back pain, unspecified: Secondary | ICD-10-CM | POA: Insufficient documentation

## 2012-06-29 NOTE — Progress Notes (Signed)
Physical Therapy Treatment Patient Details  Name: FALYN RUBEL MRN: [id number] Date of Birth: 11/20/1970  Today's Date: 06/29/2012 Time: 7829-5621 PT Time Calculation (min): 43 min  Visit#: 3  of 18   Re-eval: 07/20/12  Charge: therex 40'  Authorization: [employer]   Subjective: Symptoms/Limitations Symptoms: Pt stated increase soreness today.  LBP and L knee pain scale 4/10. Pain Assessment Currently in Pain?: Yes Pain Score:   4 Pain Location: Back Pain Orientation: Lower  Objective:   Exercise/Treatments Aerobic Stationary Bike: 6'@4 .0 seat 10 for strengthening an activity tolerance Standing Heel Raises: 10 reps;Limitations Heel Raises Limitations: Toe raises x 10 Lateral Step Up: Left;10 reps;Step Height: 2";Hand Hold: 2;Limitations Lateral Step Up Limitations: inside parallel bars Forward Step Up: Left;10 reps;Step Height: 2";Limitations Forward Step Up Limitations: inside parallel bars Functional Squat: 10 reps Other Standing Knee Exercises: Slow tall march in place x 10 Other Standing Knee Exercises: Side stepping 1 RT; church pews x 10 Seated Other Seated Knee Exercises: heel/toe roll in/out 10 x  Supine Quad Sets: 10 reps Hip Adduction Isometric: 10 reps Bridges: 10 reps Other Supine Knee Exercises: abduction with blue tband    Physical Therapy Assessment and Plan PT Assessment and Plan Clinical Impression Statement: Added rocker board to improve weight distribution with gait mechanics and began forward/lateral step ups for quad strengthening.  Pt required multimodal cueing to reduce hip hiking with lateral step ups due to quad weakness and decreased coordination.  Improved quad sets noted following manual facilitation to distal quad. PT Plan: Continue with current PT POC.  Next session begin forward/side lunges.  4th session begin SLS.    Goals    Problem List Patient Active Problem List  Diagnosis  . DIABETES MELLITUS  . HYPERTENSION  . OBSTRUCTIVE  CHRONIC BRONCHITIS WITHOUT EXACERBAT  . BIPOLAR AFFECTIVE DISORDER, HX OF  . RUQ pain  . Constipation  . Chronic pain  . Tobacco abuse  . Hepatic cyst  . Polycythemia  . Cecal ulcer  . GI bleed  . Weakness of left leg  . Difficulty in walking    PT - End of Session Activity Tolerance: Patient tolerated treatment well General Behavior During Session: [facility] for tasks performed Cognition: [facility] for tasks performed  GP    Juel Burrow 06/29/2012, 5:22 PM

## 2012-07-02 ENCOUNTER — Ambulatory Visit (HOSPITAL_COMMUNITY): Payer: 59 | Admitting: *Deleted

## 2012-07-03 ENCOUNTER — Ambulatory Visit (HOSPITAL_COMMUNITY): Payer: 59 | Admitting: *Deleted

## 2012-07-04 ENCOUNTER — Ambulatory Visit (HOSPITAL_COMMUNITY): Payer: 59 | Admitting: Physical Therapy

## 2012-07-05 ENCOUNTER — Ambulatory Visit (HOSPITAL_COMMUNITY)
Admission: RE | Admit: 2012-07-05 | Discharge: 2012-07-05 | Disposition: A | Discharge status: Home / Self Care | Payer: 59 | Source: Ambulatory Visit | Attending: Internal Medicine | Admitting: Internal Medicine

## 2012-07-05 NOTE — Progress Notes (Signed)
Physical Therapy Treatment Patient Details  Name: Brenda Frank MRN: [id number] Date of Birth: 01-22-1971  Today's Date: 07/05/2012 Time: 4098-1191 PT Time Calculation (min): 40 min  Visit#: 4  of 18   Re-eval: 07/20/12 Charges: Therex x 38'  Authorization: [employer]    Subjective: Symptoms/Limitations Symptoms: Pt reports increased pain today. Pain Assessment Currently in Pain?: Yes Pain Score:   8 Pain Location: Back Pain Orientation: Lower   Exercise/Treatments Stretches Passive Hamstring Stretch: 3 reps;30 seconds;Limitations Passive Hamstring Stretch Limitations: With rope Single Knee to Chest Stretch: 3 reps;30 seconds Standing Other Standing Lumbar Exercises: Lumbar ext x 5 Supine Ab Set: 10 reps;5 seconds Bridge: 10 reps Other Supine Lumbar Exercises: Adductor ball squeeze 10x5"  Physical Therapy Assessment and Plan PT Assessment and Plan Clinical Impression Statement: Tx limited by LBP. Modified tx to focus on stretching. Pt presents with tightness in B hips and hamstrings. Pt reports pain decrease to 4/10. PT Plan: Continue with current PT POC.  Return to standing therex as pain allows.     Problem List Patient Active Problem List  Diagnosis  . DIABETES MELLITUS  . HYPERTENSION  . OBSTRUCTIVE CHRONIC BRONCHITIS WITHOUT EXACERBAT  . BIPOLAR AFFECTIVE DISORDER, HX OF  . RUQ pain  . Constipation  . Chronic pain  . Tobacco abuse  . Hepatic cyst  . Polycythemia  . Cecal ulcer  . GI bleed  . Weakness of left leg  . Difficulty in walking    PT - End of Session Activity Tolerance: Patient tolerated treatment well General Behavior During Session: [facility] for tasks performed Cognition: [facility] for tasks performed  Seth Bake, PTA 07/05/2012, 4:09 PM

## 2012-07-06 ENCOUNTER — Inpatient Hospital Stay (HOSPITAL_COMMUNITY): Admission: RE | Admit: 2012-07-06 | Payer: 59 | Source: Ambulatory Visit

## 2012-07-09 ENCOUNTER — Ambulatory Visit (HOSPITAL_COMMUNITY)
Admission: RE | Admit: 2012-07-09 | Discharge: 2012-07-09 | Disposition: A | Discharge status: Home / Self Care | Payer: 59 | Source: Ambulatory Visit | Attending: Neurological Surgery | Admitting: Neurological Surgery

## 2012-07-09 NOTE — Progress Notes (Signed)
Physical Therapy Treatment Patient Details  Name: Brenda Frank MRN: [id number] Date of Birth: May 21, 1971  Today's Date: 07/09/2012 Time: 2841-3244 PT Time Calculation (min): 42 min Visit#: 5  of 18   Re-eval: 07/20/12 Charges:  therex 38', MHP X 1 unit     Subjective: Symptoms/Limitations Symptoms: Pt reports pain is still increased today.  More pain on the L side into hip area. Pain Assessment Currently in Pain?: Yes Pain Score:   8 Pain Location: Back Pain Orientation: Left;Lower   Exercise/Treatments Stretches Passive Hamstring Stretch: 3 reps;30 seconds;Limitations Passive Hamstring Stretch Limitations: With rope Single Knee to Chest Stretch: 3 reps;30 seconds Lower Trunk Rotation: 5 reps;10 seconds Piriformis Stretch: 2 reps;30 seconds;Limitations Piriformis Stretch Limitations: PROM by therapist in supine Supine Ab Set: 10 reps Clam: 5 reps Bridge: 10 reps Straight Leg Raise: 5 reps Other Supine Lumbar Exercises: Adductor ball squeeze 10x5"    Modalities Modalities: Moist Heat Moist Heat Therapy Moist Heat Location:  (lumbar with supine therex)  Physical Therapy Assessment and Plan PT Assessment and Plan Clinical Impression Statement: Continued focus on pain control/stretching.  Added Passive piriformis stretch, trunk stretch and stab exercises in supine.  Pt. required manual and VC's for form and stab.  MHP added lumbar with therex to help relax muscles and decrease pain.  Overall pain reduction to 3.5/10 at end of session.   Encouraged to do more stretching at home, continue with HEP. PT Plan: Continue with current PT POC.  Return to standing therex as pain allows.     Problem List Patient Active Problem List  Diagnosis  . DIABETES MELLITUS  . HYPERTENSION  . OBSTRUCTIVE CHRONIC BRONCHITIS WITHOUT EXACERBAT  . BIPOLAR AFFECTIVE DISORDER, HX OF  . RUQ pain  . Constipation  . Chronic pain  . Tobacco abuse  . Hepatic cyst  . Polycythemia  . Cecal  ulcer  . GI bleed  . Weakness of left leg  . Difficulty in walking    PT - End of Session Activity Tolerance: Patient tolerated treatment well General Behavior During Session: [facility] for tasks performed Cognition: [facility] for tasks performed   Lurena Nida, PTA/CLT 07/09/2012, 1:49 PM

## 2012-07-11 ENCOUNTER — Ambulatory Visit (HOSPITAL_COMMUNITY): Payer: 59 | Admitting: Physical Therapy

## 2012-07-13 ENCOUNTER — Inpatient Hospital Stay (HOSPITAL_COMMUNITY): Admission: RE | Admit: 2012-07-13 | Payer: 59 | Source: Ambulatory Visit | Admitting: Physical Therapy

## 2012-09-17 ENCOUNTER — Other Ambulatory Visit (HOSPITAL_COMMUNITY): Payer: Self-pay | Admitting: Neurological Surgery

## 2012-09-17 DIAGNOSIS — M5137 Other intervertebral disc degeneration, lumbosacral region: Secondary | ICD-10-CM

## 2012-09-17 DIAGNOSIS — K469 Unspecified abdominal hernia without obstruction or gangrene: Secondary | ICD-10-CM

## 2012-09-20 ENCOUNTER — Ambulatory Visit (HOSPITAL_COMMUNITY)
Admission: RE | Admit: 2012-09-20 | Discharge: 2012-09-20 | Disposition: A | Discharge status: Home / Self Care | Payer: 59 | Source: Ambulatory Visit | Attending: Neurological Surgery | Admitting: Neurological Surgery

## 2012-09-20 DIAGNOSIS — K469 Unspecified abdominal hernia without obstruction or gangrene: Secondary | ICD-10-CM

## 2012-09-20 DIAGNOSIS — K458 Other specified abdominal hernia without obstruction or gangrene: Secondary | ICD-10-CM | POA: Insufficient documentation

## 2012-09-20 DIAGNOSIS — R1031 Right lower quadrant pain: Secondary | ICD-10-CM | POA: Insufficient documentation

## 2012-09-20 DIAGNOSIS — M545 Low back pain, unspecified: Secondary | ICD-10-CM | POA: Insufficient documentation

## 2012-09-20 MED ORDER — IOHEXOL 300 MG/ML  SOLN
100.0000 mL | Freq: Once | INTRAMUSCULAR | Status: AC | PRN
  Administered 2012-09-20: 100 mL via INTRAVENOUS

## 2012-10-10 ENCOUNTER — Ambulatory Visit (INDEPENDENT_AMBULATORY_CARE_PROVIDER_SITE_OTHER): Payer: Self-pay | Admitting: General Surgery

## 2012-10-19 ENCOUNTER — Encounter (INDEPENDENT_AMBULATORY_CARE_PROVIDER_SITE_OTHER): Payer: Self-pay | Admitting: General Surgery

## 2012-10-19 ENCOUNTER — Ambulatory Visit (INDEPENDENT_AMBULATORY_CARE_PROVIDER_SITE_OTHER): Payer: Commercial Managed Care - PPO | Admitting: General Surgery

## 2012-10-19 VITALS — BP 136/74 | HR 76 | Temp 99.0°F | Resp 18 | Ht 65.0 in | Wt 247.0 lb

## 2012-10-19 DIAGNOSIS — K432 Incisional hernia without obstruction or gangrene: Secondary | ICD-10-CM

## 2012-10-28 NOTE — Progress Notes (Signed)
Patient ID: Brenda Frank, female   DOB: 07/27/1971, 42 y.o.   MRN: [id number]  Chief Complaint  Patient presents with  . Other    lumbar hernia    HPI Brenda Frank is a 42 y.o. female.  Referred by Dr Marikay Alar HPI 72 yof who on September 5 underwent lumbar fusion. She has since gained a fair amount of weight.  She is smoking.  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 She has pain in her back now as well as at her incision site associated with a bulge that is present. It is difficult to tell which is causing most of her pain right now. She is very emotional at this visit about her course and where to go from now. Activity makes this worse.  She is not having any changes in bms at all.  No n/v. This always goes back down. She has undergone ct scan that shows an incisional lumbar hernia. She states her bipolar disorder is under good control. She is present with her wife today who is nurse at [employer].  Past Medical History  Diagnosis Date  . Hypertension   . Diverticulosis   . 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 Polycythemia 10/25/2011    Query secondary to smoking or hypoxia. Hemoglobin 16-17 g  . Cecal ulcer 10/26/2011    Per colonoscopy  . Diverticulosis 10/26/2011    Per colonoscopy  . GERD (gastroesophageal reflux disease)   . Hernia, inguinal     rt  . Disorder of vocal cords     done  when intubated for hysterectomy  2008  . Chronic bronchitis 05/03/2012    "gets it 1-2X q yr"  . COPD with emphysema   . Pneumonia 2007  . Exertional dyspnea     "because of the COPD"  . Diabetes mellitus     no rx for 3-4 yrs  lost weight; "diet controlled" (05/03/2012)  . History of duodenal ulcer   . Chronic lower back pain   . Bipolar affective     "under control last 12 years" (05/03/2012)  . Skin cancer of face     "had it taken off"  . Asthma   . Emphysema of lung   . Cough   . Wheezing     Past Surgical History    Procedure Laterality Date  . Colonoscopy  10/25/2011    Procedure: COLONOSCOPY;  Surgeon: Malissa Hippo, MD;  Location: [facility];  Service: Endoscopy;  Laterality: N/A;  . Lateral / posterior combined fusion lumbar spine  05/03/2012  . Bilateral oophorectomy  2012  . Appendectomy  2000's  . Skin cancer excision      facial  . Anterior lat lumbar fusion  05/03/2012    Procedure: ANTERIOR LATERAL LUMBAR FUSION 3 LEVELS;  Surgeon: Tia Alert, MD;  Location: [facility];  Service: Neurosurgery;  Laterality: Left;  Left lumbar two-three, lumbar three-four, lumbar four-five anteriolateral retroperitoneal interbody fusion with pedicle screws  . Abdominal hysterectomy  10/2006  . Hernia repair  03/2008    "2 abdominal hernias repaired"  . Cholecystectomy  05/2007    Family History  Problem Relation Age of Onset  . Colon cancer Neg Hx   . Cancer Mother     bone    Social History History  Substance Use Topics  . Smoking status: Current Every Day Smoker -- 0.50 packs/day for 29  years    Types: Cigarettes  . Smokeless tobacco: Never Used     Comment: 05/03/2012 Patient is currently using the Blue cigarettes to help stop smoking  . Alcohol Use: No    Allergies  Allergen Reactions  . Haldol (Haloperidol Decanoate) Other (See Comments)    Dystonic reaction, muscle cramping  . Ultram (Tramadol Hcl) Other (See Comments)    Hallucinations  . Lyrica (Pregabalin) Other (See Comments)    "if I take more than I/day I start to feel jittery"    Current Outpatient Prescriptions  Medication Sig Dispense Refill  . albuterol (PROVENTIL HFA;VENTOLIN HFA) 108 (90 BASE) MCG/ACT inhaler Inhale 2 puffs into the lungs every 6 (six) hours as needed. For RESCUE.      Marland Kitchen albuterol (PROVENTIL) (2.5 MG/3ML) 0.083% nebulizer solution Take 2.5 mg by nebulization every 6 (six) hours as needed. For shortness of breath      . clonazePAM (KLONOPIN) 2 MG tablet Take 2 mg by mouth 2 (two) times daily as needed.  For anxiety      . dicyclomine (BENTYL) 10 MG capsule Take 10 mg by mouth 3 (three) times daily as needed. For IBS      . hydrochlorothiazide (HYDRODIURIL) 25 MG tablet Take 25 mg by mouth as needed.      Marland Kitchen HYDROcodone-acetaminophen (NORCO) 10-325 MG per tablet Take 1 tablet by mouth every 6 (six) hours as needed for pain.      Marland Kitchen HYDROmorphone (DILAUDID) 2 MG tablet Take 2 mg by mouth every 4 (four) hours as needed for pain.      Marland Kitchen lithium carbonate 300 MG capsule Take 600-900 mg by mouth 2 (two) times daily. 2 capsules in the morning and 3 capsules at night      . senna (SENOKOT) 8.6 MG tablet Take 1 tablet by mouth daily.      Marland Kitchen telmisartan (MICARDIS) 80 MG tablet Take 80 mg by mouth daily.      Marland Kitchen tiotropium (SPIRIVA) 18 MCG inhalation capsule Place 18 mcg into inhaler and inhale daily.       No current facility-administered medications for this visit.    Review of Systems Review of Systems  Constitutional: Negative for fever, chills and unexpected weight change.  HENT: Negative for hearing loss, congestion, sore throat, trouble swallowing and voice change.   Eyes: Negative for visual disturbance.  Respiratory: Positive for cough. Negative for wheezing.   Cardiovascular: Negative for chest pain, palpitations and leg swelling.  Gastrointestinal: Negative for nausea, vomiting, abdominal pain, diarrhea, constipation, blood in stool, abdominal distention and anal bleeding.  Genitourinary: Negative for hematuria, vaginal bleeding and difficulty urinating.  Musculoskeletal: Negative for arthralgias.  Skin: Negative for rash and wound.  Neurological: Positive for weakness. Negative for seizures, syncope and headaches.  Hematological: Negative for adenopathy. Does not bruise/bleed easily.  Psychiatric/Behavioral: Negative for confusion.    Blood pressure 136/74, pulse 76, temperature 99 F (37.2 C), temperature source Temporal, resp. rate 18, height 5\' 5"  (1.651 m), weight 247 lb (112.038  kg).  Physical Exam Physical Exam  Vitals reviewed. Constitutional: She appears well-developed and well-nourished.  Neck: Neck supple.  Cardiovascular: Normal rate, regular rhythm and normal heart sounds.   Pulmonary/Chest: Effort normal and breath sounds normal. She has no wheezes. She has no rales.    Abdominal: Soft. Bowel sounds are normal. There is no tenderness. A hernia is present.  Lymphadenopathy:    She has no cervical adenopathy.    Data Reviewed Ct reviewed by  me and I went over with patient  Assessment    Lumbar incisional hernia    Plan    It is hard to tell if this is symptomatic or causing all of her symptoms.  I think it is multifactorial.  We discussed repair either open vs laparoscopic.  I told her I think laparoscopic repair will be most durable but this is still a significant procedure.  I told her weight loss would help prevent recurrence.  I also told her she would need to stop smoking. I went over procedure as well as hospital stay and recovery. We discussed that there is recurrence rate but this could be decreased by smoking cessation and weight loss.  This is difficult now due to femoral nerve issue.  I will schedule her for a couple of months but will see back in one.  I told her day of surgery if she has been smoking I will not do procedure.  Risks include, but are not limited to, bleeding, infection, recurrence, dvt, pe, pna etc.          WAKEFIELD,MATTHEW 10/28/2012, 9:07 PM

## 2012-11-16 ENCOUNTER — Ambulatory Visit (INDEPENDENT_AMBULATORY_CARE_PROVIDER_SITE_OTHER): Payer: Commercial Managed Care - PPO | Admitting: General Surgery

## 2012-11-29 ENCOUNTER — Encounter (INDEPENDENT_AMBULATORY_CARE_PROVIDER_SITE_OTHER): Payer: Commercial Managed Care - PPO | Admitting: General Surgery

## 2012-12-04 ENCOUNTER — Encounter (INDEPENDENT_AMBULATORY_CARE_PROVIDER_SITE_OTHER): Payer: Self-pay | Admitting: General Surgery

## 2013-01-07 ENCOUNTER — Encounter (INDEPENDENT_AMBULATORY_CARE_PROVIDER_SITE_OTHER): Payer: Commercial Managed Care - PPO | Admitting: General Surgery

## 2013-01-28 ENCOUNTER — Encounter (INDEPENDENT_AMBULATORY_CARE_PROVIDER_SITE_OTHER): Payer: Commercial Managed Care - PPO | Admitting: General Surgery

## 2013-02-06 ENCOUNTER — Emergency Department (HOSPITAL_COMMUNITY)
Admission: EM | Admit: 2013-02-06 | Discharge: 2013-02-06 | Disposition: A | Discharge status: Home / Self Care | Payer: 59 | Attending: Emergency Medicine | Admitting: Emergency Medicine

## 2013-02-06 ENCOUNTER — Encounter (HOSPITAL_COMMUNITY): Payer: Self-pay | Admitting: Emergency Medicine

## 2013-02-06 ENCOUNTER — Emergency Department (HOSPITAL_COMMUNITY): Payer: 59

## 2013-02-06 DIAGNOSIS — J208 Acute bronchitis due to other specified organisms: Secondary | ICD-10-CM

## 2013-02-06 DIAGNOSIS — I10 Essential (primary) hypertension: Secondary | ICD-10-CM | POA: Insufficient documentation

## 2013-02-06 DIAGNOSIS — F17200 Nicotine dependence, unspecified, uncomplicated: Secondary | ICD-10-CM | POA: Insufficient documentation

## 2013-02-06 DIAGNOSIS — R5381 Other malaise: Secondary | ICD-10-CM | POA: Insufficient documentation

## 2013-02-06 DIAGNOSIS — Z79899 Other long term (current) drug therapy: Secondary | ICD-10-CM | POA: Insufficient documentation

## 2013-02-06 DIAGNOSIS — Z8701 Personal history of pneumonia (recurrent): Secondary | ICD-10-CM | POA: Insufficient documentation

## 2013-02-06 DIAGNOSIS — J209 Acute bronchitis, unspecified: Secondary | ICD-10-CM | POA: Insufficient documentation

## 2013-02-06 DIAGNOSIS — R21 Rash and other nonspecific skin eruption: Secondary | ICD-10-CM | POA: Insufficient documentation

## 2013-02-06 DIAGNOSIS — Z8719 Personal history of other diseases of the digestive system: Secondary | ICD-10-CM | POA: Insufficient documentation

## 2013-02-06 DIAGNOSIS — Z8711 Personal history of peptic ulcer disease: Secondary | ICD-10-CM | POA: Insufficient documentation

## 2013-02-06 DIAGNOSIS — R220 Localized swelling, mass and lump, head: Secondary | ICD-10-CM | POA: Insufficient documentation

## 2013-02-06 DIAGNOSIS — R059 Cough, unspecified: Secondary | ICD-10-CM | POA: Insufficient documentation

## 2013-02-06 DIAGNOSIS — R05 Cough: Secondary | ICD-10-CM | POA: Insufficient documentation

## 2013-02-06 DIAGNOSIS — Z8589 Personal history of malignant neoplasm of other organs and systems: Secondary | ICD-10-CM | POA: Insufficient documentation

## 2013-02-06 DIAGNOSIS — J45909 Unspecified asthma, uncomplicated: Secondary | ICD-10-CM | POA: Insufficient documentation

## 2013-02-06 DIAGNOSIS — R5383 Other fatigue: Secondary | ICD-10-CM | POA: Insufficient documentation

## 2013-02-06 DIAGNOSIS — E119 Type 2 diabetes mellitus without complications: Secondary | ICD-10-CM | POA: Insufficient documentation

## 2013-02-06 DIAGNOSIS — Z85828 Personal history of other malignant neoplasm of skin: Secondary | ICD-10-CM | POA: Insufficient documentation

## 2013-02-06 DIAGNOSIS — F319 Bipolar disorder, unspecified: Secondary | ICD-10-CM | POA: Insufficient documentation

## 2013-02-06 LAB — CBC WITH DIFFERENTIAL/PLATELET
Basophils Absolute: 0 10*3/uL (ref 0.0–0.1)
Basophils Relative: 0 % (ref 0–1)
Eosinophils Absolute: 0.2 10*3/uL (ref 0.0–0.7)
Eosinophils Relative: 1 % (ref 0–5)
Lymphocytes Relative: 33 % (ref 12–46)
MCV: 93.8 fL (ref 78.0–100.0)
Platelets: 280 10*3/uL (ref 150–400)
RDW: 14.6 % (ref 11.5–15.5)
WBC: 12.5 10*3/uL — ABNORMAL HIGH (ref 4.0–10.5)

## 2013-02-06 LAB — COMPREHENSIVE METABOLIC PANEL
ALT: 12 U/L (ref 0–35)
AST: 11 U/L (ref 0–37)
Albumin: 3.2 g/dL — ABNORMAL LOW (ref 3.5–5.2)
CO2: 35 mEq/L — ABNORMAL HIGH (ref 19–32)
Calcium: 9.3 mg/dL (ref 8.4–10.5)
Sodium: 138 mEq/L (ref 135–145)
Total Protein: 6.4 g/dL (ref 6.0–8.3)

## 2013-02-06 LAB — URINALYSIS, ROUTINE W REFLEX MICROSCOPIC
Bilirubin Urine: NEGATIVE
Glucose, UA: NEGATIVE mg/dL
Hgb urine dipstick: NEGATIVE
Specific Gravity, Urine: 1.02 (ref 1.005–1.030)
pH: 6 (ref 5.0–8.0)

## 2013-02-06 NOTE — ED Notes (Addendum)
Pt c/o right hand swelling/right side face/neck swelling/redness with dizziness and fatigue upon waking this am. Pt states she has been out of her Benicar HCT x 3 days.

## 2013-02-06 NOTE — ED Provider Notes (Signed)
History  This chart was scribed for Brenda Lennert, MD by Bennett Scrape, ED Scribe. This patient was seen in room [facility] and the patient's care was started at 8:51 AM.  CSN: [id number]  Arrival date & time 02/06/13  0830   First MD Initiated Contact with Patient 02/06/13 [phone]      Chief Complaint  Patient presents with  . Dizziness  . Fatigue     Patient is a 42 y.o. female presenting with cough. The history is provided by the patient. No language interpreter was used.  Cough Cough characteristics:  Non-productive Severity:  Mild Onset quality:  Sudden Timing:  Intermittent Associated symptoms: rash   Associated symptoms: no chest pain, no eye discharge, no fever and no headaches     HPI Comments: Brenda Frank is a 42 y.o. female who presents to the Emergency Department complaining of gradual onset, gradually worsening, constant diffuse right hand swelling with associated right face swelling, diffuse neck swelling, mild red rash to the neck described as itchy, lightheadedness, fatigue and non-productive cough that started this morning. Pt recently got over bronchitis but pt denies similarities in the cough. Pt denies having prior episodes of similar symptoms. She denies having CP and fever as associated symptoms. She has a h/o HTN, GERD and COPD. Pt is a current everyday smoker but denies alcohol use.  PCP is Dr. [name].   Past Medical History  Diagnosis Date  . Hypertension   . Diverticulosis   . 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 Polycythemia 10/25/2011    Query secondary to smoking or hypoxia. Hemoglobin 16-17 g  . Cecal ulcer 10/26/2011    Per colonoscopy  . Diverticulosis 10/26/2011    Per colonoscopy  . GERD (gastroesophageal reflux disease)   . Hernia, inguinal     rt  . Disorder of vocal cords     done  when intubated for hysterectomy  2008  . Chronic bronchitis 05/03/2012    "gets it 1-2X q yr"  . COPD with emphysema   . Pneumonia 2007  .  Exertional dyspnea     "because of the COPD"  . Diabetes mellitus     no rx for 3-4 yrs  lost weight; "diet controlled" (05/03/2012)  . History of duodenal ulcer   . Chronic lower back pain   . Bipolar affective     "under control last 12 years" (05/03/2012)  . Skin cancer of face     "had it taken off"  . Asthma   . Emphysema of lung   . Cough   . Wheezing     Past Surgical History  Procedure Laterality Date  . Colonoscopy  10/25/2011    Procedure: COLONOSCOPY;  Surgeon: Malissa Hippo, MD;  Location: [facility];  Service: Endoscopy;  Laterality: N/A;  . Lateral / posterior combined fusion lumbar spine  05/03/2012  . Bilateral oophorectomy  2012  . Appendectomy  2000's  . Skin cancer excision      facial  . Anterior lat lumbar fusion  05/03/2012    Procedure: ANTERIOR LATERAL LUMBAR FUSION 3 LEVELS;  Surgeon: Tia Alert, MD;  Location: [facility];  Service: Neurosurgery;  Laterality: Left;  Left lumbar two-three, lumbar three-four, lumbar four-five anteriolateral retroperitoneal interbody fusion with pedicle screws  . Abdominal hysterectomy  10/2006  . Hernia repair  03/2008    "2 abdominal hernias repaired"  . Cholecystectomy  05/2007  . Back surgery  Family History  Problem Relation Age of Onset  . Colon cancer Neg Hx   . Cancer Mother     bone    History  Substance Use Topics  . Smoking status: Current Every Day Smoker -- 0.50 packs/day for 29 years    Types: Cigarettes  . Smokeless tobacco: Never Used     Comment: 05/03/2012 Patient is currently using the Blue cigarettes to help stop smoking  . Alcohol Use: No    No OB history provided.  Review of Systems  Constitutional: Positive for fatigue. Negative for fever and appetite change.  HENT: Positive for facial swelling. Negative for congestion, sinus pressure and ear discharge.   Eyes: Negative for discharge.  Respiratory: Negative for cough.   Cardiovascular: Negative for chest pain.  Gastrointestinal:  Negative for abdominal pain and diarrhea.  Genitourinary: Negative for frequency and hematuria.  Musculoskeletal: Negative for back pain.       Positive for right hand swelling, no pain  Skin: Positive for rash.  Neurological: Positive for dizziness. Negative for seizures and headaches.  Psychiatric/Behavioral: Negative for hallucinations.    Allergies  Haldol; Ultram; and Lyrica  Home Medications   Current Outpatient Rx  Name  Route  Sig  Dispense  Refill  . albuterol (PROVENTIL HFA;VENTOLIN HFA) 108 (90 BASE) MCG/ACT inhaler   Inhalation   Inhale 2 puffs into the lungs every 6 (six) hours as needed. For RESCUE.         Marland Kitchen albuterol (PROVENTIL) (2.5 MG/3ML) 0.083% nebulizer solution   Nebulization   Take 2.5 mg by nebulization every 6 (six) hours as needed. For shortness of breath         . clonazePAM (KLONOPIN) 2 MG tablet   Oral   Take 2 mg by mouth 2 (two) times daily as needed. For anxiety         . dicyclomine (BENTYL) 10 MG capsule   Oral   Take 10 mg by mouth 3 (three) times daily as needed. For IBS         . hydrochlorothiazide (HYDRODIURIL) 25 MG tablet   Oral   Take 25 mg by mouth as needed.         Marland Kitchen HYDROcodone-acetaminophen (NORCO) 10-325 MG per tablet   Oral   Take 1 tablet by mouth every 6 (six) hours as needed for pain.         Marland Kitchen HYDROmorphone (DILAUDID) 2 MG tablet   Oral   Take 2 mg by mouth every 4 (four) hours as needed for pain.         Marland Kitchen lithium carbonate 300 MG capsule   Oral   Take 600-900 mg by mouth 2 (two) times daily. 2 capsules in the morning and 3 capsules at night         . senna (SENOKOT) 8.6 MG tablet   Oral   Take 1 tablet by mouth daily.         Marland Kitchen telmisartan (MICARDIS) 80 MG tablet   Oral   Take 80 mg by mouth daily.         Marland Kitchen tiotropium (SPIRIVA) 18 MCG inhalation capsule   Inhalation   Place 18 mcg into inhaler and inhale daily.           Triage Vitals: BP 123/86  Pulse 87  Temp(Src) 98 F  (36.7 C)  Resp 18  Ht 5\' 6"  (1.676 m)  Wt 241 lb (109.317 kg)  BMI 38.92 kg/m2  SpO2 93%  Physical  Exam  Nursing note and vitals reviewed. Constitutional: She is oriented to person, place, and time. She appears well-developed and well-nourished.  HENT:  Head: Normocephalic and atraumatic.  Eyes: Conjunctivae and EOM are normal. No scleral icterus.  Neck: Neck supple. No thyromegaly present.  Minimal swelling to her neck diffusely   Cardiovascular: Normal rate and regular rhythm.  Exam reveals no gallop and no friction rub.   No murmur heard. Pulmonary/Chest: Effort normal and breath sounds normal. No stridor. She has no wheezes. She has no rales. She exhibits no tenderness.  Abdominal: She exhibits no distension. There is no tenderness. There is no rebound.  Musculoskeletal: Normal range of motion.  Minimal right hand swelling  Lymphadenopathy:    She has no cervical adenopathy.  Neurological: She is alert and oriented to person, place, and time. Coordination normal.  Skin: Skin is warm and dry. No rash noted. No erythema.  Psychiatric: She has a normal mood and affect. Her behavior is normal.    ED Course  Procedures (including critical care time)  DIAGNOSTIC STUDIES: Oxygen Saturation is 93% on room air, adequate by my interpretation.    COORDINATION OF CARE: 8:55 AM-Discussed treatment plan which includes CXR, CBC panel, CMP and UA with pt at bedside and pt agreed to plan.   Labs Reviewed  CBC WITH DIFFERENTIAL - Abnormal; Notable for the following:    WBC 12.5 (*)    RBC 5.66 (*)    Hemoglobin 16.8 (*)    HCT 53.1 (*)    Lymphs Abs 4.1 (*)    All other components within normal limits  COMPREHENSIVE METABOLIC PANEL - Abnormal; Notable for the following:    CO2 35 (*)    Albumin 3.2 (*)    Total Bilirubin 0.2 (*)    All other components within normal limits  URINALYSIS, ROUTINE W REFLEX MICROSCOPIC - Abnormal; Notable for the following:    Ketones, ur TRACE (*)     All other components within normal limits   Dg Chest Port 1 View  02/06/2013   *RADIOLOGY REPORT*  Clinical Data: Weakness.  Dizziness.  Fatigue.  PORTABLE CHEST - 1 VIEW  Comparison: 09/13/2011  Findings: The patient is rotated to the right on today's exam, resulting in reduced diagnostic sensitivity and specificity. Heart size within normal limits for projection.  The lungs appear clear.  No pleural effusion observed.  IMPRESSION:  1.  No significant abnormality observed.   Original Report Authenticated By: Gaylyn Rong, M.D.     No diagnosis found.   Date: 02/06/2013  Rate:86  Rhythm: normal sinus rhythm  QRS Axis: normal  Intervals: normal  ST/T Wave abnormalities: nonspecific ST changes  Conduction Disutrbances:none  Narrative Interpretation:   Old EKG Reviewed: unchanged    MDM     The chart was scribed for me under my direct supervision.  I personally performed the history, physical, and medical decision making and all procedures in the evaluation of this patient.Brenda Lennert, MD 02/06/13 [phone]

## 2013-02-08 ENCOUNTER — Encounter (INDEPENDENT_AMBULATORY_CARE_PROVIDER_SITE_OTHER): Payer: Self-pay | Admitting: General Surgery

## 2013-02-08 ENCOUNTER — Ambulatory Visit (INDEPENDENT_AMBULATORY_CARE_PROVIDER_SITE_OTHER): Payer: Commercial Managed Care - PPO | Admitting: General Surgery

## 2013-02-08 VITALS — BP 118/78 | HR 80 | Temp 97.8°F | Resp 16 | Ht 66.0 in | Wt 247.8 lb

## 2013-02-08 DIAGNOSIS — K432 Incisional hernia without obstruction or gangrene: Secondary | ICD-10-CM

## 2013-02-08 NOTE — Progress Notes (Signed)
Subjective:     Patient ID: Brenda Frank, female   DOB: 10/01/1970, 42 y.o.   MRN: [id number]  HPI 20 yof who on September 5 underwent lumbar fusion. She has since gained a fair amount of weight. . 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 She has pain in her back now as well as at her incision site associated with a bulge that is present. It is difficult to tell which is causing most of her pain right now.  I saw her over a month ago and we discussed repair. She has not been smoking for a month now although her weight is up.  She reports no other complaints that are new.  She apparently has been to er this week and diagnosed with viral bronchitis but she has no real pulmonary symptoms.     Review of Systems     Objective:   Physical Exam    reducible flank hernia, nontender Assessment:     Incisional hernia     Plan:     We discussed repair.  I think after reviewing this an open repair hopefully preperitoneal with mesh would be best going through her flank.  I would like her to be on all her home meds esp her bp meds and at least starting to lose weight.  She states she cannot really exercise and lose weight due to femoral nerve paresis.  She has stopped smoking at least.  Both of [place] agreed to schedule her for august and hopefully she will have lost some weight by then and will need to still not smoke.  I will see her back prior to planned surgery to ensure we can proceed.

## 2013-03-12 ENCOUNTER — Ambulatory Visit (INDEPENDENT_AMBULATORY_CARE_PROVIDER_SITE_OTHER): Payer: Commercial Managed Care - PPO | Admitting: General Surgery

## 2013-03-12 ENCOUNTER — Encounter (INDEPENDENT_AMBULATORY_CARE_PROVIDER_SITE_OTHER): Payer: Self-pay | Admitting: General Surgery

## 2013-03-12 VITALS — BP 118/82 | HR 76 | Resp 14 | Ht 65.5 in | Wt 236.6 lb

## 2013-03-12 DIAGNOSIS — K432 Incisional hernia without obstruction or gangrene: Secondary | ICD-10-CM

## 2013-03-14 NOTE — Progress Notes (Signed)
Patient ID: Brenda Frank, female   DOB: 06/29/1971, 42 y.o.   MRN: [id number]  Chief Complaint  Patient presents with  . Routine Post Op    discuss hernia sx    HPI Brenda Frank is a 42 y.o. female.   HPI 42 yof who on September 5 underwent lumbar fusion. She has lost some weight. She is not smoking. 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 She has pain in her back now as well as at her incision site associated with a bulge that is present. It is difficult to tell which is causing most of her pain right now.  Activity makes this worse. She is not having any changes in bms at all. No n/v. This always goes back down. She has undergone ct scan that shows an incisional lumbar hernia.  She states her bipolar disorder is under good control still. Nothing has really changed since last visit except the smoking has stopped and her weight is going in right direction.  Her leg is doing much better and she is walking more  Past Medical History  Diagnosis Date  . Hypertension   . Diverticulosis   . 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 Polycythemia 10/25/2011    Query secondary to smoking or hypoxia. Hemoglobin 16-17 g  . Cecal ulcer 10/26/2011    Per colonoscopy  . Diverticulosis 10/26/2011    Per colonoscopy  . GERD (gastroesophageal reflux disease)   . Hernia, inguinal     rt  . Disorder of vocal cords     done  when intubated for hysterectomy  2008  . Chronic bronchitis 05/03/2012    "gets it 1-2X q yr"  . COPD with emphysema   . Pneumonia 2007  . Exertional dyspnea     "because of the COPD"  . Diabetes mellitus     no rx for 3-4 yrs  lost weight; "diet controlled" (05/03/2012)  . History of duodenal ulcer   . Chronic lower back pain   . Bipolar affective     "under control last 12 years" (05/03/2012)  . Skin cancer of face     "had it taken off"  . Asthma   . Emphysema of lung   . Cough   . Wheezing     Past Surgical History    Procedure Laterality Date  . Colonoscopy  10/25/2011    Procedure: COLONOSCOPY;  Surgeon: Najeeb U Rehman, MD;  Location: [facility];  Service: Endoscopy;  Laterality: N/A;  . Lateral / posterior combined fusion lumbar spine  05/03/2012  . Bilateral oophorectomy  2012  . Appendectomy  2000's  . Skin cancer excision      facial  . Anterior lat lumbar fusion  05/03/2012    Procedure: ANTERIOR LATERAL LUMBAR FUSION 3 LEVELS;  Surgeon: David S Jones, MD;  Location: [facility];  Service: Neurosurgery;  Laterality: Left;  Left lumbar two-three, lumbar three-four, lumbar four-five anteriolateral retroperitoneal interbody fusion with pedicle screws  . Abdominal hysterectomy  10/2006  . Hernia repair  03/2008    "2 abdominal hernias repaired"  . Cholecystectomy  05/2007  . Back surgery      Family History  Problem Relation Age of Onset  . Colon cancer Neg Hx   . Cancer Mother     bone    Social History History  Substance Use Topics  . Smoking status: Former Smoker -- 0.50 packs/day   for 29 years    Types: Cigarettes    Quit date: 01/25/2013  . Smokeless tobacco: Never Used     Comment: 05/03/2012 Patient is currently using the Blue cigarettes to help stop smoking  . Alcohol Use: No    Allergies  Allergen Reactions  . Haldol (Haloperidol Decanoate) Other (See Comments)    Dystonic reaction, muscle cramping  . Ultram (Tramadol Hcl) Other (See Comments)    Hallucinations  . Lyrica (Pregabalin) Other (See Comments)    "if I take more than I/day I start to feel jittery"    Current Outpatient Prescriptions  Medication Sig Dispense Refill  . Aclidinium Bromide (TUDORZA PRESSAIR) 400 MCG/ACT AEPB Inhale 1 puff into the lungs 2 (two) times daily.      . albuterol (PROVENTIL HFA;VENTOLIN HFA) 108 (90 BASE) MCG/ACT inhaler Inhale 2 puffs into the lungs every 6 (six) hours as needed. For RESCUE.      . clonazePAM (KLONOPIN) 2 MG tablet Take 2 mg by mouth 2 (two) times daily as needed. For  anxiety      . cyclobenzaprine (FLEXERIL) 10 MG tablet Take 10 mg by mouth 3 (three) times daily as needed for muscle spasms.      . HYDROcodone-acetaminophen (NORCO) 10-325 MG per tablet Take 1 tablet by mouth every 6 (six) hours as needed for pain.      . ipratropium-albuterol (DUONEB) 0.5-2.5 (3) MG/3ML SOLN Take 3 mLs by nebulization every 4 (four) hours as needed (shortness of breath).      . lithium carbonate 300 MG capsule Take 600-900 mg by mouth 2 (two) times daily. 2 capsules in the morning and 2 capsules at night      . olmesartan-hydrochlorothiazide (BENICAR HCT) 40-25 MG per tablet Take 1 tablet by mouth daily.      . oxymorphone (OPANA ER) 40 MG 12 hr tablet Take 40 mg by mouth every 12 (twelve) hours.       No current facility-administered medications for this visit.    Review of Systems Review of Systems  Constitutional: Negative for fever, chills and unexpected weight change.  HENT: Negative for hearing loss, congestion, sore throat, trouble swallowing and voice change.   Eyes: Negative for visual disturbance.  Respiratory: Negative for cough and wheezing.   Cardiovascular: Negative for chest pain, palpitations and leg swelling.  Gastrointestinal: Positive for abdominal pain. Negative for nausea, vomiting, diarrhea, constipation, blood in stool, abdominal distention and anal bleeding.  Genitourinary: Negative for hematuria, vaginal bleeding and difficulty urinating.  Musculoskeletal: Positive for back pain. Negative for arthralgias.  Skin: Negative for rash and wound.  Neurological: Negative for seizures, syncope and headaches.  Hematological: Negative for adenopathy. Does not bruise/bleed easily.  Psychiatric/Behavioral: Negative for confusion.    Blood pressure 118/82, pulse 76, resp. rate 14, height 5' 5.5" (1.664 m), weight 236 lb 9.6 oz (107.321 kg).  Physical Exam Physical Exam  Vitals reviewed. Constitutional: She appears well-developed and well-nourished.    Cardiovascular: Normal rate, regular rhythm and normal heart sounds.   Pulmonary/Chest: Effort normal and breath sounds normal. She has no wheezes. She has no rales.  Abdominal: Soft.    Lymphadenopathy:    She has no cervical adenopathy.    Assessment    Lumbar incisional hernia     Plan    It is hard to tell if this is symptomatic or causing all of her symptoms. I think it is multifactorial. I will plan on open hopefully preperitoneal repair with mesh.  There will   be drains posop. I told her weight loss would help prevent recurrence. I also told her she would need to stay off smoking. I went over procedure as well as hospital stay and recovery. We discussed that there is recurrence rate but this could be decreased by smoking cessation and weight loss. This is difficult now due to femoral nerve issue. I told her day of surgery if she has been smoking I will not do procedure. Risks include, but are not limited to, bleeding, infection, recurrence, dvt, pe, pna etc.         Pepper Kerrick 03/14/2013, 9:42 AM    

## 2013-03-25 ENCOUNTER — Encounter (INDEPENDENT_AMBULATORY_CARE_PROVIDER_SITE_OTHER): Payer: Commercial Managed Care - PPO | Admitting: General Surgery

## 2013-03-30 NOTE — Pre-Procedure Instructions (Addendum)
Brenda Frank  03/30/2013   Your procedure is scheduled on:  August 12  Report to [facility] at 05:30 AM.  Call this number if you have problems the morning of surgery: [phone]   Remember:   Do not eat food or drink liquids after midnight.   Take these medicines the morning of surgery with A SIP OF WATER: Klonopin, Tudorza, Hydrocodone (if needed), Duoneb (if needed), lithium, Opana                  STOP aspirin 04/04/13   Do not wear jewelry, make-up or nail polish.  Do not wear lotions, powders, or perfumes. You may wear deodorant.  Do not shave 48 hours prior to surgery. Men may shave face and neck.  Do not bring valuables to the hospital.  [facility] is not responsible for any belongings or valuables.  Contacts, dentures or bridgework may not be worn into surgery.  Leave suitcase in the car. After surgery it may be brought to your room.  For patients admitted to the hospital, checkout time is 11:00 AM the day of discharge.   Patients discharged the day of surgery will not be allowed to drive home.  Name and phone number of your driver: Family/ Friend  Special Instructions: Shower using CHG 2 nights before surgery and the night before surgery.  If you shower the day of surgery use CHG.  Use special wash - you have one bottle of CHG for all showers.  You should use approximately 1/3 of the bottle for each shower.   Please read over the following fact sheets that you were given: Pain Booklet, Coughing and Deep Breathing and Surgical Site Infection Prevention

## 2013-04-01 ENCOUNTER — Ambulatory Visit (HOSPITAL_COMMUNITY)
Admission: RE | Admit: 2013-04-01 | Discharge: 2013-04-01 | Disposition: A | Discharge status: Home / Self Care | Payer: 59 | Source: Ambulatory Visit | Attending: Anesthesiology | Admitting: Anesthesiology

## 2013-04-01 ENCOUNTER — Encounter (HOSPITAL_COMMUNITY): Payer: Self-pay | Admitting: Pharmacy Technician

## 2013-04-01 ENCOUNTER — Encounter (HOSPITAL_COMMUNITY): Payer: Self-pay

## 2013-04-01 ENCOUNTER — Encounter (HOSPITAL_COMMUNITY)
Admission: RE | Admit: 2013-04-01 | Discharge: 2013-04-01 | Disposition: A | Discharge status: Home / Self Care | Payer: 59 | Source: Ambulatory Visit | Attending: General Surgery | Admitting: General Surgery

## 2013-04-01 DIAGNOSIS — Z01818 Encounter for other preprocedural examination: Secondary | ICD-10-CM | POA: Insufficient documentation

## 2013-04-01 DIAGNOSIS — Z01812 Encounter for preprocedural laboratory examination: Secondary | ICD-10-CM | POA: Insufficient documentation

## 2013-04-01 HISTORY — DX: Heart failure, unspecified: I50.9

## 2013-04-01 LAB — URINALYSIS, ROUTINE W REFLEX MICROSCOPIC
Hgb urine dipstick: NEGATIVE
Leukocytes, UA: NEGATIVE
Protein, ur: NEGATIVE mg/dL
Specific Gravity, Urine: 1.009 (ref 1.005–1.030)
Urobilinogen, UA: 0.2 mg/dL (ref 0.0–1.0)

## 2013-04-01 LAB — COMPREHENSIVE METABOLIC PANEL
BUN: 12 mg/dL (ref 6–23)
CO2: 30 mEq/L (ref 19–32)
Calcium: 9.5 mg/dL (ref 8.4–10.5)
Chloride: 98 mEq/L (ref 96–112)
Creatinine, Ser: 0.6 mg/dL (ref 0.50–1.10)
GFR calc Af Amer: >90 mL/min (ref >90)
GFR calc non Af Amer: >90 mL/min (ref >90)
Glucose, Bld: 110 mg/dL — ABNORMAL HIGH (ref 70–99)
Total Bilirubin: 0.2 mg/dL — ABNORMAL LOW (ref 0.3–1.2)

## 2013-04-01 LAB — CBC WITH DIFFERENTIAL/PLATELET
Basophils Relative: 0 % (ref 0–1)
Eosinophils Absolute: 0.1 10*3/uL (ref 0.0–0.7)
Hemoglobin: 15.6 g/dL — ABNORMAL HIGH (ref 12.0–15.0)
Lymphs Abs: 3 10*3/uL (ref 0.7–4.0)
MCH: 29.7 pg (ref 26.0–34.0)
Monocytes Relative: 5 % (ref 3–12)
Neutro Abs: 5.7 10*3/uL (ref 1.7–7.7)
Neutrophils Relative %: 62 % (ref 43–77)
Platelets: 316 10*3/uL (ref 150–400)
RBC: 5.26 MIL/uL — ABNORMAL HIGH (ref 3.87–5.11)

## 2013-04-01 NOTE — Progress Notes (Signed)
Patient feels Brenda Frank is making it harder to breathe. To call dr this pm

## 2013-04-08 MED ORDER — CHLORHEXIDINE GLUCONATE 4 % EX LIQD: 1.0000 "application " | Freq: Once | CUTANEOUS | Status: DC

## 2013-04-08 MED ORDER — HEPARIN SODIUM (PORCINE) 5000 UNIT/ML IJ SOLN
5000.0000 [IU] | Freq: Once | INTRAMUSCULAR | Status: AC
  Administered 2013-04-09: 5000 [IU] via SUBCUTANEOUS
  Filled 2013-04-08: qty 1

## 2013-04-08 MED ORDER — DEXTROSE 5 % IV SOLN
3.0000 g | INTRAVENOUS | Status: AC
  Administered 2013-04-09: 3 g via INTRAVENOUS
  Filled 2013-04-08: qty 3000

## 2013-04-09 ENCOUNTER — Encounter (HOSPITAL_COMMUNITY)
Admission: RE | Disposition: A | Discharge status: Home / Self Care | Payer: Self-pay | Source: Ambulatory Visit | Attending: General Surgery

## 2013-04-09 ENCOUNTER — Ambulatory Visit (HOSPITAL_COMMUNITY): Payer: 59 | Admitting: Anesthesiology

## 2013-04-09 ENCOUNTER — Inpatient Hospital Stay (HOSPITAL_COMMUNITY)
Admission: RE | Admit: 2013-04-09 | Discharge: 2013-04-12 | DRG: 354 | Disposition: A | Discharge status: Home / Self Care | Payer: 59 | Source: Ambulatory Visit | Attending: General Surgery | Admitting: General Surgery

## 2013-04-09 ENCOUNTER — Encounter (HOSPITAL_COMMUNITY): Payer: Self-pay | Admitting: Anesthesiology

## 2013-04-09 DIAGNOSIS — M545 Low back pain, unspecified: Secondary | ICD-10-CM | POA: Diagnosis present

## 2013-04-09 DIAGNOSIS — E669 Obesity, unspecified: Secondary | ICD-10-CM | POA: Diagnosis present

## 2013-04-09 DIAGNOSIS — J4489 Other specified chronic obstructive pulmonary disease: Secondary | ICD-10-CM | POA: Diagnosis present

## 2013-04-09 DIAGNOSIS — Z981 Arthrodesis status: Secondary | ICD-10-CM

## 2013-04-09 DIAGNOSIS — F319 Bipolar disorder, unspecified: Secondary | ICD-10-CM | POA: Diagnosis present

## 2013-04-09 DIAGNOSIS — K432 Incisional hernia without obstruction or gangrene: Principal | ICD-10-CM | POA: Diagnosis present

## 2013-04-09 DIAGNOSIS — Z79899 Other long term (current) drug therapy: Secondary | ICD-10-CM

## 2013-04-09 DIAGNOSIS — Z85820 Personal history of malignant melanoma of skin: Secondary | ICD-10-CM

## 2013-04-09 DIAGNOSIS — G8929 Other chronic pain: Secondary | ICD-10-CM | POA: Diagnosis present

## 2013-04-09 DIAGNOSIS — E119 Type 2 diabetes mellitus without complications: Secondary | ICD-10-CM | POA: Diagnosis present

## 2013-04-09 DIAGNOSIS — K219 Gastro-esophageal reflux disease without esophagitis: Secondary | ICD-10-CM | POA: Diagnosis present

## 2013-04-09 DIAGNOSIS — Z808 Family history of malignant neoplasm of other organs or systems: Secondary | ICD-10-CM

## 2013-04-09 DIAGNOSIS — R339 Retention of urine, unspecified: Secondary | ICD-10-CM | POA: Diagnosis not present

## 2013-04-09 DIAGNOSIS — Z6841 Body Mass Index (BMI) 40.0 and over, adult: Secondary | ICD-10-CM

## 2013-04-09 DIAGNOSIS — I10 Essential (primary) hypertension: Secondary | ICD-10-CM | POA: Diagnosis present

## 2013-04-09 DIAGNOSIS — J449 Chronic obstructive pulmonary disease, unspecified: Secondary | ICD-10-CM | POA: Diagnosis present

## 2013-04-09 DIAGNOSIS — Z87891 Personal history of nicotine dependence: Secondary | ICD-10-CM

## 2013-04-09 HISTORY — PX: INSERTION OF MESH: SHX5868

## 2013-04-09 HISTORY — PX: INCISIONAL HERNIA REPAIR: SHX193

## 2013-04-09 LAB — CBC
HCT: 47.3 % — ABNORMAL HIGH (ref 36.0–46.0)
Hemoglobin: 15.9 g/dL — ABNORMAL HIGH (ref 12.0–15.0)
MCH: 30.3 pg (ref 26.0–34.0)
MCHC: 33.6 g/dL (ref 30.0–36.0)
MCV: 90.1 fL (ref 78.0–100.0)
RBC: 5.25 MIL/uL — ABNORMAL HIGH (ref 3.87–5.11)

## 2013-04-09 LAB — GLUCOSE, CAPILLARY
Glucose-Capillary: 119 mg/dL — ABNORMAL HIGH (ref 70–99)
Glucose-Capillary: 112 mg/dL — ABNORMAL HIGH (ref 70–99)

## 2013-04-09 LAB — CREATININE, SERUM: Creatinine, Ser: 0.73 mg/dL (ref 0.50–1.10)

## 2013-04-09 SURGERY — REPAIR, HERNIA, INCISIONAL
Anesthesia: General | Site: Flank | Laterality: Left | Wound class: Clean

## 2013-04-09 MED ORDER — ARTIFICIAL TEARS OP OINT
TOPICAL_OINTMENT | OPHTHALMIC | Status: DC | PRN
  Administered 2013-04-09: 1 via OPHTHALMIC

## 2013-04-09 MED ORDER — MORPHINE SULFATE (PF) 1 MG/ML IV SOLN
INTRAVENOUS | Status: AC
  Administered 2013-04-09: 15:00:00
  Filled 2013-04-09: qty 25

## 2013-04-09 MED ORDER — ROCURONIUM BROMIDE 100 MG/10ML IV SOLN
INTRAVENOUS | Status: DC | PRN
  Administered 2013-04-09: 50 mg via INTRAVENOUS
  Administered 2013-04-09: 20 mg via INTRAVENOUS

## 2013-04-09 MED ORDER — BACITRACIN ZINC 500 UNIT/GM EX OINT
TOPICAL_OINTMENT | CUTANEOUS | Status: AC
  Filled 2013-04-09: qty 15

## 2013-04-09 MED ORDER — ACETAMINOPHEN 650 MG RE SUPP: 650.0000 mg | Freq: Four times a day (QID) | RECTAL | Status: DC | PRN

## 2013-04-09 MED ORDER — HYDROCODONE-ACETAMINOPHEN 10-325 MG PO TABS: 1.0000 | ORAL_TABLET | Freq: Four times a day (QID) | ORAL | Status: DC | PRN

## 2013-04-09 MED ORDER — ONDANSETRON HCL 4 MG/2ML IJ SOLN: 4.0000 mg | Freq: Four times a day (QID) | INTRAMUSCULAR | Status: DC | PRN

## 2013-04-09 MED ORDER — BACITRACIN ZINC 500 UNIT/GM EX OINT
TOPICAL_OINTMENT | CUTANEOUS | Status: DC | PRN
  Administered 2013-04-09: 1 via TOPICAL

## 2013-04-09 MED ORDER — CYCLOBENZAPRINE HCL 10 MG PO TABS
10.0000 mg | ORAL_TABLET | Freq: Three times a day (TID) | ORAL | Status: DC | PRN
  Administered 2013-04-09 – 2013-04-11 (×7): 10 mg via ORAL
  Filled 2013-04-09 (×8): qty 1

## 2013-04-09 MED ORDER — LITHIUM CARBONATE ER 300 MG PO TBCR
600.0000 mg | EXTENDED_RELEASE_TABLET | Freq: Two times a day (BID) | ORAL | Status: DC
  Administered 2013-04-09 – 2013-04-12 (×7): 600 mg via ORAL
  Filled 2013-04-09 (×9): qty 2

## 2013-04-09 MED ORDER — GLYCOPYRROLATE 0.2 MG/ML IJ SOLN
INTRAMUSCULAR | Status: DC | PRN
  Administered 2013-04-09: .6 mg via INTRAVENOUS

## 2013-04-09 MED ORDER — SODIUM CHLORIDE 0.9 % IJ SOLN: 9.0000 mL | INTRAMUSCULAR | Status: DC | PRN

## 2013-04-09 MED ORDER — HYDROMORPHONE HCL PF 1 MG/ML IJ SOLN
INTRAMUSCULAR | Status: AC
  Filled 2013-04-09: qty 1

## 2013-04-09 MED ORDER — ALBUTEROL SULFATE HFA 108 (90 BASE) MCG/ACT IN AERS
2.0000 | INHALATION_SPRAY | Freq: Four times a day (QID) | RESPIRATORY_TRACT | Status: DC | PRN
  Filled 2013-04-09: qty 6.7

## 2013-04-09 MED ORDER — TIOTROPIUM BROMIDE MONOHYDRATE 18 MCG IN CAPS
18.0000 ug | ORAL_CAPSULE | Freq: Every day | RESPIRATORY_TRACT | Status: DC
  Administered 2013-04-10 – 2013-04-12 (×3): 18 ug via RESPIRATORY_TRACT
  Filled 2013-04-09: qty 5

## 2013-04-09 MED ORDER — DIPHENHYDRAMINE HCL 50 MG/ML IJ SOLN
12.5000 mg | Freq: Four times a day (QID) | INTRAMUSCULAR | Status: DC | PRN
  Administered 2013-04-10: 12.5 mg via INTRAVENOUS
  Filled 2013-04-09: qty 1

## 2013-04-09 MED ORDER — MORPHINE SULFATE 2 MG/ML IJ SOLN: 2.0000 mg | INTRAMUSCULAR | Status: DC | PRN

## 2013-04-09 MED ORDER — CEFAZOLIN SODIUM-DEXTROSE 2-3 GM-% IV SOLR
2.0000 g | Freq: Three times a day (TID) | INTRAVENOUS | Status: AC
  Administered 2013-04-09 – 2013-04-10 (×3): 2 g via INTRAVENOUS
  Filled 2013-04-09 (×3): qty 50

## 2013-04-09 MED ORDER — MORPHINE SULFATE ER 30 MG PO TBCR
30.0000 mg | EXTENDED_RELEASE_TABLET | Freq: Two times a day (BID) | ORAL | Status: DC
  Administered 2013-04-09 – 2013-04-12 (×7): 30 mg via ORAL
  Filled 2013-04-09 (×7): qty 1

## 2013-04-09 MED ORDER — ACLIDINIUM BROMIDE 400 MCG/ACT IN AEPB: 1.0000 | INHALATION_SPRAY | Freq: Two times a day (BID) | RESPIRATORY_TRACT | Status: DC

## 2013-04-09 MED ORDER — DOCUSATE SODIUM 100 MG PO CAPS
100.0000 mg | ORAL_CAPSULE | Freq: Every day | ORAL | Status: DC | PRN
  Administered 2013-04-10 – 2013-04-11 (×2): 100 mg via ORAL
  Filled 2013-04-09 (×2): qty 1

## 2013-04-09 MED ORDER — ONDANSETRON HCL 4 MG/2ML IJ SOLN
INTRAMUSCULAR | Status: DC | PRN
  Administered 2013-04-09: 4 mg via INTRAVENOUS

## 2013-04-09 MED ORDER — LACTATED RINGERS IV SOLN
INTRAVENOUS | Status: DC | PRN
  Administered 2013-04-09 (×2): via INTRAVENOUS

## 2013-04-09 MED ORDER — PROPOFOL 10 MG/ML IV BOLUS
INTRAVENOUS | Status: DC | PRN
  Administered 2013-04-09: 180 mg via INTRAVENOUS

## 2013-04-09 MED ORDER — SODIUM CHLORIDE 0.9 % IV SOLN
INTRAVENOUS | Status: DC
  Administered 2013-04-09 – 2013-04-10 (×2): via INTRAVENOUS

## 2013-04-09 MED ORDER — ACETAMINOPHEN 325 MG PO TABS: 650.0000 mg | ORAL_TABLET | Freq: Four times a day (QID) | ORAL | Status: DC | PRN

## 2013-04-09 MED ORDER — SENNOSIDES-DOCUSATE SODIUM 8.6-50 MG PO TABS
1.0000 | ORAL_TABLET | Freq: Every day | ORAL | Status: DC | PRN
  Administered 2013-04-10 – 2013-04-11 (×2): 1 via ORAL
  Filled 2013-04-09 (×2): qty 1

## 2013-04-09 MED ORDER — FENTANYL CITRATE 0.05 MG/ML IJ SOLN
INTRAMUSCULAR | Status: DC | PRN
  Administered 2013-04-09: 100 ug via INTRAVENOUS
  Administered 2013-04-09: 150 ug via INTRAVENOUS

## 2013-04-09 MED ORDER — PROMETHAZINE HCL 25 MG/ML IJ SOLN: 6.2500 mg | INTRAMUSCULAR | Status: DC | PRN

## 2013-04-09 MED ORDER — 0.9 % SODIUM CHLORIDE (POUR BTL) OPTIME
TOPICAL | Status: DC | PRN
  Administered 2013-04-09: 1000 mL

## 2013-04-09 MED ORDER — HEPARIN SODIUM (PORCINE) 5000 UNIT/ML IJ SOLN
5000.0000 [IU] | Freq: Three times a day (TID) | INTRAMUSCULAR | Status: DC
  Administered 2013-04-09 – 2013-04-12 (×8): 5000 [IU] via SUBCUTANEOUS
  Filled 2013-04-09 (×11): qty 1

## 2013-04-09 MED ORDER — MORPHINE SULFATE (PF) 1 MG/ML IV SOLN
INTRAVENOUS | Status: DC
  Administered 2013-04-09: 15 mg via INTRAVENOUS
  Administered 2013-04-09: 13 mg via INTRAVENOUS
  Administered 2013-04-09: 9.58 mg via INTRAVENOUS
  Administered 2013-04-09 (×2): via INTRAVENOUS
  Administered 2013-04-09: 14.63 mg via INTRAVENOUS
  Administered 2013-04-10 (×2): via INTRAVENOUS
  Administered 2013-04-10: 12 mg via INTRAVENOUS
  Administered 2013-04-10: 11 mg via INTRAVENOUS
  Administered 2013-04-10: 16.92 mg via INTRAVENOUS
  Administered 2013-04-10: 21:00:00 via INTRAVENOUS
  Administered 2013-04-10: 14.67 mg via INTRAVENOUS
  Administered 2013-04-10: 18 mg via INTRAVENOUS
  Administered 2013-04-10: 8 mg via INTRAVENOUS
  Administered 2013-04-11: 9 mg via INTRAVENOUS
  Administered 2013-04-11: 05:00:00 via INTRAVENOUS
  Filled 2013-04-09 (×6): qty 25

## 2013-04-09 MED ORDER — DIPHENHYDRAMINE HCL 12.5 MG/5ML PO ELIX: 12.5000 mg | ORAL_SOLUTION | Freq: Four times a day (QID) | ORAL | Status: DC | PRN

## 2013-04-09 MED ORDER — PANTOPRAZOLE SODIUM 40 MG PO TBEC: 40.0000 mg | DELAYED_RELEASE_TABLET | Freq: Every day | ORAL | Status: DC | PRN

## 2013-04-09 MED ORDER — LIDOCAINE HCL (CARDIAC) 20 MG/ML IV SOLN
INTRAVENOUS | Status: DC | PRN
  Administered 2013-04-09: 80 mg via INTRAVENOUS

## 2013-04-09 MED ORDER — CLONAZEPAM 1 MG PO TABS
2.0000 mg | ORAL_TABLET | Freq: Four times a day (QID) | ORAL | Status: DC | PRN
  Administered 2013-04-09 – 2013-04-12 (×3): 2 mg via ORAL
  Filled 2013-04-09 (×2): qty 1
  Filled 2013-04-09: qty 2
  Filled 2013-04-09: qty 1
  Filled 2013-04-09 (×2): qty 2

## 2013-04-09 MED ORDER — HYDROMORPHONE HCL PF 1 MG/ML IJ SOLN
0.2500 mg | INTRAMUSCULAR | Status: DC | PRN
  Administered 2013-04-09 (×4): 0.5 mg via INTRAVENOUS

## 2013-04-09 MED ORDER — MIDAZOLAM HCL 5 MG/5ML IJ SOLN
INTRAMUSCULAR | Status: DC | PRN
  Administered 2013-04-09: 2 mg via INTRAVENOUS

## 2013-04-09 MED ORDER — NEOSTIGMINE METHYLSULFATE 1 MG/ML IJ SOLN
INTRAMUSCULAR | Status: DC | PRN
  Administered 2013-04-09: 4 mg via INTRAVENOUS

## 2013-04-09 MED ORDER — NALOXONE HCL 0.4 MG/ML IJ SOLN: 0.4000 mg | INTRAMUSCULAR | Status: DC | PRN

## 2013-04-09 SURGICAL SUPPLY — 42 items
BLADE SURG ROTATE 9660 (MISCELLANEOUS) IMPLANT
CANISTER SUCTION 2500CC (MISCELLANEOUS) ×2 IMPLANT
CHLORAPREP W/TINT 26ML (MISCELLANEOUS) ×2 IMPLANT
CLOTH BEACON ORANGE TIMEOUT ST (SAFETY) ×2 IMPLANT
COVER SURGICAL LIGHT HANDLE (MISCELLANEOUS) ×2 IMPLANT
DRAIN CHANNEL 19F RND (DRAIN) ×2 IMPLANT
DRAPE LAPAROSCOPIC ABDOMINAL (DRAPES) ×2 IMPLANT
ELECT BLADE 4.0 EZ CLEAN MEGAD (MISCELLANEOUS) ×2
ELECT CAUTERY BLADE 6.4 (BLADE) ×2 IMPLANT
ELECT REM PT RETURN 9FT ADLT (ELECTROSURGICAL) ×2
ELECTRODE BLDE 4.0 EZ CLN MEGD (MISCELLANEOUS) ×1 IMPLANT
ELECTRODE REM PT RTRN 9FT ADLT (ELECTROSURGICAL) ×1 IMPLANT
EVACUATOR SILICONE 100CC (DRAIN) ×2 IMPLANT
GLOVE BIO SURGEON STRL SZ7 (GLOVE) ×2 IMPLANT
GLOVE BIOGEL PI IND STRL 7.0 (GLOVE) ×2 IMPLANT
GLOVE BIOGEL PI IND STRL 7.5 (GLOVE) ×1 IMPLANT
GLOVE BIOGEL PI INDICATOR 7.0 (GLOVE) ×2
GLOVE BIOGEL PI INDICATOR 7.5 (GLOVE) ×1
GLOVE SS BIOGEL STRL SZ 6.5 (GLOVE) ×1 IMPLANT
GLOVE SUPERSENSE BIOGEL SZ 6.5 (GLOVE) ×1
GLOVE SURG SS PI 7.0 STRL IVOR (GLOVE) ×2 IMPLANT
GOWN STRL NON-REIN LRG LVL3 (GOWN DISPOSABLE) ×6 IMPLANT
KIT BASIN OR (CUSTOM PROCEDURE TRAY) ×2 IMPLANT
KIT ROOM TURNOVER OR (KITS) ×2 IMPLANT
MESH VENTRALIGHT ST 6IN CRC (Mesh General) ×2 IMPLANT
NS IRRIG 1000ML POUR BTL (IV SOLUTION) ×2 IMPLANT
PACK GENERAL/GYN (CUSTOM PROCEDURE TRAY) ×2 IMPLANT
PAD ARMBOARD 7.5X6 YLW CONV (MISCELLANEOUS) ×2 IMPLANT
SPONGE GAUZE 4X4 12PLY (GAUZE/BANDAGES/DRESSINGS) ×4 IMPLANT
STAPLER VISISTAT 35W (STAPLE) IMPLANT
SUT ETHILON 2 0 FS 18 (SUTURE) ×2 IMPLANT
SUT NOV 1 T60/GS (SUTURE) IMPLANT
SUT NOVA NAB DX-16 0-1 5-0 T12 (SUTURE) ×4 IMPLANT
SUT NOVA NAB GS-21 0 18 T12 DT (SUTURE) IMPLANT
SUT PROLENE 0 CT 1 CR/8 (SUTURE) ×2 IMPLANT
SUT VIC AB 0 CT1 18XCR BRD 8 (SUTURE) ×1 IMPLANT
SUT VIC AB 0 CT1 8-18 (SUTURE) ×1
SUT VIC AB 2-0 SH 18 (SUTURE) ×2 IMPLANT
TAPE CLOTH SURG 4X10 WHT LF (GAUZE/BANDAGES/DRESSINGS) ×4 IMPLANT
TOWEL OR 17X24 6PK STRL BLUE (TOWEL DISPOSABLE) ×2 IMPLANT
TOWEL OR 17X26 10 PK STRL BLUE (TOWEL DISPOSABLE) ×2 IMPLANT
TRAY FOLEY CATH 14FRSI W/METER (CATHETERS) IMPLANT

## 2013-04-09 NOTE — Anesthesia Postprocedure Evaluation (Signed)
Anesthesia Post Note  Patient: Brenda Frank  Procedure(s) Performed: Procedure(s) (LRB): HERNIA REPAIR INCISIONAL (Left) INSERTION OF MESH (Left)  Anesthesia type: general  Patient location: PACU  Post pain: Pain level controlled  Post assessment: Patient's Cardiovascular Status Stable  Post vital signs: Reviewed and stable  Level of consciousness: sedated  Complications: No apparent anesthesia complications

## 2013-04-09 NOTE — Progress Notes (Signed)
Pt c/o increased pain in bladder area and inability to urinate. Day shift RN did I&O cath at 1730 with success. Bladder scan at 2145 revealed 525cc urine. I&O cath done per PRN order and 800cc was drained from the bladder. Patient tolerated well. Will continue to monitor and attempt voiding later.

## 2013-04-09 NOTE — H&P (View-Only) (Signed)
Patient ID: Brenda Frank, female   DOB: Nov 07, 1970, 42 y.o.   MRN: [id number]  Chief Complaint  Patient presents with  . Routine Post Op    discuss hernia sx    HPI Brenda Frank is a 42 y.o. female.   HPI 12 yof who on September 5 underwent lumbar fusion. She has lost some weight. She is not smoking. 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 She has pain in her back now as well as at her incision site associated with a bulge that is present. It is difficult to tell which is causing most of her pain right now.  Activity makes this worse. She is not having any changes in bms at all. No n/v. This always goes back down. She has undergone ct scan that shows an incisional lumbar hernia.  She states her bipolar disorder is under good control still. Nothing has really changed since last visit except the smoking has stopped and her weight is going in right direction.  Her leg is doing much better and she is walking more  Past Medical History  Diagnosis Date  . Hypertension   . Diverticulosis   . 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 Polycythemia 10/25/2011    Query secondary to smoking or hypoxia. Hemoglobin 16-17 g  . Cecal ulcer 10/26/2011    Per colonoscopy  . Diverticulosis 10/26/2011    Per colonoscopy  . GERD (gastroesophageal reflux disease)   . Hernia, inguinal     rt  . Disorder of vocal cords     done  when intubated for hysterectomy  2008  . Chronic bronchitis 05/03/2012    "gets it 1-2X q yr"  . COPD with emphysema   . Pneumonia 2007  . Exertional dyspnea     "because of the COPD"  . Diabetes mellitus     no rx for 3-4 yrs  lost weight; "diet controlled" (05/03/2012)  . History of duodenal ulcer   . Chronic lower back pain   . Bipolar affective     "under control last 12 years" (05/03/2012)  . Skin cancer of face     "had it taken off"  . Asthma   . Emphysema of lung   . Cough   . Wheezing     Past Surgical History    Procedure Laterality Date  . Colonoscopy  10/25/2011    Procedure: COLONOSCOPY;  Surgeon: Malissa Hippo, MD;  Location: [facility];  Service: Endoscopy;  Laterality: N/A;  . Lateral / posterior combined fusion lumbar spine  05/03/2012  . Bilateral oophorectomy  2012  . Appendectomy  2000's  . Skin cancer excision      facial  . Anterior lat lumbar fusion  05/03/2012    Procedure: ANTERIOR LATERAL LUMBAR FUSION 3 LEVELS;  Surgeon: Tia Alert, MD;  Location: [facility];  Service: Neurosurgery;  Laterality: Left;  Left lumbar two-three, lumbar three-four, lumbar four-five anteriolateral retroperitoneal interbody fusion with pedicle screws  . Abdominal hysterectomy  10/2006  . Hernia repair  03/2008    "2 abdominal hernias repaired"  . Cholecystectomy  05/2007  . Back surgery      Family History  Problem Relation Age of Onset  . Colon cancer Neg Hx   . Cancer Mother     bone    Social History History  Substance Use Topics  . Smoking status: Former Smoker -- 0.50 packs/day  for 29 years    Types: Cigarettes    Quit date: 01/25/2013  . Smokeless tobacco: Never Used     Comment: 05/03/2012 Patient is currently using the Blue cigarettes to help stop smoking  . Alcohol Use: No    Allergies  Allergen Reactions  . Haldol (Haloperidol Decanoate) Other (See Comments)    Dystonic reaction, muscle cramping  . Ultram (Tramadol Hcl) Other (See Comments)    Hallucinations  . Lyrica (Pregabalin) Other (See Comments)    "if I take more than I/day I start to feel jittery"    Current Outpatient Prescriptions  Medication Sig Dispense Refill  . Aclidinium Bromide (TUDORZA PRESSAIR) 400 MCG/ACT AEPB Inhale 1 puff into the lungs 2 (two) times daily.      Marland Kitchen albuterol (PROVENTIL HFA;VENTOLIN HFA) 108 (90 BASE) MCG/ACT inhaler Inhale 2 puffs into the lungs every 6 (six) hours as needed. For RESCUE.      . clonazePAM (KLONOPIN) 2 MG tablet Take 2 mg by mouth 2 (two) times daily as needed. For  anxiety      . cyclobenzaprine (FLEXERIL) 10 MG tablet Take 10 mg by mouth 3 (three) times daily as needed for muscle spasms.      Marland Kitchen HYDROcodone-acetaminophen (NORCO) 10-325 MG per tablet Take 1 tablet by mouth every 6 (six) hours as needed for pain.      Marland Kitchen ipratropium-albuterol (DUONEB) 0.5-2.5 (3) MG/3ML SOLN Take 3 mLs by nebulization every 4 (four) hours as needed (shortness of breath).      Marland Kitchen lithium carbonate 300 MG capsule Take 600-900 mg by mouth 2 (two) times daily. 2 capsules in the morning and 2 capsules at night      . olmesartan-hydrochlorothiazide (BENICAR HCT) 40-25 MG per tablet Take 1 tablet by mouth daily.      Marland Kitchen oxymorphone (OPANA ER) 40 MG 12 hr tablet Take 40 mg by mouth every 12 (twelve) hours.       No current facility-administered medications for this visit.    Review of Systems Review of Systems  Constitutional: Negative for fever, chills and unexpected weight change.  HENT: Negative for hearing loss, congestion, sore throat, trouble swallowing and voice change.   Eyes: Negative for visual disturbance.  Respiratory: Negative for cough and wheezing.   Cardiovascular: Negative for chest pain, palpitations and leg swelling.  Gastrointestinal: Positive for abdominal pain. Negative for nausea, vomiting, diarrhea, constipation, blood in stool, abdominal distention and anal bleeding.  Genitourinary: Negative for hematuria, vaginal bleeding and difficulty urinating.  Musculoskeletal: Positive for back pain. Negative for arthralgias.  Skin: Negative for rash and wound.  Neurological: Negative for seizures, syncope and headaches.  Hematological: Negative for adenopathy. Does not bruise/bleed easily.  Psychiatric/Behavioral: Negative for confusion.    Blood pressure 118/82, pulse 76, resp. rate 14, height 5' 5.5" (1.664 m), weight 236 lb 9.6 oz (107.321 kg).  Physical Exam Physical Exam  Vitals reviewed. Constitutional: She appears well-developed and well-nourished.    Cardiovascular: Normal rate, regular rhythm and normal heart sounds.   Pulmonary/Chest: Effort normal and breath sounds normal. She has no wheezes. She has no rales.  Abdominal: Soft.    Lymphadenopathy:    She has no cervical adenopathy.    Assessment    Lumbar incisional hernia     Plan    It is hard to tell if this is symptomatic or causing all of her symptoms. I think it is multifactorial. I will plan on open hopefully preperitoneal repair with mesh.  There will  be drains posop. I told her weight loss would help prevent recurrence. I also told her she would need to stay off smoking. I went over procedure as well as hospital stay and recovery. We discussed that there is recurrence rate but this could be decreased by smoking cessation and weight loss. This is difficult now due to femoral nerve issue. I told her day of surgery if she has been smoking I will not do procedure. Risks include, but are not limited to, bleeding, infection, recurrence, dvt, pe, pna etc.         Chelsee Hosie 03/14/2013, 9:42 AM

## 2013-04-09 NOTE — Interval H&P Note (Signed)
History and Physical Interval Note:  04/09/2013 7:23 AM  Brenda Frank  has presented today for surgery, with the diagnosis of incisional flank hernia  The various methods of treatment have been discussed with the patient and family. After consideration of risks, benefits and other options for treatment, the patient has consented to  Procedure(s): HERNIA REPAIR INCISIONAL (N/A) INSERTION OF MESH (N/A) as a surgical intervention .  The patient's history has been reviewed, patient examined, no change in status, stable for surgery.  I have reviewed the patient's chart and labs.  Questions were answered to the patient's satisfaction.     Lawan Nanez

## 2013-04-09 NOTE — Anesthesia Preprocedure Evaluation (Addendum)
Anesthesia Evaluation  Patient identified by MRN, date of birth, ID band Patient awake    Reviewed: Allergy & Precautions, H&P , NPO status , Patient's Chart, lab work & pertinent test results  History of Anesthesia Complications Negative for: history of anesthetic complications  Airway Mallampati: I TM Distance: >3 FB Neck ROM: Full    Dental  (+) Edentulous Upper, Edentulous Lower and Dental Advisory Given   Pulmonary shortness of breath and with exertion, asthma , pneumonia -, resolved, COPD COPD inhaler, former smoker,  Quit smoking May 2014   Pulmonary exam normal       Cardiovascular Exercise Tolerance: Poor hypertension, Pt. on medications Rhythm:Regular Rate:Normal  27-Jan-2013 08:43:30 [facility] ROUTINE RECORD Normal sinus rhythm Normal ECG When compared with ECG of 13-Sep-2011 22:54, No significant change w   Neuro/Psych PSYCHIATRIC DISORDERS Anxiety Depression Bipolar Disorder Chronic Pain Syndrome Left leg weakness  Neuromuscular disease    GI/Hepatic PUD, GERD-  Medicated and Controlled,(+)     substance abuse   , Narcotic Dependence   Endo/Other  diabetes, Well Controlled, Oral Hypoglycemic Agents  Renal/GU negative Renal ROS     Musculoskeletal  (+) Arthritis -, Osteoarthritis,    Abdominal Normal abdominal exam  (+)   Peds  Hematology   Anesthesia Other Findings   Reproductive/Obstetrics                      Anesthesia Physical Anesthesia Plan  ASA: III  Anesthesia Plan: General   Post-op Pain Management:    Induction:   Airway Management Planned: Oral ETT  Additional Equipment:   Intra-op Plan:   Post-operative Plan: Extubation in OR  Informed Consent: I have reviewed the patients History and Physical, chart, labs and discussed the procedure including the risks, benefits and alternatives for the proposed anesthesia with the patient or  authorized representative who has indicated his/her understanding and acceptance.   Dental advisory given  Plan Discussed with: CRNA, Anesthesiologist and Surgeon  Anesthesia Plan Comments:        Anesthesia Quick Evaluation

## 2013-04-09 NOTE — Anesthesia Procedure Notes (Signed)
Procedure Name: Intubation Date/Time: 04/09/2013 7:35 AM Performed by: Tyrone Nine Pre-anesthesia Checklist: Patient identified, Timeout performed, Emergency Drugs available, Suction available and Patient being monitored Patient Re-evaluated:Patient Re-evaluated prior to inductionOxygen Delivery Method: Circle system utilized Preoxygenation: Pre-oxygenation with 100% oxygen Intubation Type: IV induction Ventilation: Mask ventilation without difficulty Laryngoscope Size: Mac and 3 Grade View: Grade I Tube type: Oral Tube size: 7.5 mm Number of attempts: 1 Airway Equipment and Method: Stylet Placement Confirmation: ETT inserted through vocal cords under direct vision,  breath sounds checked- equal and bilateral and positive ETCO2 Secured at: 21 cm Tube secured with: Tape Dental Injury: Teeth and Oropharynx as per pre-operative assessment

## 2013-04-09 NOTE — Op Note (Signed)
Preoperative diagnosis: Lumbar incisional hernia Postoperative diagnosis: Same as above Procedure: Open lumbar incisional hernia repair with mesh Surgeon: Dr. Harden Mo Asst.: Dr. Axel Filler Anesthesia: Gen. Specimens: None Estimated blood loss: Minimal Drains: 19 [place] Blake drain Complications: None Sponge needle count correct x2 and the operation Disposition to recovery room in stable condition  Indications: This is a 42 year old female who underwent lumbar fusion. She postoperatively had a femoral nerve paresis as well as developed an incisional hernia in the lumbar position. This has become very symptomatic. She has stopped smoking and has begun to lose the weight. Her weight loss is somewhat limited due to her femoral nerve issues which are improving. The hernia has become more symptomatic so we discussed repair. We specifically discussed a rate of recurrence associated with this due to her body habitus.  Procedure: After informed consent was obtained the patient was taken to the operating room. She was administered 3 g of intravenous cefazolin. Sequential compression devices were on her legs. She was then placed under general anesthesia without complication. She was then rolled into the right lateral position and appropriately padded. She was then prepped and draped in the standard sterile surgical fashion. A surgical timeout was performed.  The bed was flexed and I was able to identify the hernia below her incision. I ended up making an elliptical incision to include her old incision. I carried this out down to the fascia. She was noted to have about a 2 x 3 cm defect between her musculature going into the retroperitoneum. I excised the hernia sac in its entirety. I then elected to place a 6 inch piece of ventralight mesh. I placed 4 sutures of 0 Prolene. The most lateral suture I placed several centimeters from the edge of the mesh. The position of this mesh was somewhat limited  by her lumbar vertebra. I think I will get enough overlap with this. I then placed the mesh. I used the Endo Close device to bring up the suture. I then tied all these down and laid the mesh flat and it was in good position. The mesh was in the retroperitoneal position there was no contact with any intraperitoneal contents. This completely covered the defect with good overlap. I then closed her native fascia in a couple of layers with #1 Novafil suture. I then irrigated. Hemostasis was obtained. I then placed a 71 [place] Blake drain in this space. I then closed her superficial tissues with 0 Vicryl suture. The skin was closed with staples. A sterile dressing was placed over this. She tolerated this well was extubated and transferred to the recovery room in stable condition.

## 2013-04-09 NOTE — Transfer of Care (Signed)
Immediate Anesthesia Transfer of Care Note  Patient: Brenda Frank  Procedure(s) Performed: Procedure(s): HERNIA REPAIR INCISIONAL (Left) INSERTION OF MESH (Left)  Patient Location: PACU  Anesthesia Type:General  Level of Consciousness: awake, alert , oriented and patient cooperative  Airway & Oxygen Therapy: Patient Spontanous Breathing and Patient connected to nasal cannula oxygen  Post-op Assessment: Report given to PACU RN and Post -op Vital signs reviewed and stable  Post vital signs: Reviewed and stable  Complications: No apparent anesthesia complications

## 2013-04-09 NOTE — Preoperative (Signed)
Beta Blockers   Reason not to administer Beta Blockers:Not Applicable 

## 2013-04-10 LAB — BASIC METABOLIC PANEL
CO2: 34 mEq/L — ABNORMAL HIGH (ref 19–32)
Calcium: 9 mg/dL (ref 8.4–10.5)
Chloride: 98 mEq/L (ref 96–112)
Glucose, Bld: 112 mg/dL — ABNORMAL HIGH (ref 70–99)
Potassium: 4.2 mEq/L (ref 3.5–5.1)
Sodium: 137 mEq/L (ref 135–145)

## 2013-04-10 LAB — CBC
Hemoglobin: 15.4 g/dL — ABNORMAL HIGH (ref 12.0–15.0)
MCV: 91.2 fL (ref 78.0–100.0)
Platelets: 306 10*3/uL (ref 150–400)
RBC: 5.12 MIL/uL — ABNORMAL HIGH (ref 3.87–5.11)
WBC: 10.1 10*3/uL (ref 4.0–10.5)

## 2013-04-10 NOTE — Progress Notes (Signed)
MD made aware of patient pain, afterwards discussed with the patient about what the MD suggest about her pain management, patient and partner agrees with the present pain management.

## 2013-04-10 NOTE — Evaluation (Signed)
Physical Therapy Evaluation Patient Details Name: Brenda Frank MRN: [id number] DOB: 14-Apr-1971 Today's Date: 04/10/2013 Time: 1347-1400 PT Time Calculation (min): 13 min  PT Assessment / Plan / Recommendation History of Present Illness  Patient is a 42 y/o female admitted with incisional hernia following ALIF L2-3, L3-4. L4-5.  Now s/p hernia repair.  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  Clinical Impression  Patient presents with decreased activity tolerance and pain following incisional hernia repair.  She will benefit from skilled PT in the acute setting to allow return home with family assist.  Likely no need for follow up PT.  Encouraged to walk twice daily in hallway with assist during acute stay.   PT Assessment  Patient needs continued PT services    Follow Up Recommendations  No PT follow up;Supervision/Assistance - 24 hour          Equipment Recommendations  None recommended by PT    Recommendations for Other Services   None  Frequency Min 5X/week    Precautions / Restrictions Precautions Precautions: Fall   Pertinent Vitals/Pain Pain in surgical wound moderate using PCA      Mobility  Bed Mobility Bed Mobility: Rolling Right;Right Sidelying to Sit;Sitting - Scoot to Edge of Bed;Sit to Sidelying Right Rolling Right: 5: Supervision Right Sidelying to Sit: 5: Supervision;With rails;HOB elevated Sitting - Scoot to Edge of Bed: 5: Supervision Sit to Sidelying Right: 5: Supervision;With rail;HOB flat Details for Bed Mobility Assistance: log roll technique without cues Transfers Transfers: Sit to Stand;Stand to Sit Sit to Stand: 4: Min assist;From bed Stand to Sit: 4: Min guard;To bed Details for Transfer Assistance: caregiver in room and assisted to stand (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[employer] [employer]) Ambulation/Gait Ambulation/Gait Assistance: 4: Min assist Ambulation Distance (Feet): 90  Feet Assistive device: None Ambulation/Gait Assistance Details: eyes partially closed and head turned in conversation without balance loss Gait Pattern: Step-through pattern;Shuffle;Trunk flexed        PT Diagnosis: Difficulty walking;Acute pain  PT Problem List: Decreased mobility;Pain;Decreased activity tolerance PT Treatment Interventions: DME instruction;Gait training;Stair training;Functional mobility training;Therapeutic exercise;Patient/family education     PT Goals(Current goals can be found in the care plan section) Acute Rehab PT Goals Patient Stated Goal: To return to normal PT Goal Formulation: With patient/family Time For Goal Achievement: 04/17/13 Potential to Achieve Goals: Good  Visit Information  Last PT Received On: 04/10/13 Assistance Needed: +1 History of Present Illness: Patient is a 42 y/o female admitted with incisional hernia following ALIF L2-3, L3-4. L4-5.  Now s/p hernia repair.  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       Prior Functioning  Home Living Family/patient expects to be discharged to:: Private residence Living Arrangements: Spouse/significant other Available Help at Discharge: Friend(s);Available 24 hours/day;Family;Available PRN/intermittently Type of Home: House Home Access: Stairs to enter [employer] of Steps: 3 Entrance Stairs-Rails: Left Home Layout: One level (step down to bedroom) Home Equipment: Walker - 2 wheels;Shower seat;Bedside commode Additional Comments: Spouse is ICU RN at [employer] Prior Function Level of Independence: Independent Communication Communication: No difficulties    Cognition  Cognition Arousal/Alertness: Awake/alert Behavior During Therapy: Agitated Overall Cognitive Status: Within Functional Limits for tasks assessed    Extremity/Trunk Assessment Lower Extremity Assessment Lower Extremity Assessment:  Overall WFL for tasks assessed (not formally tested but able to lift into bed  independent)   Balance    End of Session PT - End of Session Activity Tolerance: Patient tolerated treatment well Patient left: in bed;with family/visitor present;with call bell/phone within reach  GP     [facility] 04/10/2013, 2:10 PM [place], [place] [phone] 04/10/2013

## 2013-04-10 NOTE — Progress Notes (Signed)
Pt c/o increased pain in bladder and inability to urinate again. Bladder scan revealed 445cc. MD on call Dr. Luisa Hart made aware and orders given to place Foley catheter. Patient made aware and Foley placed.

## 2013-04-10 NOTE — Progress Notes (Signed)
1 Day Post-Op  Subjective: Back pain, foley in last night  Objective: Vital signs in last 24 hours: Temp:  [97.3 F (36.3 C)-100.2 F (37.9 C)] 97.3 F (36.3 C) (08/13 0609) Pulse Rate:  [61-123] 123 (08/13 0609) Resp:  [9-28] 9 (08/13 0756) BP: (93-120)/(49-78) 93/51 mmHg (08/13 0609) SpO2:  [92 %-100 %] 92 % (08/13 0756) Weight:  [245 lb 2.4 oz (111.2 kg)] 245 lb 2.4 oz (111.2 kg) (08/12 1803) Last BM Date: 04/08/13  Intake/Output from previous day: 08/12 0701 - 08/13 0700 In: 2528.5 [P.O.:240; I.V.:2188.5; IV Piggyback:100] Out: 2900 [Urine:2830; Drains:45; Blood:25] Intake/Output this shift:    General appearance: no distress GI: drain with serous fluid, dressing in place, soft, nontender abdomen  Lab Results:   Recent Labs  04/09/13 1130 04/10/13 0615  WBC 14.8* 10.1  HGB 15.9* 15.4*  HCT 47.3* 46.7*  PLT 338 306   BMET  Recent Labs  04/09/13 1130 04/10/13 0615  NA  --  137  K  --  4.2  CL  --  98  CO2  --  34*  GLUCOSE  --  112*  BUN  --  9  CREATININE 0.73 0.66  CALCIUM  --  9.0   PT/INR No results found for this basename: LABPROT, INR,  in the last 72 hours ABG No results found for this basename: PHART, PCO2, PO2, HCO3,  in the last 72 hours  Studies/Results: No results found.  Anti-infectives: Anti-infectives   Start     Dose/Rate Route Frequency Ordered Stop   04/09/13 1400  ceFAZolin (ANCEF) IVPB 2 g/50 mL premix     2 g 100 mL/hr over 30 Minutes Intravenous 3 times per day 04/09/13 1106 04/10/13 0629   04/09/13 0600  ceFAZolin (ANCEF) 3 g in dextrose 5 % 50 mL IVPB     3 g 160 mL/hr over 30 Minutes Intravenous On call to O.R. 04/08/13 1439 04/09/13 0736      Assessment/Plan: POD 1 incisional (lumbar) hernia repair with mesh 1. Neuro- cont pca today due to pain issues, much of this is preexisting and some related to her back 2. CV/pulm needs to be oob today, ics, will get pt involved with her 3. GI- regular diet 4. Renal- acute  urinary retention, will leave foley in place until tomorrow and then attempt to remove again. 5. scds subq heparin  [facility] 04/10/2013

## 2013-04-10 NOTE — Progress Notes (Signed)
Met with Brenda Frank at bedside as a benefit of Link to [employer] program for employees and their dependents with [employer] insurance. Asked if she would be interested in the Link to Wellness program for DM management. States her DM is diet and exercise controlled. Agreeable to post hospital follow up phone call. Confirmed best contact number and left Link to Wellness packet at bedside.   Raiford Noble, MSN- Ed, Charity fundraiser, BSN- [facility] [phone]

## 2013-04-11 ENCOUNTER — Encounter (HOSPITAL_COMMUNITY): Payer: Self-pay | Admitting: General Surgery

## 2013-04-11 MED ORDER — PANTOPRAZOLE SODIUM 40 MG PO TBEC
40.0000 mg | DELAYED_RELEASE_TABLET | Freq: Two times a day (BID) | ORAL | Status: DC
  Administered 2013-04-11 – 2013-04-12 (×3): 40 mg via ORAL
  Filled 2013-04-11 (×3): qty 1

## 2013-04-11 MED ORDER — HYDROCODONE-ACETAMINOPHEN 10-325 MG PO TABS
1.0000 | ORAL_TABLET | Freq: Four times a day (QID) | ORAL | Status: DC | PRN
  Administered 2013-04-11 – 2013-04-12 (×4): 2 via ORAL
  Filled 2013-04-11 (×5): qty 2

## 2013-04-11 MED ORDER — MORPHINE SULFATE 2 MG/ML IJ SOLN
2.0000 mg | INTRAMUSCULAR | Status: DC | PRN
  Administered 2013-04-11 – 2013-04-12 (×4): 2 mg via INTRAVENOUS
  Filled 2013-04-11 (×4): qty 1

## 2013-04-11 NOTE — Progress Notes (Addendum)
2 Days Post-Op  Subjective: Feels ok, ambulated some, up in chair, no n/v, some reflux  Objective: Vital signs in last 24 hours: Temp:  [98.1 F (36.7 C)-100.9 F (38.3 C)] 99.7 F (37.6 C) (08/14 0549) Pulse Rate:  [110-136] 110 (08/14 0549) Resp:  [12-17] 14 (08/14 0549) BP: (89-105)/(47-59) 102/49 mmHg (08/14 0549) SpO2:  [92 %-96 %] 94 % (08/14 0812) Last BM Date:  (PTA)  Intake/Output from previous day: 08/13 0701 - 08/14 0700 In: 400 [I.V.:400] Out: 2240 [Urine:2200; Drains:40] Intake/Output this shift:    General appearance: no distress Resp: decreased bilateral bases Cardio: regular rate and rhythm GI: soft nontender, incision clean, jp with expected serous output  Lab Results:   Recent Labs  04/09/13 1130 04/10/13 0615  WBC 14.8* 10.1  HGB 15.9* 15.4*  HCT 47.3* 46.7*  PLT 338 306   BMET  Recent Labs  04/09/13 1130 04/10/13 0615  NA  --  137  K  --  4.2  CL  --  98  CO2  --  34*  GLUCOSE  --  112*  BUN  --  9  CREATININE 0.73 0.66  CALCIUM  --  9.0    Anti-infectives: Anti-infectives   Start     Dose/Rate Route Frequency Ordered Stop   04/09/13 1400  ceFAZolin (ANCEF) IVPB 2 g/50 mL premix     2 g 100 mL/hr over 30 Minutes Intravenous 3 times per day 04/09/13 1106 04/10/13 0629   04/09/13 0600  ceFAZolin (ANCEF) 3 g in dextrose 5 % 50 mL IVPB     3 g 160 mL/hr over 30 Minutes Intravenous On call to O.R. 04/08/13 1439 04/09/13 0736      Assessment/Plan: POD 2 lumbar incisional hernia repair 1. Will dc pca, add norco, cont ms contin and iv morphine for backup 2. Needs more pulm toilet, I think low grade fever coming from pulm source 3. Regular diet, will increase protonix to twice daily due to reflux 4. Scds, sq heparin 5. Dc foley today 6. Dispo: hopefully home in next 48 hours   [facility] 04/11/2013

## 2013-04-11 NOTE — Progress Notes (Signed)
PT Cancellation Note  Patient Details Name: Brenda Frank MRN: [id number] DOB: Jan 13, 1971   Cancelled Treatment:    Reason Eval/Treat Not Completed: Other (comment) pt sleeping soundly upon knocking on door twice this morning.  Will check back as schedule permits.   Kimberl Vig,KATHrine E 04/11/2013, 11:57 AM Zenovia Jarred, PT, DPT 04/11/2013 Pager: [phone]

## 2013-04-12 ENCOUNTER — Telehealth (INDEPENDENT_AMBULATORY_CARE_PROVIDER_SITE_OTHER): Payer: Self-pay

## 2013-04-12 NOTE — Progress Notes (Signed)
3 Days Post-Op  Subjective: Feels well, wants to go home, no n/v, tol diet  Objective: Vital signs in last 24 hours: Temp:  [98 F (36.7 C)-99.4 F (37.4 C)] 98.4 F (36.9 C) (08/15 0503) Pulse Rate:  [93-118] 93 (08/15 0503) Resp:  [15-18] 18 (08/15 0503) BP: (91-108)/(53-74) 93/53 mmHg (08/15 0503) SpO2:  [89 %-98 %] 89 % (08/15 0503) Last BM Date:  (PTA)  Intake/Output from previous day: 08/14 0701 - 08/15 0700 In: 360 [P.O.:360] Out: 280 [Urine:250; Drains:30] Intake/Output this shift:    General appearance: no distress GI: soft nontender, incision clean without infection, drain serous  Lab Results:   Recent Labs  04/09/13 1130 04/10/13 0615  WBC 14.8* 10.1  HGB 15.9* 15.4*  HCT 47.3* 46.7*  PLT 338 306   BMET  Recent Labs  04/09/13 1130 04/10/13 0615  NA  --  137  K  --  4.2  CL  --  98  CO2  --  34*  GLUCOSE  --  112*  BUN  --  9  CREATININE 0.73 0.66  CALCIUM  --  9.0    Assessment/Plan: POD 3 lumbar incisional hernia repair 1. Regular pain meds, she has all at home already 2. Dc home today 3. Follow up next week for drain removal   [facility] 04/12/2013

## 2013-04-12 NOTE — Telephone Encounter (Signed)
Called pt to give her a f/u appt with Dr Dwain Sarna for 8/22 arrive at 8:45 for 9:00.

## 2013-04-12 NOTE — Progress Notes (Signed)
DC home with wife. Verbally understood DC instructions, no questions ask. Supplies and drain sheet sent home with pt.. Wife is nurse and states she knows how to care for drain.

## 2013-04-13 ENCOUNTER — Inpatient Hospital Stay (HOSPITAL_COMMUNITY)
Admission: EM | Admit: 2013-04-13 | Discharge: 2013-04-17 | DRG: 871 | Disposition: A | Discharge status: Home Health | Payer: 59 | Attending: Family Medicine | Admitting: Family Medicine

## 2013-04-13 ENCOUNTER — Emergency Department (HOSPITAL_COMMUNITY): Payer: 59

## 2013-04-13 ENCOUNTER — Encounter (HOSPITAL_COMMUNITY): Payer: Self-pay | Admitting: *Deleted

## 2013-04-13 DIAGNOSIS — J449 Chronic obstructive pulmonary disease, unspecified: Secondary | ICD-10-CM | POA: Diagnosis present

## 2013-04-13 DIAGNOSIS — Z79899 Other long term (current) drug therapy: Secondary | ICD-10-CM

## 2013-04-13 DIAGNOSIS — G9340 Encephalopathy, unspecified: Secondary | ICD-10-CM | POA: Diagnosis present

## 2013-04-13 DIAGNOSIS — J9601 Acute respiratory failure with hypoxia: Secondary | ICD-10-CM

## 2013-04-13 DIAGNOSIS — D45 Polycythemia vera: Secondary | ICD-10-CM | POA: Diagnosis present

## 2013-04-13 DIAGNOSIS — M545 Low back pain, unspecified: Secondary | ICD-10-CM | POA: Diagnosis present

## 2013-04-13 DIAGNOSIS — R21 Rash and other nonspecific skin eruption: Secondary | ICD-10-CM | POA: Diagnosis present

## 2013-04-13 DIAGNOSIS — I10 Essential (primary) hypertension: Secondary | ICD-10-CM | POA: Diagnosis present

## 2013-04-13 DIAGNOSIS — Z981 Arthrodesis status: Secondary | ICD-10-CM

## 2013-04-13 DIAGNOSIS — Z8659 Personal history of other mental and behavioral disorders: Secondary | ICD-10-CM

## 2013-04-13 DIAGNOSIS — Z90710 Acquired absence of both cervix and uterus: Secondary | ICD-10-CM

## 2013-04-13 DIAGNOSIS — J189 Pneumonia, unspecified organism: Secondary | ICD-10-CM | POA: Diagnosis present

## 2013-04-13 DIAGNOSIS — K219 Gastro-esophageal reflux disease without esophagitis: Secondary | ICD-10-CM | POA: Diagnosis present

## 2013-04-13 DIAGNOSIS — J9600 Acute respiratory failure, unspecified whether with hypoxia or hypercapnia: Secondary | ICD-10-CM | POA: Diagnosis present

## 2013-04-13 DIAGNOSIS — Z87891 Personal history of nicotine dependence: Secondary | ICD-10-CM

## 2013-04-13 DIAGNOSIS — F319 Bipolar disorder, unspecified: Secondary | ICD-10-CM | POA: Diagnosis present

## 2013-04-13 DIAGNOSIS — Z720 Tobacco use: Secondary | ICD-10-CM | POA: Diagnosis present

## 2013-04-13 DIAGNOSIS — R946 Abnormal results of thyroid function studies: Secondary | ICD-10-CM | POA: Diagnosis present

## 2013-04-13 DIAGNOSIS — K5900 Constipation, unspecified: Secondary | ICD-10-CM | POA: Diagnosis present

## 2013-04-13 DIAGNOSIS — E119 Type 2 diabetes mellitus without complications: Secondary | ICD-10-CM | POA: Diagnosis present

## 2013-04-13 DIAGNOSIS — I498 Other specified cardiac arrhythmias: Secondary | ICD-10-CM | POA: Diagnosis present

## 2013-04-13 DIAGNOSIS — I509 Heart failure, unspecified: Secondary | ICD-10-CM | POA: Diagnosis present

## 2013-04-13 DIAGNOSIS — A419 Sepsis, unspecified organism: Principal | ICD-10-CM | POA: Diagnosis present

## 2013-04-13 LAB — LACTIC ACID, PLASMA: Lactic Acid, Venous: 1.5 mmol/L (ref 0.5–2.2)

## 2013-04-13 LAB — BLOOD GAS, ARTERIAL
Acid-Base Excess: 2.9 mmol/L — ABNORMAL HIGH (ref 0.0–2.0)
FIO2: 50 %
O2 Saturation: 96 %
Patient temperature: 37
pO2, Arterial: 83.5 mmHg (ref 80.0–100.0)

## 2013-04-13 LAB — COMPREHENSIVE METABOLIC PANEL
Albumin: 3 g/dL — ABNORMAL LOW (ref 3.5–5.2)
Alkaline Phosphatase: 81 U/L (ref 39–117)
BUN: 10 mg/dL (ref 6–23)
Calcium: 9.4 mg/dL (ref 8.4–10.5)
Creatinine, Ser: 0.55 mg/dL (ref 0.50–1.10)
GFR calc Af Amer: >90 mL/min (ref >90)
Glucose, Bld: 165 mg/dL — ABNORMAL HIGH (ref 70–99)
Total Protein: 6.5 g/dL (ref 6.0–8.3)

## 2013-04-13 LAB — CBC WITH DIFFERENTIAL/PLATELET
Basophils Absolute: 0 10*3/uL (ref 0.0–0.1)
HCT: 46.4 % — ABNORMAL HIGH (ref 36.0–46.0)
Lymphocytes Relative: 8 % — ABNORMAL LOW (ref 12–46)
Lymphs Abs: 0.9 10*3/uL (ref 0.7–4.0)
MCV: 92.1 fL (ref 78.0–100.0)
Monocytes Absolute: 0.3 10*3/uL (ref 0.1–1.0)
Neutro Abs: 10.9 10*3/uL — ABNORMAL HIGH (ref 1.7–7.7)
RBC: 5.04 MIL/uL (ref 3.87–5.11)
RDW: 13.2 % (ref 11.5–15.5)
WBC: 12.2 10*3/uL — ABNORMAL HIGH (ref 4.0–10.5)

## 2013-04-13 LAB — URINALYSIS, ROUTINE W REFLEX MICROSCOPIC
Bilirubin Urine: NEGATIVE
Hgb urine dipstick: NEGATIVE
Ketones, ur: NEGATIVE mg/dL
Specific Gravity, Urine: <1.005 — ABNORMAL LOW (ref 1.005–1.030)
pH: 6 (ref 5.0–8.0)

## 2013-04-13 LAB — LIPASE, BLOOD: Lipase: 28 U/L (ref 11–59)

## 2013-04-13 LAB — STREP PNEUMONIAE URINARY ANTIGEN: Strep Pneumo Urinary Antigen: NEGATIVE

## 2013-04-13 LAB — MRSA PCR SCREENING: MRSA by PCR: NEGATIVE

## 2013-04-13 LAB — PRO B NATRIURETIC PEPTIDE: Pro B Natriuretic peptide (BNP): 233.1 pg/mL — ABNORMAL HIGH (ref 0–125)

## 2013-04-13 MED ORDER — HEPARIN SODIUM (PORCINE) 5000 UNIT/ML IJ SOLN
5000.0000 [IU] | Freq: Three times a day (TID) | INTRAMUSCULAR | Status: DC
  Administered 2013-04-13 – 2013-04-17 (×13): 5000 [IU] via SUBCUTANEOUS
  Filled 2013-04-13 (×13): qty 1

## 2013-04-13 MED ORDER — HYDROCORTISONE 0.5 % EX CREA
TOPICAL_CREAM | Freq: Three times a day (TID) | CUTANEOUS | Status: DC
  Filled 2013-04-13: qty 28.35

## 2013-04-13 MED ORDER — HYDROCORTISONE 1 % EX OINT
TOPICAL_OINTMENT | Freq: Three times a day (TID) | CUTANEOUS | Status: DC
  Administered 2013-04-14 – 2013-04-16 (×10): via TOPICAL
  Filled 2013-04-13: qty 28.35

## 2013-04-13 MED ORDER — SODIUM CHLORIDE 0.9 % IV BOLUS (SEPSIS)
1000.0000 mL | Freq: Once | INTRAVENOUS | Status: AC
  Administered 2013-04-13: 1000 mL via INTRAVENOUS

## 2013-04-13 MED ORDER — ACETAMINOPHEN 325 MG RE SUPP
RECTAL | Status: AC
  Administered 2013-04-13: 325 mg via RECTAL
  Filled 2013-04-13: qty 1

## 2013-04-13 MED ORDER — ACETAMINOPHEN 325 MG RE SUPP: 325.0000 mg | Freq: Once | RECTAL | Status: AC

## 2013-04-13 MED ORDER — MORPHINE SULFATE ER 15 MG PO TBCR
60.0000 mg | EXTENDED_RELEASE_TABLET | Freq: Two times a day (BID) | ORAL | Status: DC
  Administered 2013-04-13 – 2013-04-17 (×8): 60 mg via ORAL
  Filled 2013-04-13 (×8): qty 4

## 2013-04-13 MED ORDER — VANCOMYCIN HCL IN DEXTROSE 1-5 GM/200ML-% IV SOLN: 1000.0000 mg | Freq: Once | INTRAVENOUS | Status: DC

## 2013-04-13 MED ORDER — VANCOMYCIN HCL IN DEXTROSE 1-5 GM/200ML-% IV SOLN
1000.0000 mg | Freq: Once | INTRAVENOUS | Status: AC
  Administered 2013-04-13: 1000 mg via INTRAVENOUS

## 2013-04-13 MED ORDER — CLONAZEPAM 0.5 MG PO TABS
2.0000 mg | ORAL_TABLET | Freq: Four times a day (QID) | ORAL | Status: DC | PRN
  Administered 2013-04-13 – 2013-04-16 (×8): 2 mg via ORAL
  Filled 2013-04-13 (×8): qty 4

## 2013-04-13 MED ORDER — ACLIDINIUM BROMIDE 400 MCG/ACT IN AEPB: 1.0000 | INHALATION_SPRAY | Freq: Two times a day (BID) | RESPIRATORY_TRACT | Status: DC

## 2013-04-13 MED ORDER — IPRATROPIUM-ALBUTEROL 0.5-2.5 (3) MG/3ML IN SOLN: 3.0000 mL | RESPIRATORY_TRACT | Status: DC | PRN

## 2013-04-13 MED ORDER — SODIUM CHLORIDE 0.9 % IV SOLN
1000.0000 mL | Freq: Once | INTRAVENOUS | Status: AC
  Administered 2013-04-13: 1000 mL via INTRAVENOUS

## 2013-04-13 MED ORDER — PANTOPRAZOLE SODIUM 40 MG PO TBEC: 40.0000 mg | DELAYED_RELEASE_TABLET | Freq: Every day | ORAL | Status: DC | PRN

## 2013-04-13 MED ORDER — LITHIUM CARBONATE ER 300 MG PO TBCR
600.0000 mg | EXTENDED_RELEASE_TABLET | Freq: Two times a day (BID) | ORAL | Status: DC
  Administered 2013-04-13 – 2013-04-17 (×9): 600 mg via ORAL
  Filled 2013-04-13 (×13): qty 2

## 2013-04-13 MED ORDER — SODIUM CHLORIDE 0.9 % IV SOLN: INTRAVENOUS | Status: DC

## 2013-04-13 MED ORDER — VANCOMYCIN HCL IN DEXTROSE 1-5 GM/200ML-% IV SOLN
1000.0000 mg | Freq: Once | INTRAVENOUS | Status: AC
  Administered 2013-04-13: 1000 mg via INTRAVENOUS
  Filled 2013-04-13: qty 200

## 2013-04-13 MED ORDER — IPRATROPIUM BROMIDE 0.02 % IN SOLN: 0.5000 mg | RESPIRATORY_TRACT | Status: DC | PRN

## 2013-04-13 MED ORDER — NALOXONE HCL 0.4 MG/ML IJ SOLN
0.4000 mg | Freq: Once | INTRAMUSCULAR | Status: AC
  Administered 2013-04-13: 0.4 mg via INTRAVENOUS
  Filled 2013-04-13: qty 1

## 2013-04-13 MED ORDER — GUAIFENESIN ER 600 MG PO TB12
600.0000 mg | ORAL_TABLET | Freq: Two times a day (BID) | ORAL | Status: DC
  Administered 2013-04-13 – 2013-04-17 (×8): 600 mg via ORAL
  Filled 2013-04-13 (×8): qty 1

## 2013-04-13 MED ORDER — DEXTROSE 5 % IV SOLN
1.0000 g | Freq: Three times a day (TID) | INTRAVENOUS | Status: DC
  Administered 2013-04-13 – 2013-04-17 (×11): 1 g via INTRAVENOUS
  Filled 2013-04-13 (×13): qty 1

## 2013-04-13 MED ORDER — ASPIRIN EC 81 MG PO TBEC
81.0000 mg | DELAYED_RELEASE_TABLET | Freq: Every day | ORAL | Status: DC
  Administered 2013-04-13 – 2013-04-17 (×5): 81 mg via ORAL
  Filled 2013-04-13 (×5): qty 1

## 2013-04-13 MED ORDER — SENNOSIDES-DOCUSATE SODIUM 8.6-50 MG PO TABS
1.0000 | ORAL_TABLET | Freq: Every day | ORAL | Status: DC | PRN
  Administered 2013-04-13: 1 via ORAL
  Filled 2013-04-13 (×2): qty 1

## 2013-04-13 MED ORDER — PIPERACILLIN-TAZOBACTAM 3.375 G IVPB
3.3750 g | Freq: Three times a day (TID) | INTRAVENOUS | Status: DC
  Filled 2013-04-13 (×6): qty 50

## 2013-04-13 MED ORDER — TIOTROPIUM BROMIDE MONOHYDRATE 18 MCG IN CAPS
18.0000 ug | ORAL_CAPSULE | Freq: Every day | RESPIRATORY_TRACT | Status: DC
  Administered 2013-04-13: 18 ug via RESPIRATORY_TRACT
  Filled 2013-04-13: qty 5

## 2013-04-13 MED ORDER — VANCOMYCIN HCL IN DEXTROSE 1-5 GM/200ML-% IV SOLN
1000.0000 mg | Freq: Once | INTRAVENOUS | Status: DC
  Filled 2013-04-13: qty 200

## 2013-04-13 MED ORDER — ALBUTEROL SULFATE (5 MG/ML) 0.5% IN NEBU: 2.5000 mg | INHALATION_SOLUTION | RESPIRATORY_TRACT | Status: DC | PRN

## 2013-04-13 MED ORDER — VANCOMYCIN HCL IN DEXTROSE 1-5 GM/200ML-% IV SOLN
1000.0000 mg | Freq: Three times a day (TID) | INTRAVENOUS | Status: DC
  Administered 2013-04-13 – 2013-04-17 (×11): 1000 mg via INTRAVENOUS
  Filled 2013-04-13 (×15): qty 200

## 2013-04-13 MED ORDER — HYDROCORTISONE 1 % EX CREA
TOPICAL_CREAM | Freq: Three times a day (TID) | CUTANEOUS | Status: DC
  Filled 2013-04-13: qty 28

## 2013-04-13 MED ORDER — HYDROCORTISONE VALERATE 0.2 % EX CREA: TOPICAL_CREAM | Freq: Three times a day (TID) | CUTANEOUS | Status: DC

## 2013-04-13 MED ORDER — SODIUM CHLORIDE 0.9 % IV SOLN
1000.0000 mL | INTRAVENOUS | Status: DC
  Administered 2013-04-13: 1000 mL via INTRAVENOUS

## 2013-04-13 MED ORDER — HYDROCODONE-ACETAMINOPHEN 10-325 MG PO TABS
1.0000 | ORAL_TABLET | Freq: Four times a day (QID) | ORAL | Status: DC | PRN
  Administered 2013-04-13: 1 via ORAL
  Administered 2013-04-14: 2 via ORAL
  Administered 2013-04-14: 1 via ORAL
  Administered 2013-04-14 – 2013-04-15 (×2): 2 via ORAL
  Administered 2013-04-15: 1 via ORAL
  Administered 2013-04-15 – 2013-04-17 (×5): 2 via ORAL
  Filled 2013-04-13 (×3): qty 2
  Filled 2013-04-13 (×2): qty 1
  Filled 2013-04-13 (×3): qty 2
  Filled 2013-04-13: qty 1
  Filled 2013-04-13 (×2): qty 2

## 2013-04-13 MED ORDER — PIPERACILLIN-TAZOBACTAM 3.375 G IVPB 30 MIN
3.3750 g | Freq: Once | INTRAVENOUS | Status: AC
Start: 2013-04-13 — End: 2013-04-13
  Administered 2013-04-13: 3.375 g via INTRAVENOUS
  Filled 2013-04-13: qty 50

## 2013-04-13 MED ORDER — OXYMORPHONE HCL ER 40 MG PO TB12: 40.0000 mg | ORAL_TABLET | Freq: Two times a day (BID) | ORAL | Status: DC

## 2013-04-13 MED ORDER — SODIUM CHLORIDE 0.9 % IV SOLN
1000.0000 mL | INTRAVENOUS | Status: DC
  Administered 2013-04-14: 1000 mL via INTRAVENOUS

## 2013-04-13 NOTE — Progress Notes (Signed)
ANTIBIOTIC CONSULT NOTE - INITIAL  Pharmacy Consult for Vancomycin and Zosyn Indication: sepsis  Allergies  Allergen Reactions  . Haldol [Haloperidol Decanoate] Other (See Comments)    Dystonic reaction, muscle cramping  . Ultram [Tramadol Hcl] Other (See Comments)    Hallucinations  . Ibuprofen Other (See Comments)    Stomach upset inst not to take by dr  . Rogene Houston Other (See Comments)    "if I take more than I/day I start to feel jittery"   Patient Measurements: Height: 5\' 5"  (165.1 cm) Weight: 248 lb 0.3 oz (112.5 kg) IBW/kg (Calculated) : 57 Adjusted Body Weight: 80Kg  Vital Signs: Temp: 101 F (38.3 C) (08/16 1055) Temp src: Core (Comment) (08/16 1055) BP: 91/47 mmHg (08/16 1055) Pulse Rate: 108 (08/16 1055) Intake/Output from previous day:   Intake/Output from this shift: Total I/O In: 2000 [I.V.:2000] Out: 300 [Urine:300]  Labs:  Recent Labs  04/13/13 0744 04/13/13 0801  WBC 12.2*  --   HGB 14.9  --   PLT 290  --   CREATININE  --  0.55   Estimated Creatinine Clearance: 112.2 ml/min (by C-G formula based on Cr of 0.55). No results found for this basename: VANCOTROUGH, Leodis Binet, VANCORANDOM, GENTTROUGH, GENTPEAK, GENTRANDOM, TOBRATROUGH, TOBRAPEAK, TOBRARND, AMIKACINPEAK, AMIKACINTROU, AMIKACIN,  in the last 72 hours   Microbiology: Recent Results (from the past 720 hour(s))  CULTURE, BLOOD (ROUTINE X 2)     Status: None   Collection Time    04/13/13  7:41 AM      Result Value Range Status   Specimen Description Blood   Final   Special Requests NONE   Final   Culture NO GROWTH <24 HRS   Final   Report Status PENDING   Incomplete  CULTURE, BLOOD (ROUTINE X 2)     Status: None   Collection Time    04/13/13  8:01 AM      Result Value Range Status   Specimen Description Blood   Final   Special Requests NONE   Final   Culture NO GROWTH <24 HRS   Final   Report Status PENDING   Incomplete   Medical History: Past Medical History   Diagnosis Date  . Hypertension   . Diverticulosis   . 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 Polycythemia 10/25/2011    Query secondary to smoking or hypoxia. Hemoglobin 16-17 g  . Cecal ulcer 10/26/2011    Per colonoscopy  . Diverticulosis 10/26/2011    Per colonoscopy  . GERD (gastroesophageal reflux disease)   . Hernia, inguinal     rt  . Disorder of vocal cords     done  when intubated for hysterectomy  2008  . Chronic bronchitis 05/03/2012    "gets it 1-2X q yr"  . COPD with emphysema   . Pneumonia 2007  . Exertional dyspnea     "because of the COPD"  . History of duodenal ulcer   . Chronic lower back pain   . Bipolar affective     "under control last 12 years" (05/03/2012)  . Skin cancer of face     "had it taken off"  . Asthma   . Emphysema of lung   . Cough   . Wheezing   . Diabetes mellitus     no rx for 3-4 yrs  lost weight; "diet controlled" (05/03/2012)  . CHF (congestive heart failure)    Medications:  Scheduled:  . sodium chloride   Intravenous STAT  .  piperacillin-tazobactam (ZOSYN)  IV  3.375 g Intravenous Q8H  . vancomycin  1,000 mg Intravenous Once  . vancomycin  1,000 mg Intravenous Q8H   Assessment: 42yo morbidly obese female admitted with fever and sepsis.  Pt was discharged from [facility] yesterday and is s/p surgery for hernia repair.  Pt has good renal fxn.  Estimated Creatinine Clearance: 112.2 ml/min (by C-G formula based on Cr of 0.55).  Goal of Therapy:  Vancomycin trough level 15-20 mcg/ml  Plan:  Vancomycin 2000mg  initially (loading dose) then Vancomycin 1000mg  IV q8hrs Check trough at steady state Zosyn 3.375gm IV q8hrs to be infused over 4 hours Monitor labs, renal fxn, and cultures  Valrie Hart A 04/13/2013,11:11 AM

## 2013-04-13 NOTE — ED Notes (Signed)
ICU called for report, report given

## 2013-04-13 NOTE — ED Notes (Signed)
Pt sleeping chest rise an fall.

## 2013-04-13 NOTE — ED Notes (Signed)
Surgery for hernia x 4 days.  Reports fever and rash.  Took vicodin 2 tabs 5/325mg  at approx 0700 PTA.   Pt alert and oriented x 3 at this time.

## 2013-04-13 NOTE — ED Notes (Signed)
Family updated on plan of care,  

## 2013-04-13 NOTE — ED Notes (Signed)
Pt brought to er by family with c/o confusion, fever, pt [facility] with [facility] repair, had developed rash while  In hospital, rash has become worse, fever that started this am, pt able to state who she is where she is at, but rambles in conversation at times and is confused on other events, keeps stating that she is getting ready for her surgery. Dr. Deretha Emory at bedside upon pt arrival to er, pt sinus tach on monitor, pt had taken two Vicodin at [facility] today,

## 2013-04-13 NOTE — H&P (Signed)
[facility] History and Physical  Brenda Frank:[id number] DOB: 1971-02-20 DOA: 04/13/2013  Referring physician: ER. PCP: Catalina Pizza, MD    Chief Complaint: Dyspnea.  HPI: Brenda Frank is a 42 y.o. female who was recently discharged from [facility] having had incisional hernia repair on the left side. She seems to have done well from the surgical point of view but she became more short of breath in the last couple of days associated with a largely nonproductive cough. She is also been feeling feverish. She comes to the emergency room and requires 50% oxygen to maintain saturations. She appears to have a significant pneumonia on the right side now. She is being admitted for healthcare associated pneumonia. Her blood pressure has been low in the emergency room and she is already been given a couple liters of fluid. Her urine output is good.   Review of Systems:   Apart from history of present illness, other systems negative.  Past Medical History  Diagnosis Date  . Hypertension   . Diverticulosis   . 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 Polycythemia 10/25/2011    Query secondary to smoking or hypoxia. Hemoglobin 16-17 g  . Cecal ulcer 10/26/2011    Per colonoscopy  . Diverticulosis 10/26/2011    Per colonoscopy  . GERD (gastroesophageal reflux disease)   . Hernia, inguinal     rt  . Disorder of vocal cords     done  when intubated for hysterectomy  2008  . Chronic bronchitis 05/03/2012    "gets it 1-2X q yr"  . COPD with emphysema   . Pneumonia 2007  . Exertional dyspnea     "because of the COPD"  . History of duodenal ulcer   . Chronic lower back pain   . Bipolar affective     "under control last 12 years" (05/03/2012)  . Skin cancer of face     "had it taken off"  . Asthma   . Emphysema of lung   . Cough   . Wheezing   . Diabetes mellitus     no rx for 3-4 yrs  lost weight; "diet controlled" (05/03/2012)  . CHF (congestive heart failure)    Past  Surgical History  Procedure Laterality Date  . Colonoscopy  10/25/2011    Procedure: COLONOSCOPY;  Surgeon: Malissa Hippo, MD;  Location: [facility];  Service: Endoscopy;  Laterality: N/A;  . Lateral / posterior combined fusion lumbar spine  05/03/2012  . Bilateral oophorectomy  2012  . Appendectomy  2000's  . Skin cancer excision      facial  . Anterior lat lumbar fusion  05/03/2012    Procedure: ANTERIOR LATERAL LUMBAR FUSION 3 LEVELS;  Surgeon: Tia Alert, MD;  Location: [facility] ORS;  Service: Neurosurgery;  Laterality: Left;  Left lumbar two-three, lumbar three-four, lumbar four-five anteriolateral retroperitoneal interbody fusion with pedicle screws  . Abdominal hysterectomy  10/2006  . Hernia repair  03/2008    "2 abdominal hernias repaired"  . Cholecystectomy  05/2007  . Back surgery    . Incisional hernia repair Left 04/09/2013    Procedure: HERNIA REPAIR INCISIONAL;  Surgeon: Emelia Loron, MD;  Location: [facility];  Service: General;  Laterality: Left;  . Insertion of mesh Left 04/09/2013    Procedure: INSERTION OF MESH;  Surgeon: Emelia Loron, MD;  Location: [facility] OR;  Service: General;  Laterality: Left;   Social History:  reports that she quit  smoking about 2 months ago. Her smoking use included Cigarettes. She has a 14.5 pack-year smoking history. She has never used smokeless tobacco. She reports that she does not drink alcohol or use illicit drugs.   Allergies  Allergen Reactions  . Haldol [Haloperidol Decanoate] Other (See Comments)    Dystonic reaction, muscle cramping  . Ultram [Tramadol Hcl] Other (See Comments)    Hallucinations  . Ibuprofen Other (See Comments)    Stomach upset inst not to take by dr  . Roselee Nova [Pregabalin] Other (See Comments)    "if I take more than I/day I start to feel jittery"    Family History  Problem Relation Age of Onset  . Colon cancer Neg Hx   . Cancer Mother     bone      Prior to Admission medications   Medication Sig  Start Date End Date Taking? Authorizing Provider  Aclidinium Bromide (TUDORZA PRESSAIR) 400 MCG/ACT AEPB Inhale 1 puff into the lungs 2 (two) times daily.   Yes Historical Provider, MD  albuterol (PROVENTIL HFA;VENTOLIN HFA) 108 (90 BASE) MCG/ACT inhaler Inhale 2 puffs into the lungs every 6 (six) hours as needed. For RESCUE.   Yes Historical Provider, MD  aspirin EC 81 MG tablet Take 81 mg by mouth daily.   Yes Historical Provider, MD  clonazePAM (KLONOPIN) 2 MG tablet Take 2 mg by mouth 4 (four) times daily as needed for anxiety. For anxiety   Yes Historical Provider, MD  cyclobenzaprine (FLEXERIL) 10 MG tablet Take 10 mg by mouth 3 (three) times daily as needed for muscle spasms.   Yes Historical Provider, MD  docusate sodium (COLACE) 100 MG capsule Take 100 mg by mouth daily as needed for constipation.   Yes Historical Provider, MD  HYDROcodone-acetaminophen (NORCO) 10-325 MG per tablet Take 1-2 tablets by mouth every 6 (six) hours as needed for pain.    Yes Historical Provider, MD  ipratropium-albuterol (DUONEB) 0.5-2.5 (3) MG/3ML SOLN Take 3 mLs by nebulization every 4 (four) hours as needed (shortness of breath).   Yes Historical Provider, MD  lithium carbonate (LITHOBID) 300 MG CR tablet Take 600 mg by mouth 2 (two) times daily.   Yes Historical Provider, MD  olmesartan-hydrochlorothiazide (BENICAR HCT) 40-25 MG per tablet Take 1 tablet by mouth daily.   Yes Historical Provider, MD  oxymorphone (OPANA ER) 40 MG 12 hr tablet Take 40 mg by mouth every 12 (twelve) hours.   Yes Historical Provider, MD  pantoprazole (PROTONIX) 40 MG tablet Take 40 mg by mouth daily as needed (for reflux).   Yes Historical Provider, MD  senna-docusate (SENOKOT-S) 8.6-50 MG per tablet Take 1 tablet by mouth daily as needed for constipation.   Yes Historical Provider, MD   Physical Exam: Filed Vitals:   04/13/13 1200  BP:   Pulse: 100  Temp: 100.2 F (37.9 C)  Resp: 16     General:  She looks sick. She is  somewhat toxic. She has warm peripheries with bounding pulses.  Eyes: No pallor. No jaundice.  ENT: No abnormalities.  Neck: No lymphadenopathy.  Cardiovascular: Sinus tachycardia, improved since the ER. No gallop rhythm.  Respiratory: Reduced air entry in the right side. No crackles, wheezing or bronchial breathing.  Abdomen: Soft, nontender.  Skin: Left incisional hernia skin wound. Does not look infected.  Musculoskeletal: No acute joint abnormalities.  Psychiatric: Appropriate affect.  Neurologic: Alert and orientated without any focal neurological signs.  Labs on Admission:  Basic Metabolic Panel:  Recent Labs  Lab 04/09/13 1130 04/10/13 0615 04/13/13 0801  NA  --  137 137  K  --  4.2 3.9  CL  --  98 101  CO2  --  34* 29  GLUCOSE  --  112* 165*  BUN  --  9 10  CREATININE 0.73 0.66 0.55  CALCIUM  --  9.0 9.4   Liver Function Tests:  Recent Labs Lab 04/13/13 0801  AST 17  ALT 14  ALKPHOS 81  BILITOT 0.5  PROT 6.5  ALBUMIN 3.0*    Recent Labs Lab 04/13/13 0801  LIPASE 28    CBC:  Recent Labs Lab 04/09/13 1130 04/10/13 0615 04/13/13 0744  WBC 14.8* 10.1 12.2*  NEUTROABS  --   --  10.9*  HGB 15.9* 15.4* 14.9  HCT 47.3* 46.7* 46.4*  MCV 90.1 91.2 92.1  PLT 338 306 290      BNP (last 3 results)  Recent Labs  04/13/13 0801  PROBNP 233.1*   CBG:  Recent Labs Lab 04/09/13 0620 04/09/13 0918  GLUCAP 119* 112*    Radiological Exams on Admission: Dg Chest Portable 1 View  04/13/2013   *RADIOLOGY REPORT*  Clinical Data: Fever.  Recent hernia surgery.  PORTABLE CHEST - 1 VIEW  Comparison: 04/01/2013  Findings: There is extensive airspace disease throughout a large portion of the right lung.  Prominent interstitial markings on the left could represent volume loss and edema.  Heart size is grossly normal.  IMPRESSION: Extensive airspace disease in the right lung is concerning for pneumonia. There may also be a component of pulmonary  edema.   Original Report Authenticated By: Richarda Overlie, M.D.      Assessment/Plan   1. Right-sided healthcare associated pneumonia. 2. Hypertension. Currently hypotensive. 3. Type 2 diabetes mellitus. 4. Tobacco abuse. 5. History of bipolar affective disorder.  Plan: 1. Admit to step down unit. 2. Intravenous fluids aggressively. She may need pressors if blood pressure does not improve with this. 3. Intravenous antibiotics, vancomycin and Maxipime. 4. Control diabetes.   Code Status: Full code.  Family Communication: Discuss plan with patient and her partner at the bedside.   Disposition Plan: Home with medically stable.  Time spent: 45 minutes.  Wilson Singer [facility] Pager [phone].  If 7PM-7AM, please contact night-coverage [email or web address] Password [facility] 04/13/2013, 12:09 PM

## 2013-04-13 NOTE — Plan of Care (Signed)
Problem: Phase I Progression Outcomes Goal: Voiding-avoid urinary catheter unless indicated Outcome: Not Progressing Pt at this time has foley that was inserted per order in ED

## 2013-04-13 NOTE — ED Provider Notes (Signed)
CSN: [id number]     Arrival date & time 04/13/13  0711 History  This chart was scribed for Shelda Jakes, MD,  by Ashley Jacobs, ED Scribe. The patient was seen in room APA19/APA19 and the patient's care was started at 7:33 AM.    First MD Initiated Contact with Patient 04/13/13 [phone]     Chief Complaint  Patient presents with  . Fever   Patient is a 42 y.o. female presenting with fever. The history is provided by a relative, medical records and the patient. No language interpreter was used.  Fever Onset quality:  Sudden Duration:  2 hours Timing:  Constant Chronicity:  New Relieved by:  Nothing Ineffective treatments:  None tried  HPI Comments: LEVEL 5 Caveat Brenda Frank is a 42 y.o. female whose relative presents to the Emergency Department complaining of moderate fever that presented two hrs PTA. Pt recently had surgery at [facility] by Dr. Dwain Sarna to repair a hernia in her left flank 4 days ago and was discharged yesterday. Pt has a hx of COPD and is not currently on oxygen.    Past Medical History  Diagnosis Date  . Hypertension   . Diverticulosis   . 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 Polycythemia 10/25/2011    Query secondary to smoking or hypoxia. Hemoglobin 16-17 g  . Cecal ulcer 10/26/2011    Per colonoscopy  . Diverticulosis 10/26/2011    Per colonoscopy  . GERD (gastroesophageal reflux disease)   . Hernia, inguinal     rt  . Disorder of vocal cords     done  when intubated for hysterectomy  2008  . Chronic bronchitis 05/03/2012    "gets it 1-2X q yr"  . COPD with emphysema   . Pneumonia 2007  . Exertional dyspnea     "because of the COPD"  . History of duodenal ulcer   . Chronic lower back pain   . Bipolar affective     "under control last 12 years" (05/03/2012)  . Skin cancer of face     "had it taken off"  . Asthma   . Emphysema of lung   . Cough   . Wheezing   . Diabetes mellitus     no rx for 3-4 yrs  lost weight; "diet controlled"  (05/03/2012)  . CHF (congestive heart failure)    Past Surgical History  Procedure Laterality Date  . Colonoscopy  10/25/2011    Procedure: COLONOSCOPY;  Surgeon: Malissa Hippo, MD;  Location: [facility];  Service: Endoscopy;  Laterality: N/A;  . Lateral / posterior combined fusion lumbar spine  05/03/2012  . Bilateral oophorectomy  2012  . Appendectomy  2000's  . Skin cancer excision      facial  . Anterior lat lumbar fusion  05/03/2012    Procedure: ANTERIOR LATERAL LUMBAR FUSION 3 LEVELS;  Surgeon: Tia Alert, MD;  Location: [facility];  Service: Neurosurgery;  Laterality: Left;  Left lumbar two-three, lumbar three-four, lumbar four-five anteriolateral retroperitoneal interbody fusion with pedicle screws  . Abdominal hysterectomy  10/2006  . Hernia repair  03/2008    "2 abdominal hernias repaired"  . Cholecystectomy  05/2007  . Back surgery    . Incisional hernia repair Left 04/09/2013    Procedure: HERNIA REPAIR INCISIONAL;  Surgeon: Emelia Loron, MD;  Location: [facility] OR;  Service: General;  Laterality: Left;  . Insertion of mesh Left 04/09/2013    Procedure: INSERTION OF  MESH;  Surgeon: Emelia Loron, MD;  Location: [facility] OR;  Service: General;  Laterality: Left;   Family History  Problem Relation Age of Onset  . Colon cancer Neg Hx   . Cancer Mother     bone   History  Substance Use Topics  . Smoking status: Former Smoker -- 0.50 packs/day for 29 years    Types: Cigarettes    Quit date: 01/25/2013  . Smokeless tobacco: Never Used     Comment: 05/03/2012 Patient is currently using the Blue cigarettes to help stop smoking/using smokeless now  . Alcohol Use: No   OB History   Grav Para Term Preterm Abortions TAB SAB Ect Mult Living                 Review of Systems  Unable to perform ROS: Mental status change  Constitutional: Positive for fever.    Allergies  Haldol; Ultram; Ibuprofen; and Lyrica  Home Medications   Current Outpatient Rx  Name  Route  Sig   Dispense  Refill  . Aclidinium Bromide (TUDORZA PRESSAIR) 400 MCG/ACT AEPB   Inhalation   Inhale 1 puff into the lungs 2 (two) times daily.         Marland Kitchen albuterol (PROVENTIL HFA;VENTOLIN HFA) 108 (90 BASE) MCG/ACT inhaler   Inhalation   Inhale 2 puffs into the lungs every 6 (six) hours as needed. For RESCUE.         Marland Kitchen aspirin EC 81 MG tablet   Oral   Take 81 mg by mouth daily.         . clonazePAM (KLONOPIN) 2 MG tablet   Oral   Take 2 mg by mouth 4 (four) times daily as needed for anxiety. For anxiety         . cyclobenzaprine (FLEXERIL) 10 MG tablet   Oral   Take 10 mg by mouth 3 (three) times daily as needed for muscle spasms.         Marland Kitchen docusate sodium (COLACE) 100 MG capsule   Oral   Take 100 mg by mouth daily as needed for constipation.         Marland Kitchen HYDROcodone-acetaminophen (NORCO) 10-325 MG per tablet   Oral   Take 1-2 tablets by mouth every 6 (six) hours as needed for pain.          Marland Kitchen ipratropium-albuterol (DUONEB) 0.5-2.5 (3) MG/3ML SOLN   Nebulization   Take 3 mLs by nebulization every 4 (four) hours as needed (shortness of breath).         Marland Kitchen lithium carbonate (LITHOBID) 300 MG CR tablet   Oral   Take 600 mg by mouth 2 (two) times daily.         Marland Kitchen olmesartan-hydrochlorothiazide (BENICAR HCT) 40-25 MG per tablet   Oral   Take 1 tablet by mouth daily.         Marland Kitchen oxymorphone (OPANA ER) 40 MG 12 hr tablet   Oral   Take 40 mg by mouth every 12 (twelve) hours.         . pantoprazole (PROTONIX) 40 MG tablet   Oral   Take 40 mg by mouth daily as needed (for reflux).         Marland Kitchen senna-docusate (SENOKOT-S) 8.6-50 MG per tablet   Oral   Take 1 tablet by mouth daily as needed for constipation.          BP 119/65  Pulse 160  Temp(Src) 102.8 F (39.3 C) (Oral)  Resp  20  Ht 5\' 5"  (1.651 m)  Wt 245 lb (111.131 kg)  BMI 40.77 kg/m2  SpO2 76% Physical Exam  Nursing note and vitals reviewed. Constitutional: She appears well-developed and  well-nourished.  HENT:  Head: Normocephalic.  Mucus membrane dry  Neck: Normal range of motion.  Cardiovascular: Regular rhythm and normal heart sounds.   No murmur heard. 156 heart rate 104/59 BP tachycardic  Pulmonary/Chest: Effort normal and breath sounds normal. No respiratory distress. She has no wheezes. She has no rales.  Sat 88%   Abdominal: Soft. Bowel sounds are normal. There is no tenderness.  Dec bowl sounds  L surgical staples non-infected JP drainage site and bulb with serosanguineous fluids   Musculoskeletal: Normal range of motion.  Neurological: No cranial nerve deficit. She exhibits normal muscle tone. Coordination normal.  Confused sleepy  Skin: Skin is warm.    ED Course  DIAGNOSTIC STUDIES: Oxygen Saturation is 76% on [place], low by my interpretation.    COORDINATION OF CARE: 7:39 AM Discussed course of care with pt's relative. Pt's relative understands and agrees.    Procedures (including critical care time) .  Labs Reviewed  CBC WITH DIFFERENTIAL - Abnormal; Notable for the following:    WBC 12.2 (*)    HCT 46.4 (*)    Neutrophils Relative % 90 (*)    Neutro Abs 10.9 (*)    Lymphocytes Relative 8 (*)    All other components within normal limits  URINALYSIS, ROUTINE W REFLEX MICROSCOPIC - Abnormal; Notable for the following:    Specific Gravity, Urine <1.005 (*)    All other components within normal limits  BLOOD GAS, ARTERIAL - Abnormal; Notable for the following:    pCO2 arterial 48.7 (*)    Bicarbonate 27.7 (*)    Acid-Base Excess 2.9 (*)    All other components within normal limits  COMPREHENSIVE METABOLIC PANEL - Abnormal; Notable for the following:    Glucose, Bld 165 (*)    Albumin 3.0 (*)    All other components within normal limits  PRO B NATRIURETIC PEPTIDE - Abnormal; Notable for the following:    Pro B Natriuretic peptide (BNP) 233.1 (*)    All other components within normal limits  CULTURE, BLOOD (ROUTINE X 2)  CULTURE, BLOOD  (ROUTINE X 2)  URINE CULTURE  LACTIC ACID, PLASMA  LIPASE, BLOOD    Date: 04/13/2013  Rate: 157  Rhythm: sinus tachycardia  QRS Axis: normal  Intervals: normal  ST/T Wave abnormalities: nonspecific ST/T changes  Conduction Disutrbances:none  Narrative Interpretation:   Old EKG Reviewed: none available   Results for orders placed during the hospital encounter of 04/13/13  CULTURE, BLOOD (ROUTINE X 2)      Result Value Range   Specimen Description Blood     Special Requests NONE     Culture NO GROWTH <24 HRS     Report Status PENDING    CULTURE, BLOOD (ROUTINE X 2)      Result Value Range   Specimen Description Blood     Special Requests NONE     Culture NO GROWTH <24 HRS     Report Status PENDING    CBC WITH DIFFERENTIAL      Result Value Range   WBC 12.2 (*) 4.0 - 10.5 K/uL   RBC 5.04  3.87 - 5.11 MIL/uL   Hemoglobin 14.9  12.0 - 15.0 g/dL   HCT 62.1 (*) 30.8 - 65.7 %   MCV 92.1  78.0 - 100.0 fL  MCH 29.6  26.0 - 34.0 pg   MCHC 32.1  30.0 - 36.0 g/dL   RDW 16.1  09.6 - 04.5 %   Platelets 290  150 - 400 K/uL   Neutrophils Relative % 90 (*) 43 - 77 %   Neutro Abs 10.9 (*) 1.7 - 7.7 K/uL   Lymphocytes Relative 8 (*) 12 - 46 %   Lymphs Abs 0.9  0.7 - 4.0 K/uL   Monocytes Relative 3  3 - 12 %   Monocytes Absolute 0.3  0.1 - 1.0 K/uL   Eosinophils Relative 0  0 - 5 %   Eosinophils Absolute 0.0  0.0 - 0.7 K/uL   Basophils Relative 0  0 - 1 %   Basophils Absolute 0.0  0.0 - 0.1 K/uL  URINALYSIS, ROUTINE W REFLEX MICROSCOPIC      Result Value Range   Color, Urine YELLOW  YELLOW   APPearance CLEAR  CLEAR   Specific Gravity, Urine <1.005 (*) 1.005 - 1.030   pH 6.0  5.0 - 8.0   Glucose, UA NEGATIVE  NEGATIVE mg/dL   Hgb urine dipstick NEGATIVE  NEGATIVE   Bilirubin Urine NEGATIVE  NEGATIVE   Ketones, ur NEGATIVE  NEGATIVE mg/dL   Protein, ur NEGATIVE  NEGATIVE mg/dL   Urobilinogen, UA 0.2  0.0 - 1.0 mg/dL   Nitrite NEGATIVE  NEGATIVE   Leukocytes, UA NEGATIVE   NEGATIVE  LACTIC ACID, PLASMA      Result Value Range   Lactic Acid, Venous 1.5  0.5 - 2.2 mmol/L  BLOOD GAS, ARTERIAL      Result Value Range   FIO2 50.00     Delivery systems OXYGEN MASK     pH, Arterial 7.373  7.350 - 7.450   pCO2 arterial 48.7 (*) 35.0 - 45.0 mmHg   pO2, Arterial 83.5  80.0 - 100.0 mmHg   Bicarbonate 27.7 (*) 20.0 - 24.0 mEq/L   TCO2 24.7  0 - 100 mmol/L   Acid-Base Excess 2.9 (*) 0.0 - 2.0 mmol/L   O2 Saturation 96.0     Patient temperature 37.0     Collection site RIGHT RADIAL     Drawn by COLLECTED BY RT     Sample type ARTERIAL     Allens test (pass/fail) PASS  PASS  COMPREHENSIVE METABOLIC PANEL      Result Value Range   Sodium 137  135 - 145 mEq/L   Potassium 3.9  3.5 - 5.1 mEq/L   Chloride 101  96 - 112 mEq/L   CO2 29  19 - 32 mEq/L   Glucose, Bld 165 (*) 70 - 99 mg/dL   BUN 10  6 - 23 mg/dL   Creatinine, Ser 4.09  0.50 - 1.10 mg/dL   Calcium 9.4  8.4 - 81.1 mg/dL   Total Protein 6.5  6.0 - 8.3 g/dL   Albumin 3.0 (*) 3.5 - 5.2 g/dL   AST 17  0 - 37 U/L   ALT 14  0 - 35 U/L   Alkaline Phosphatase 81  39 - 117 U/L   Total Bilirubin 0.5  0.3 - 1.2 mg/dL   GFR calc non Af Amer >90  >90 mL/min   GFR calc Af Amer >90  >90 mL/min  LIPASE, BLOOD      Result Value Range   Lipase 28  11 - 59 U/L  PRO B NATRIURETIC PEPTIDE      Result Value Range   Pro B Natriuretic peptide (BNP) 233.1 (*)  0 - 125 pg/mL      1. HCAP (healthcare-associated pneumonia)       CRITICAL CARE Performed by: Shelda Jakes. Total critical care time: 30 Critical care time was exclusive of separately billable procedures and treating other patients. Critical care was necessary to treat or prevent imminent or life-threatening deterioration. Critical care was time spent personally by me on the following activities: development of treatment plan with patient and/or surrogate as well as nursing, discussions with consultants, evaluation of patient's response to treatment,  examination of patient, obtaining history from patient or surrogate, ordering and performing treatments and interventions, ordering and review of laboratory studies, ordering and review of radiographic studies, pulse oximetry and re-evaluation of patient's condition.  Discussed with Dr. Lindie Spruce [facility] [place] [facility] they agree that it appears that it is a pneumonia surgical site area without evidence of infection or abnormalities. They were comfortable with the patient being admitted here they do not feel it needs to be brought down for them to continue to follow the patient. We'll discuss with hospitalist team here at [facility].  MDM  Hospitalist will omit here at [facility] to step down. Temporary orders completed. Patient arrived with marked fever 102.8 confused marked tachycardia 157. Blood pressure technically normal 104 systolic. Patient's oxygen saturation off of oxygen was 76%. However is when the room patient was on 4 L of oxygen sats were 80%. Patient changed over to 50% Ventimask with assistance from respiratory therapy. BiPAP was considered. On the day mass patient's oxygen levels came up into the 90s. Patient was given 0.4 Narcan confusion resolved patient suctioned patient improved. Chest x-ray consistent with a right-sided pneumonia. Perhaps some fluid. Patient treated as sepsis sepsis protocol done blood cultures done antibiotic started Zosyn and vancomycin. Patient will need to be watched carefully from a ventilatory standpoint may end up needing BiPAP. Patient's surgical wound as stated above without any significant findings or abnormalities. Suspect pre-septic picture due to to healthcare associated pneumonia. Also important to mention patient's lactic acid was normal. With fluids patient's heart rate is come down to the 130s it is a sinus tach her blood pressure has remained above 100. Temperature has been difficult to control. Patient was accompanied by her sister who is a nurse  all this information relayed to her real time as things developed.    I personally performed the services described in this documentation, which was scribed in my presence. The recorded information has been reviewed and is accurate.     Shelda Jakes, MD 04/13/13 1001

## 2013-04-13 NOTE — ED Notes (Signed)
Pt resting on her right side, pillow placed behind her left back for comfort, family at bedside,

## 2013-04-14 DIAGNOSIS — A419 Sepsis, unspecified organism: Secondary | ICD-10-CM | POA: Diagnosis present

## 2013-04-14 DIAGNOSIS — F17200 Nicotine dependence, unspecified, uncomplicated: Secondary | ICD-10-CM

## 2013-04-14 DIAGNOSIS — J449 Chronic obstructive pulmonary disease, unspecified: Secondary | ICD-10-CM | POA: Diagnosis present

## 2013-04-14 LAB — URINE CULTURE
Colony Count: NO GROWTH
Culture: NO GROWTH

## 2013-04-14 LAB — COMPREHENSIVE METABOLIC PANEL
AST: 11 U/L (ref 0–37)
Albumin: 2.1 g/dL — ABNORMAL LOW (ref 3.5–5.2)
CO2: 30 mEq/L (ref 19–32)
Calcium: 8 mg/dL — ABNORMAL LOW (ref 8.4–10.5)
Creatinine, Ser: 0.57 mg/dL (ref 0.50–1.10)
GFR calc non Af Amer: >90 mL/min (ref >90)
Sodium: 137 mEq/L (ref 135–145)
Total Protein: 5.1 g/dL — ABNORMAL LOW (ref 6.0–8.3)

## 2013-04-14 LAB — GLUCOSE, CAPILLARY: Glucose-Capillary: 203 mg/dL — ABNORMAL HIGH (ref 70–99)

## 2013-04-14 LAB — CBC
HCT: 36.4 % (ref 36.0–46.0)
Hemoglobin: 11.3 g/dL — ABNORMAL LOW (ref 12.0–15.0)
MCH: 29.4 pg (ref 26.0–34.0)
RBC: 3.84 MIL/uL — ABNORMAL LOW (ref 3.87–5.11)

## 2013-04-14 LAB — LITHIUM LEVEL: Lithium Lvl: 0.85 mEq/L (ref 0.80–1.40)

## 2013-04-14 MED ORDER — METHYLPREDNISOLONE SODIUM SUCC 125 MG IJ SOLR
125.0000 mg | Freq: Four times a day (QID) | INTRAMUSCULAR | Status: DC
  Administered 2013-04-14 – 2013-04-16 (×9): 125 mg via INTRAVENOUS
  Filled 2013-04-14 (×9): qty 2

## 2013-04-14 MED ORDER — IPRATROPIUM BROMIDE 0.02 % IN SOLN
0.5000 mg | RESPIRATORY_TRACT | Status: DC
  Administered 2013-04-14 – 2013-04-17 (×12): 0.5 mg via RESPIRATORY_TRACT
  Filled 2013-04-14 (×13): qty 2.5

## 2013-04-14 MED ORDER — ALBUTEROL SULFATE (5 MG/ML) 0.5% IN NEBU
2.5000 mg | INHALATION_SOLUTION | RESPIRATORY_TRACT | Status: DC
  Administered 2013-04-14 – 2013-04-17 (×13): 2.5 mg via RESPIRATORY_TRACT
  Filled 2013-04-14 (×15): qty 0.5

## 2013-04-14 MED ORDER — DIPHENHYDRAMINE HCL 25 MG PO CAPS
25.0000 mg | ORAL_CAPSULE | Freq: Four times a day (QID) | ORAL | Status: DC | PRN
  Administered 2013-04-14 – 2013-04-16 (×6): 25 mg via ORAL
  Filled 2013-04-14 (×6): qty 1

## 2013-04-14 MED ORDER — SODIUM CHLORIDE 0.9 % IV BOLUS (SEPSIS)
500.0000 mL | Freq: Once | INTRAVENOUS | Status: AC
  Administered 2013-04-14: 500 mL via INTRAVENOUS

## 2013-04-14 NOTE — Consult Note (Signed)
Consult requested by: Dr. Karilyn Cota Consult requested for healthcare associated pneumonia:  HPI: This is a 42 year old Caucasian female who came to the emergency department because of fever. She had just been discharged from hospital after an incisional hernia repair. She said that she developed a feeling of being cold. Her heart rate was up. When she was evaluated in the emergency room she was found to have pneumonia. She has been coughing some. She's coughed up some sputum. She says her heart rate has been high and her blood pressure has been lower than usual. She has had some pleuritic chest pain.  Past Medical History  Diagnosis Date  . Hypertension   . Diverticulosis   . 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 Polycythemia 10/25/2011    Query secondary to smoking or hypoxia. Hemoglobin 16-17 g  . Cecal ulcer 10/26/2011    Per colonoscopy  . Diverticulosis 10/26/2011    Per colonoscopy  . GERD (gastroesophageal reflux disease)   . Hernia, inguinal     rt  . Disorder of vocal cords     done  when intubated for hysterectomy  2008  . Chronic bronchitis 05/03/2012    "gets it 1-2X q yr"  . COPD with emphysema   . Pneumonia 2007  . Exertional dyspnea     "because of the COPD"  . History of duodenal ulcer   . Chronic lower back pain   . Bipolar affective     "under control last 12 years" (05/03/2012)  . Skin cancer of face     "had it taken off"  . Asthma   . Emphysema of lung   . Cough   . Wheezing   . Diabetes mellitus     no rx for 3-4 yrs  lost weight; "diet controlled" (05/03/2012)  . CHF (congestive heart failure)      Family History  Problem Relation Age of Onset  . Colon cancer Neg Hx   . Cancer Mother     bone     History   Social History  . Marital Status: Married    Spouse Name: N/A    Number of Children: N/A  . Years of Education: N/A   Social History Main Topics  . Smoking status: Former Smoker -- 0.50 packs/day for 29 years    Types: Cigarettes   Quit date: 01/25/2013  . Smokeless tobacco: Never Used     Comment: 05/03/2012 Patient is currently using the Blue cigarettes to help stop smoking/using smokeless now  . Alcohol Use: No  . Drug Use: No  . Sexual Activity: Yes   Other Topics Concern  . None   Social History Narrative  . None     ROS: She is doing well from a hernia point of view. Her chest pain is pleuritic. She has not coughed up any blood.    Objective: Vital signs in last 24 hours: Temp:  [99.1 F (37.3 C)-103.8 F (39.9 C)] 99.3 F (37.4 C) (08/17 0700) Pulse Rate:  [81-154] 86 (08/17 0700) Resp:  [12-28] 18 (08/17 0700) BP: (72-121)/(45-66) 92/52 mmHg (08/17 0700) SpO2:  [93 %-99 %] 96 % (08/17 0700) FiO2 (%):  [50 %] 50 % (08/16 1432) Weight:  [112.5 kg (248 lb 0.3 oz)-119.6 kg (263 lb 10.7 oz)] 119.6 kg (263 lb 10.7 oz) (08/17 0500) Weight change:  Last BM Date: 04/09/13  Intake/Output from previous day: 08/16 0701 - 08/17 0700 In: 4130 [I.V.:4050; IV Piggyback:50] Out: 2725 [Urine:2700; Drains:25]  PHYSICAL EXAM She is awake and alert. She has some hoarseness which is chronic. Her pupils are reactive. Mucous hemorrhage are moist. Her neck is supple. Her chest shows significant crackles and rhonchi more on the right than on the left. Her heart is regular without gallop. Abdomen is soft and she does not have any significant edema of the extremities. Central nervous system exam is grossly intact  Lab Results: Basic Metabolic Panel:  Recent Labs  62/95/28 0801 04/14/13 0500  NA 137 137  K 3.9 3.9  CL 101 104  CO2 29 30  GLUCOSE 165* 100*  BUN 10 7  CREATININE 0.55 0.57  CALCIUM 9.4 8.0*   Liver Function Tests:  Recent Labs  04/13/13 0801 04/14/13 0500  AST 17 11  ALT 14 12  ALKPHOS 81 66  BILITOT 0.5 0.3  PROT 6.5 5.1*  ALBUMIN 3.0* 2.1*    Recent Labs  04/13/13 0801  LIPASE 28   No results found for this basename: AMMONIA,  in the last 72 hours CBC:  Recent Labs   04/13/13 0744 04/14/13 0500  WBC 12.2* 13.0*  NEUTROABS 10.9*  --   HGB 14.9 11.3*  HCT 46.4* 36.4  MCV 92.1 94.8  PLT 290 260   Cardiac Enzymes: No results found for this basename: CKTOTAL, CKMB, CKMBINDEX, TROPONINI,  in the last 72 hours BNP:  Recent Labs  04/13/13 0801  PROBNP 233.1*   D-Dimer: No results found for this basename: DDIMER,  in the last 72 hours CBG: No results found for this basename: GLUCAP,  in the last 72 hours Hemoglobin A1C: No results found for this basename: HGBA1C,  in the last 72 hours Fasting Lipid Panel: No results found for this basename: CHOL, HDL, LDLCALC, TRIG, CHOLHDL, LDLDIRECT,  in the last 72 hours Thyroid Function Tests: No results found for this basename: TSH, T4TOTAL, FREET4, T3FREE, THYROIDAB,  in the last 72 hours Anemia Panel: No results found for this basename: VITAMINB12, FOLATE, FERRITIN, TIBC, IRON, RETICCTPCT,  in the last 72 hours Coagulation: No results found for this basename: LABPROT, INR,  in the last 72 hours Urine Drug Screen: Drugs of Abuse     Component Value Date/Time   LABOPIA NONE DETECTED 03/08/2009 1553   COCAINSCRNUR NONE DETECTED 03/08/2009 1553   LABBENZ POSITIVE* 03/08/2009 1553   AMPHETMU NONE DETECTED 03/08/2009 1553   THCU NONE DETECTED 03/08/2009 1553   LABBARB  Value: NONE DETECTED        DRUG SCREEN FOR MEDICAL PURPOSES ONLY.  IF CONFIRMATION IS NEEDED FOR ANY PURPOSE, NOTIFY LAB WITHIN 5 DAYS.        LOWEST DETECTABLE LIMITS FOR URINE DRUG SCREEN Drug Class       Cutoff (ng/mL) Amphetamine      1000 Barbiturate      200 Benzodiazepine   200 Tricyclics       300 Opiates          300 Cocaine          300 THC              50 03/08/2009 1553    Alcohol Level: No results found for this basename: ETH,  in the last 72 hours Urinalysis:  Recent Labs  04/13/13 0816  COLORURINE YELLOW  LABSPEC <1.005*  PHURINE 6.0  GLUCOSEU NEGATIVE  HGBUR NEGATIVE  BILIRUBINUR NEGATIVE  KETONESUR NEGATIVE  PROTEINUR  NEGATIVE  UROBILINOGEN 0.2  NITRITE NEGATIVE  LEUKOCYTESUR NEGATIVE   Misc. Labs:   ABGS:  Recent Labs  04/13/13 0825  PHART 7.373  PO2ART 83.5  TCO2 24.7  HCO3 27.7*     MICROBIOLOGY: Recent Results (from the past 240 hour(s))  CULTURE, BLOOD (ROUTINE X 2)     Status: None   Collection Time    04/13/13  7:41 AM      Result Value Range Status   Specimen Description Blood   Final   Special Requests NONE   Final   Culture NO GROWTH <24 HRS   Final   Report Status PENDING   Incomplete  CULTURE, BLOOD (ROUTINE X 2)     Status: None   Collection Time    04/13/13  8:01 AM      Result Value Range Status   Specimen Description Blood   Final   Special Requests NONE   Final   Culture NO GROWTH <24 HRS   Final   Report Status PENDING   Incomplete  MRSA PCR SCREENING     Status: None   Collection Time    04/13/13  2:16 PM      Result Value Range Status   MRSA by PCR NEGATIVE  NEGATIVE Final   Comment:            The GeneXpert MRSA Assay (FDA     approved for NASAL specimens     only), is one component of a     comprehensive MRSA colonization     surveillance program. It is not     intended to diagnose MRSA     infection nor to guide or     monitor treatment for     MRSA infections.    Studies/Results: Dg Chest Portable 1 View  04/13/2013   *RADIOLOGY REPORT*  Clinical Data: Fever.  Recent hernia surgery.  PORTABLE CHEST - 1 VIEW  Comparison: 04/01/2013  Findings: There is extensive airspace disease throughout a large portion of the right lung.  Prominent interstitial markings on the left could represent volume loss and edema.  Heart size is grossly normal.  IMPRESSION: Extensive airspace disease in the right lung is concerning for pneumonia. There may also be a component of pulmonary edema.   Original Report Authenticated By: Richarda Overlie, M.D.    Medications:  Prior to Admission:  Prescriptions prior to admission  Medication Sig Dispense Refill  . Aclidinium Bromide  (TUDORZA PRESSAIR) 400 MCG/ACT AEPB Inhale 1 puff into the lungs 2 (two) times daily.      Marland Kitchen albuterol (PROVENTIL HFA;VENTOLIN HFA) 108 (90 BASE) MCG/ACT inhaler Inhale 2 puffs into the lungs every 6 (six) hours as needed. For RESCUE.      Marland Kitchen aspirin EC 81 MG tablet Take 81 mg by mouth daily.      . clonazePAM (KLONOPIN) 2 MG tablet Take 2 mg by mouth 4 (four) times daily as needed for anxiety. For anxiety      . cyclobenzaprine (FLEXERIL) 10 MG tablet Take 10 mg by mouth 3 (three) times daily as needed for muscle spasms.      Marland Kitchen docusate sodium (COLACE) 100 MG capsule Take 100 mg by mouth daily as needed for constipation.      Marland Kitchen HYDROcodone-acetaminophen (NORCO) 10-325 MG per tablet Take 1-2 tablets by mouth every 6 (six) hours as needed for pain.       Marland Kitchen ipratropium-albuterol (DUONEB) 0.5-2.5 (3) MG/3ML SOLN Take 3 mLs by nebulization every 4 (four) hours as needed (shortness of breath).      Marland Kitchen lithium carbonate (LITHOBID) 300 MG  CR tablet Take 600 mg by mouth 2 (two) times daily.      Marland Kitchen olmesartan-hydrochlorothiazide (BENICAR HCT) 40-25 MG per tablet Take 1 tablet by mouth daily.      Marland Kitchen oxymorphone (OPANA ER) 40 MG 12 hr tablet Take 40 mg by mouth every 12 (twelve) hours.      . pantoprazole (PROTONIX) 40 MG tablet Take 40 mg by mouth daily as needed (for reflux).      Marland Kitchen senna-docusate (SENOKOT-S) 8.6-50 MG per tablet Take 1 tablet by mouth daily as needed for constipation.       Scheduled: . aspirin EC  81 mg Oral Daily  . ceFEPime (MAXIPIME) IV  1 g Intravenous Q8H  . guaiFENesin  600 mg Oral BID  . heparin  5,000 Units Subcutaneous Q8H  . hydrocortisone   Topical TID  . lithium carbonate  600 mg Oral BID  . methylPREDNISolone (SOLU-MEDROL) injection  125 mg Intravenous Q6H  . morphine  60 mg Oral Q12H  . tiotropium  18 mcg Inhalation Daily  . vancomycin  1,000 mg Intravenous Q8H   Continuous: . sodium chloride 1,000 mL (04/13/13 1900)   ZOX:[id number], clonazePAM,  HYDROcodone-acetaminophen, ipratropium, pantoprazole, senna-docusate  Assesment: She has healthcare associated pneumonia. She has some element of respiratory failure as her PCO2 is elevated. This is mostly chronic. She has severe COPD. She has stopped smoking. She tells me she does not feel like Carlos American was as effective for her as Spiriva. She still feels short of breath and is wheezing at intervals Active Problems:   DIABETES MELLITUS   HYPERTENSION   BIPOLAR AFFECTIVE DISORDER, HX OF   Tobacco abuse   HCAP (healthcare-associated pneumonia)    Plan: I will switch her from current inhaler back to Spiriva. I added steroids because of her COPD. Continue with all of her other treatments.    LOS: 1 day   Niara Bunker L 04/14/2013, 7:39 AM

## 2013-04-14 NOTE — Progress Notes (Signed)
[facility] PROGRESS NOTE  Brenda Frank EAV:[id number] DOB: 1971/02/03 DOA: 04/13/2013 PCP: Catalina Pizza, MD  Assessment/Plan: 1. Acute respiratory failure due to HCAP.  Patient is on broad spectrum antibiotics.  Fevers are improving.  Clinically she appears to be doing better.  Follow up cultures. Wean down oxygen as tolerated. 2. Sepsis.  Improving with iv fluids and antibiotics. Blood pressure has remained low overnight.  Will monitor in SDU for another day.  OOB to chair. 3. Copd.  Appreciate pulmonology assistance.  Started on steroids, also on antibiotics and nebs 4. History of bipolar affective disorder.  Continuing outpatient meds. 5. Recent ventral hernia repair.  Follow up with gen surgery as an outpatient. 6. Erythematous rash on upper back.  Unclear etiology, has been present since last hospitalization earlier this week.  ?contact dermatitis.  Will continue to follow.  Code Status: full code Family Communication: discussed with patient and family at bedside Disposition Plan: discharge home once improved   Consultants:  Pulmonology, Dr. Juanetta Gosling  Procedures:  none  Antibiotics:  Vancomycin 8/16  Cefepime 8/16  HPI/Subjective: Patient is feeling better today.  She is more coherent.  Feels breathing is improving.  Coughing.  Pain in abdomen with coughing.  Objective: Filed Vitals:   04/14/13 0700  BP: 92/52  Pulse: 86  Temp: 99.3 F (37.4 C)  Resp: 18    Intake/Output Summary (Last 24 hours) at 04/14/13 0917 Last data filed at 04/14/13 0656  Gross per 24 hour  Intake   5420 ml  Output   2725 ml  Net   2695 ml   Filed Weights   04/13/13 0723 04/13/13 1055 04/14/13 0500  Weight: 111.131 kg (245 lb) 112.5 kg (248 lb 0.3 oz) 119.6 kg (263 lb 10.7 oz)    Exam:   General:  NAD  Cardiovascular: S1, S2 RRR  Respiratory: crackles at bases  b/l  Abdomen: soft, tender on left side, nd, bs, drain in place without any signs of  infection.  Musculoskeletal: no edema b/l   Data Reviewed: Basic Metabolic Panel:  Recent Labs Lab 04/09/13 1130 04/10/13 0615 04/13/13 0801 04/14/13 0500  NA  --  137 137 137  K  --  4.2 3.9 3.9  CL  --  98 101 104  CO2  --  34* 29 30  GLUCOSE  --  112* 165* 100*  BUN  --  9 10 7   CREATININE 0.73 0.66 0.55 0.57  CALCIUM  --  9.0 9.4 8.0*   Liver Function Tests:  Recent Labs Lab 04/13/13 0801 04/14/13 0500  AST 17 11  ALT 14 12  ALKPHOS 81 66  BILITOT 0.5 0.3  PROT 6.5 5.1*  ALBUMIN 3.0* 2.1*    Recent Labs Lab 04/13/13 0801  LIPASE 28   No results found for this basename: AMMONIA,  in the last 168 hours CBC:  Recent Labs Lab 04/09/13 1130 04/10/13 0615 04/13/13 0744 04/14/13 0500  WBC 14.8* 10.1 12.2* 13.0*  NEUTROABS  --   --  10.9*  --   HGB 15.9* 15.4* 14.9 11.3*  HCT 47.3* 46.7* 46.4* 36.4  MCV 90.1 91.2 92.1 94.8  PLT 338 306 290 260   Cardiac Enzymes: No results found for this basename: CKTOTAL, CKMB, CKMBINDEX, TROPONINI,  in the last 168 hours BNP (last 3 results)  Recent Labs  04/13/13 0801  PROBNP 233.1*   CBG:  Recent Labs Lab 04/09/13 0620 04/09/13 0918  GLUCAP 119* 112*    Recent Results (from  the past 240 hour(s))  CULTURE, BLOOD (ROUTINE X 2)     Status: None   Collection Time    04/13/13  7:41 AM      Result Value Range Status   Specimen Description Blood   Final   Special Requests NONE   Final   Culture NO GROWTH <24 HRS   Final   Report Status PENDING   Incomplete  CULTURE, BLOOD (ROUTINE X 2)     Status: None   Collection Time    04/13/13  8:01 AM      Result Value Range Status   Specimen Description Blood   Final   Special Requests NONE   Final   Culture NO GROWTH <24 HRS   Final   Report Status PENDING   Incomplete  MRSA PCR SCREENING     Status: None   Collection Time    04/13/13  2:16 PM      Result Value Range Status   MRSA by PCR NEGATIVE  NEGATIVE Final   Comment:            The GeneXpert MRSA  Assay (FDA     approved for NASAL specimens     only), is one component of a     comprehensive MRSA colonization     surveillance program. It is not     intended to diagnose MRSA     infection nor to guide or     monitor treatment for     MRSA infections.     Studies: Dg Chest Portable 1 View  04/13/2013   *RADIOLOGY REPORT*  Clinical Data: Fever.  Recent hernia surgery.  PORTABLE CHEST - 1 VIEW  Comparison: 04/01/2013  Findings: There is extensive airspace disease throughout a large portion of the right lung.  Prominent interstitial markings on the left could represent volume loss and edema.  Heart size is grossly normal.  IMPRESSION: Extensive airspace disease in the right lung is concerning for pneumonia. There may also be a component of pulmonary edema.   Original Report Authenticated By: Richarda Overlie, M.D.    Scheduled Meds: . albuterol  2.5 mg Nebulization Q4H   And  . ipratropium  0.5 mg Nebulization Q4H  . aspirin EC  81 mg Oral Daily  . ceFEPime (MAXIPIME) IV  1 g Intravenous Q8H  . guaiFENesin  600 mg Oral BID  . heparin  5,000 Units Subcutaneous Q8H  . hydrocortisone   Topical TID  . lithium carbonate  600 mg Oral BID  . methylPREDNISolone (SOLU-MEDROL) injection  125 mg Intravenous Q6H  . morphine  60 mg Oral Q12H  . vancomycin  1,000 mg Intravenous Q8H   Continuous Infusions: . sodium chloride 1,000 mL (04/13/13 1900)    Active Problems:   DIABETES MELLITUS   HYPERTENSION   BIPOLAR AFFECTIVE DISORDER, HX OF   Tobacco abuse   HCAP (healthcare-associated pneumonia)    Time spent:    MEMON,JEHANZEB  [facility] Pager [phone]. If 7PM-7AM, please contact night-coverage at [email or web address], password [facility] 04/14/2013, 9:17 AM  LOS: 1 day

## 2013-04-14 NOTE — Progress Notes (Signed)
Dr. Juanetta Gosling: Patient would like to use the spiriva inhaler instead of atrovent nebulizer.  She would like to discuss this with you when you see her again.

## 2013-04-15 DIAGNOSIS — I517 Cardiomegaly: Secondary | ICD-10-CM

## 2013-04-15 LAB — BASIC METABOLIC PANEL
CO2: 26 mEq/L (ref 19–32)
Calcium: 9 mg/dL (ref 8.4–10.5)
GFR calc non Af Amer: >90 mL/min (ref >90)
Sodium: 145 mEq/L (ref 135–145)

## 2013-04-15 LAB — LEGIONELLA ANTIGEN, URINE: Legionella Antigen, Urine: NEGATIVE

## 2013-04-15 LAB — CBC
MCH: 29.3 pg (ref 26.0–34.0)
MCHC: 31.2 g/dL (ref 30.0–36.0)
Platelets: 337 10*3/uL (ref 150–400)
RBC: 3.99 MIL/uL (ref 3.87–5.11)

## 2013-04-15 LAB — GLUCOSE, CAPILLARY
Glucose-Capillary: 150 mg/dL — ABNORMAL HIGH (ref 70–99)
Glucose-Capillary: 173 mg/dL — ABNORMAL HIGH (ref 70–99)

## 2013-04-15 LAB — VANCOMYCIN, TROUGH: Vancomycin Tr: 16.4 ug/mL (ref 10.0–20.0)

## 2013-04-15 MED ORDER — BISACODYL 5 MG PO TBEC
15.0000 mg | DELAYED_RELEASE_TABLET | Freq: Every day | ORAL | Status: DC | PRN
  Administered 2013-04-15 – 2013-04-16 (×2): 15 mg via ORAL
  Filled 2013-04-15 (×2): qty 3

## 2013-04-15 MED ORDER — SODIUM CHLORIDE 0.9 % IV SOLN
1000.0000 mL | INTRAVENOUS | Status: DC
  Administered 2013-04-15 – 2013-04-16 (×3): 1000 mL via INTRAVENOUS

## 2013-04-15 MED ORDER — SENNOSIDES-DOCUSATE SODIUM 8.6-50 MG PO TABS: 2.0000 | ORAL_TABLET | Freq: Every day | ORAL | Status: DC | PRN

## 2013-04-15 NOTE — Progress Notes (Signed)
UR chart review completed.  

## 2013-04-15 NOTE — Progress Notes (Signed)
*  PRELIMINARY RESULTS* Echocardiogram 2D Echocardiogram has been performed.  Conrad Aberdeen 04/15/2013, 4:08 PM

## 2013-04-15 NOTE — Progress Notes (Signed)
Subjective: She has healthcare associated pneumonia. She is still coughing and still somewhat short of breath but feels better. She's had some episodes of bradycardia with sinus arrhythmia. She is not on any medications that should cause that.  Objective: Vital signs in last 24 hours: Temp:  [97.3 F (36.3 C)-99.1 F (37.3 C)] 97.3 F (36.3 C) (08/18 0741) Pulse Rate:  [69-98] 69 (08/18 0600) Resp:  [11-25] 15 (08/18 0600) BP: (88-125)/(45-110) 125/110 mmHg (08/18 0600) SpO2:  [90 %-99 %] 95 % (08/18 0707) Weight:  [122.9 kg (270 lb 15.1 oz)] 122.9 kg (270 lb 15.1 oz) (08/18 0649) Weight change: 11.769 kg (25 lb 15.1 oz) Last BM Date: 04/09/13  Intake/Output from previous day: 08/17 0701 - 08/18 0700 In: 5560 [P.O.:1550; I.V.:3460; IV Piggyback:550] Out: 3845 [Urine:3845]  PHYSICAL EXAM General appearance: alert, cooperative and mild distress Resp: rhonchi RLL, RML and RUL Cardio: regular rate and rhythm, S1, S2 normal, no murmur, click, rub or gallop GI: soft, non-tender; bowel sounds normal; no masses,  no organomegaly Extremities: extremities normal, atraumatic, no cyanosis or edema  Lab Results:    Basic Metabolic Panel:  Recent Labs  08/65/78 0500 04/15/13 0508  NA 137 145  K 3.9 3.9  CL 104 111  CO2 30 26  GLUCOSE 100* 162*  BUN 7 9  CREATININE 0.57 0.45*  CALCIUM 8.0* 9.0   Liver Function Tests:  Recent Labs  04/13/13 0801 04/14/13 0500  AST 17 11  ALT 14 12  ALKPHOS 81 66  BILITOT 0.5 0.3  PROT 6.5 5.1*  ALBUMIN 3.0* 2.1*    Recent Labs  04/13/13 0801  LIPASE 28   No results found for this basename: AMMONIA,  in the last 72 hours CBC:  Recent Labs  04/13/13 0744 04/14/13 0500 04/15/13 0508  WBC 12.2* 13.0* 13.6*  NEUTROABS 10.9*  --   --   HGB 14.9 11.3* 11.7*  HCT 46.4* 36.4 37.5  MCV 92.1 94.8 94.0  PLT 290 260 337   Cardiac Enzymes: No results found for this basename: CKTOTAL, CKMB, CKMBINDEX, TROPONINI,  in the last 72  hours BNP:  Recent Labs  04/13/13 0801  PROBNP 233.1*   D-Dimer: No results found for this basename: DDIMER,  in the last 72 hours CBG:  Recent Labs  04/14/13 2122 04/15/13 0717  GLUCAP 203* 134*   Hemoglobin A1C: No results found for this basename: HGBA1C,  in the last 72 hours Fasting Lipid Panel: No results found for this basename: CHOL, HDL, LDLCALC, TRIG, CHOLHDL, LDLDIRECT,  in the last 72 hours Thyroid Function Tests: No results found for this basename: TSH, T4TOTAL, FREET4, T3FREE, THYROIDAB,  in the last 72 hours Anemia Panel: No results found for this basename: VITAMINB12, FOLATE, FERRITIN, TIBC, IRON, RETICCTPCT,  in the last 72 hours Coagulation: No results found for this basename: LABPROT, INR,  in the last 72 hours Urine Drug Screen: Drugs of Abuse     Component Value Date/Time   LABOPIA NONE DETECTED 03/08/2009 1553   COCAINSCRNUR NONE DETECTED 03/08/2009 1553   LABBENZ POSITIVE* 03/08/2009 1553   AMPHETMU NONE DETECTED 03/08/2009 1553   THCU NONE DETECTED 03/08/2009 1553   LABBARB  Value: NONE DETECTED        DRUG SCREEN FOR MEDICAL PURPOSES ONLY.  IF CONFIRMATION IS NEEDED FOR ANY PURPOSE, NOTIFY LAB WITHIN 5 DAYS.        LOWEST DETECTABLE LIMITS FOR URINE DRUG SCREEN Drug Class       Cutoff (ng/mL) Amphetamine  1000 Barbiturate      200 Benzodiazepine   200 Tricyclics       300 Opiates          300 Cocaine          300 THC              50 03/08/2009 1553    Alcohol Level: No results found for this basename: ETH,  in the last 72 hours Urinalysis:  Recent Labs  04/13/13 0816  COLORURINE YELLOW  LABSPEC <1.005*  PHURINE 6.0  GLUCOSEU NEGATIVE  HGBUR NEGATIVE  BILIRUBINUR NEGATIVE  KETONESUR NEGATIVE  PROTEINUR NEGATIVE  UROBILINOGEN 0.2  NITRITE NEGATIVE  LEUKOCYTESUR NEGATIVE   Misc. Labs:  ABGS  Recent Labs  04/13/13 0825  PHART 7.373  PO2ART 83.5  TCO2 24.7  HCO3 27.7*   CULTURES Recent Results (from the past 240 hour(s))   CULTURE, BLOOD (ROUTINE X 2)     Status: None   Collection Time    04/13/13  7:41 AM      Result Value Range Status   Specimen Description Blood   Final   Special Requests NONE   Final   Culture NO GROWTH 1 DAY   Final   Report Status PENDING   Incomplete  CULTURE, BLOOD (ROUTINE X 2)     Status: None   Collection Time    04/13/13  8:01 AM      Result Value Range Status   Specimen Description Blood   Final   Special Requests NONE   Final   Culture NO GROWTH 1 DAY   Final   Report Status PENDING   Incomplete  URINE CULTURE     Status: None   Collection Time    04/13/13  8:16 AM      Result Value Range Status   Specimen Description URINE, CATHETERIZED   Final   Special Requests NONE   Final   Culture  Setup Time     Final   Value: 04/13/2013 21:21     Performed at [facility] Count     Final   Value: NO GROWTH     Performed at [facility]   Culture     Final   Value: NO GROWTH     Performed at [facility]   Report Status 04/14/2013 FINAL   Final  MRSA PCR SCREENING     Status: None   Collection Time    04/13/13  2:16 PM      Result Value Range Status   MRSA by PCR NEGATIVE  NEGATIVE Final   Comment:            The GeneXpert MRSA Assay (FDA     approved for NASAL specimens     only), is one component of a     comprehensive MRSA colonization     surveillance program. It is not     intended to diagnose MRSA     infection nor to guide or     monitor treatment for     MRSA infections.  CULTURE, EXPECTORATED SPUTUM-ASSESSMENT     Status: None   Collection Time    04/14/13  7:28 AM      Result Value Range Status   Specimen Description SPUTUM   Final   Special Requests Normal   Final   Sputum evaluation     Final   Value: THIS SPECIMEN IS ACCEPTABLE. RESPIRATORY CULTURE REPORT TO FOLLOW.  Report Status 04/14/2013 FINAL   Final   Studies/Results: No results found.  Medications:  Scheduled: . albuterol  2.5 mg Nebulization Q4H    And  . ipratropium  0.5 mg Nebulization Q4H  . aspirin EC  81 mg Oral Daily  . ceFEPime (MAXIPIME) IV  1 g Intravenous Q8H  . guaiFENesin  600 mg Oral BID  . heparin  5,000 Units Subcutaneous Q8H  . hydrocortisone   Topical TID  . lithium carbonate  600 mg Oral BID  . methylPREDNISolone (SOLU-MEDROL) injection  125 mg Intravenous Q6H  . morphine  60 mg Oral Q12H  . vancomycin  1,000 mg Intravenous Q8H   Continuous: . sodium chloride 1,000 mL (04/15/13 0600)   WUJ:[id number], diphenhydrAMINE, HYDROcodone-acetaminophen, pantoprazole, senna-docusate  Assesment: She has healthcare associated pneumonia. She has COPD. She had discussed Spiriva with the respiratory therapist but says she would prefer to continue on Atrovent while in the hospital by nebulizer. I think that is appropriate and when she's better we can switch her to Spiriva. It is not clear to me why she is having bradycardia. Active Problems:   DIABETES MELLITUS   HYPERTENSION   BIPOLAR AFFECTIVE DISORDER, HX OF   Tobacco abuse   HCAP (healthcare-associated pneumonia)   Sepsis   COPD (chronic obstructive pulmonary disease)    Plan: No change in medications. Continue current antibiotics etc.    LOS: 2 days   Ardith Lewman L 04/15/2013, 8:59 AM

## 2013-04-15 NOTE — Progress Notes (Signed)
Inpatient Diabetes Program Recommendations  AACE/ADA: New Consensus Statement on Inpatient Glycemic Control (2013)  Target Ranges:  Prepandial:   less than 140 mg/dL      Peak postprandial:   less than 180 mg/dL (1-2 hours)      Critically ill patients:  140 - 180 mg/dL   Results for Brenda Frank, Brenda Frank (MRN [id number]) as of 04/15/2013 08:33  Ref. Range 04/14/2013 21:22 04/15/2013 07:17  Glucose-Capillary Latest Range: 70-99 mg/dL 409 (H) 811 (H)    Inpatient Diabetes Program Recommendations Correction (SSI): Please consider ordering CBGs ACHS with Novolog correction scale if needed while inpatient and on steroids. HgbA1C: Please consider ordering an A1C to determine glycemic control over the past 2-3 months. Diet: Please consider changing diet to carb modified diet.  Note: Patient has a history of diabetes which is diet controlled as an outpatient.  Currently as an inpatient, there are not any order for glycemic control.  Patient is ordered to receive Solumedrol 125 mg IV Q6H which is likely contributing to hyperglycemia.  Please consider ordering CBGs ACHS with Novolog correction scale, ordering an A1C, and changing diet to Carb Modified Diabetic diet.  Will continue to follow as an inpatient.  Thanks, Orlando Penner, RN, MSN, CCRN Diabetes Coordinator Inpatient Diabetes Program [phone]

## 2013-04-15 NOTE — Progress Notes (Signed)
[facility] PROGRESS NOTE  Brenda Frank UJW:[id number] DOB: [birth year] DOA: 04/13/2013 PCP: Catalina Pizza, MD  Assessment/Plan: 1. Acute respiratory failure due to HCAP.  Patient is on broad spectrum antibiotics. Afebrile.  Clinically she appears to be doing better.  Follow up cultures. Wean down oxygen as tolerated. 2. Sepsis.  Improving with iv fluids and antibiotics. Blood pressure has remained low overnight.  Will monitor in SDU for another day.  OOB to chair. Physical therapy consult.  Discontinue foley catheter. 3. Copd.  Appreciate pulmonology assistance.  On steroids, antibiotics and nebs 4. History of bipolar affective disorder.  Continuing outpatient meds. 5. Recent ventral hernia repair.  Follow up with gen surgery as an outpatient. 6. Erythematous rash on upper back.  Unclear etiology, has been present since last hospitalization earlier this week.  ?contact dermatitis.  Appears to be improving. Will continue to follow. 7. Bradycardia with sinus arrythmia. Unclear etiology.  Appears to have resolved.  Patient was asymptomatic. Will check TSH, electrolytes and echo.  If it recurs, may need to get cardiology involved.  Code Status: full code Family Communication: discussed with patient and family at bedside Disposition Plan: discharge home once improved, physical therapy consult, anticipate discharge in the next 1-2 days   Consultants:  Pulmonology, Dr. Juanetta Gosling  Procedures:  none  Antibiotics:  Vancomycin 8/16  Cefepime 8/16  HPI/Subjective: Feeling better.  Feels weak.  Had episode of bradycardia this morning.  Remained asymptomatic through episode.   Objective: Filed Vitals:   04/15/13 0741  BP:   Pulse:   Temp: 97.3 F (36.3 C)  Resp:     Intake/Output Summary (Last 24 hours) at 04/15/13 1031 Last data filed at 04/15/13 0649  Gross per 24 hour  Intake   5210 ml  Output   3845 ml  Net   1365 ml   Filed Weights   04/13/13 1055 04/14/13 0500 04/15/13  0649  Weight: 112.5 kg (248 lb 0.3 oz) 119.6 kg (263 lb 10.7 oz) 122.9 kg (270 lb 15.1 oz)    Exam:   General:  NAD  Cardiovascular: S1, S2 RRR  Respiratory: crackles at right base  Abdomen: soft, tender on left side, nd, bs, drain in place without any signs of infection.  Musculoskeletal: no edema b/l   Data Reviewed: Basic Metabolic Panel:  Recent Labs Lab 04/09/13 1130 04/10/13 0615 04/13/13 0801 04/14/13 0500 04/15/13 0508  NA  --  137 137 137 145  K  --  4.2 3.9 3.9 3.9  CL  --  98 101 104 111  CO2  --  34* 29 30 26   GLUCOSE  --  112* 165* 100* 162*  BUN  --  9 10 7 9   CREATININE 0.73 0.66 0.55 0.57 0.45*  CALCIUM  --  9.0 9.4 8.0* 9.0   Liver Function Tests:  Recent Labs Lab 04/13/13 0801 04/14/13 0500  AST 17 11  ALT 14 12  ALKPHOS 81 66  BILITOT 0.5 0.3  PROT 6.5 5.1*  ALBUMIN 3.0* 2.1*    Recent Labs Lab 04/13/13 0801  LIPASE 28   No results found for this basename: AMMONIA,  in the last 168 hours CBC:  Recent Labs Lab 04/09/13 1130 04/10/13 0615 04/13/13 0744 04/14/13 0500 04/15/13 0508  WBC 14.8* 10.1 12.2* 13.0* 13.6*  NEUTROABS  --   --  10.9*  --   --   HGB 15.9* 15.4* 14.9 11.3* 11.7*  HCT 47.3* 46.7* 46.4* 36.4 37.5  MCV 90.1 91.2 92.1  94.8 94.0  PLT 338 306 290 260 337   Cardiac Enzymes: No results found for this basename: CKTOTAL, CKMB, CKMBINDEX, TROPONINI,  in the last 168 hours BNP (last 3 results)  Recent Labs  04/13/13 0801  PROBNP 233.1*   CBG:  Recent Labs Lab 04/09/13 0620 04/09/13 0918 04/14/13 2122 04/15/13 0717  GLUCAP 119* 112* 203* 134*    Recent Results (from the past 240 hour(s))  CULTURE, BLOOD (ROUTINE X 2)     Status: None   Collection Time    04/13/13  7:41 AM      Result Value Range Status   Specimen Description Blood   Final   Special Requests NONE   Final   Culture NO GROWTH 1 DAY   Final   Report Status PENDING   Incomplete  CULTURE, BLOOD (ROUTINE X 2)     Status: None    Collection Time    04/13/13  8:01 AM      Result Value Range Status   Specimen Description Blood   Final   Special Requests NONE   Final   Culture NO GROWTH 1 DAY   Final   Report Status PENDING   Incomplete  URINE CULTURE     Status: None   Collection Time    04/13/13  8:16 AM      Result Value Range Status   Specimen Description URINE, CATHETERIZED   Final   Special Requests NONE   Final   Culture  Setup Time     Final   Value: 04/13/2013 21:21     Performed at [facility] Count     Final   Value: NO GROWTH     Performed at [facility]   Culture     Final   Value: NO GROWTH     Performed at [facility]   Report Status 04/14/2013 FINAL   Final  MRSA PCR SCREENING     Status: None   Collection Time    04/13/13  2:16 PM      Result Value Range Status   MRSA by PCR NEGATIVE  NEGATIVE Final   Comment:            The GeneXpert MRSA Assay (FDA     approved for NASAL specimens     only), is one component of a     comprehensive MRSA colonization     surveillance program. It is not     intended to diagnose MRSA     infection nor to guide or     monitor treatment for     MRSA infections.  CULTURE, EXPECTORATED SPUTUM-ASSESSMENT     Status: None   Collection Time    04/14/13  7:28 AM      Result Value Range Status   Specimen Description SPUTUM   Final   Special Requests Normal   Final   Sputum evaluation     Final   Value: THIS SPECIMEN IS ACCEPTABLE. RESPIRATORY CULTURE REPORT TO FOLLOW.   Report Status 04/14/2013 FINAL   Final  CULTURE, RESPIRATORY (NON-EXPECTORATED)     Status: None   Collection Time    04/14/13  7:28 AM      Result Value Range Status   Specimen Description SPUTUM   Final   Special Requests NONE   Final   Gram Stain PENDING   Incomplete   Culture     Final   Value: Culture reincubated for better growth  Performed at [facility]   Report Status PENDING   Incomplete     Studies: No results  found.  Scheduled Meds: . albuterol  2.5 mg Nebulization Q4H   And  . ipratropium  0.5 mg Nebulization Q4H  . aspirin EC  81 mg Oral Daily  . ceFEPime (MAXIPIME) IV  1 g Intravenous Q8H  . guaiFENesin  600 mg Oral BID  . heparin  5,000 Units Subcutaneous Q8H  . hydrocortisone   Topical TID  . lithium carbonate  600 mg Oral BID  . methylPREDNISolone (SOLU-MEDROL) injection  125 mg Intravenous Q6H  . morphine  60 mg Oral Q12H  . vancomycin  1,000 mg Intravenous Q8H   Continuous Infusions: . sodium chloride 1,000 mL (04/15/13 0600)    Active Problems:   DIABETES MELLITUS   HYPERTENSION   BIPOLAR AFFECTIVE DISORDER, HX OF   Tobacco abuse   HCAP (healthcare-associated pneumonia)   Sepsis   COPD (chronic obstructive pulmonary disease)    Time spent:    MEMON,JEHANZEB  [facility] Pager [phone]. If 7PM-7AM, please contact night-coverage at [email or web address], password [facility] 04/15/2013, 10:31 AM  LOS: 2 days

## 2013-04-15 NOTE — Progress Notes (Signed)
Text paged the Dr. Kerry Hough the pt c/o CP that she rated as 4-5.  She describes it as squeezing/aching that lasted 3-4 seconds. I will continue to monitor her.

## 2013-04-15 NOTE — Care Management Note (Signed)
    Page 1 of 2   04/17/2013     11:00:10 AM   CARE MANAGEMENT NOTE 04/17/2013  Patient:  Brenda Frank, Brenda Frank   Account Number:  [id number]  Date Initiated:  04/15/2013  Documentation initiated by:  Sharrie Rothman  Subjective/Objective Assessment:   Pt admitted from home with pneumonia. Pt lives with her wife and will return home at discharge. Pt has cane and walker for home use. Pt will need HH PT at discharge.     Action/Plan:   Pt chose [facility] for HH PT. Alroy Bailiff of [facility] is aware and will collect the pts information from the chart. No othe rCM needs noted.   Anticipated DC Date:  04/18/2013   Anticipated DC Plan:  HOME W HOME HEALTH SERVICES      DC Planning Services  CM consult      [facility] Choice  HOME HEALTH   Choice offered to / List presented to:  C-1 Patient   DME arranged  [facility]  WHEELCHAIR - MANUAL      DME agency   [facility]     HH arranged  HH-2 PT      HH agency  [facility]   Status of service:  Completed, signed off Medicare Important Message given?   (If response is "NO", the following Medicare IM given date fields will be blank) Date Medicare IM given:   Date Additional Medicare IM given:    Discharge Disposition:  HOME W HOME HEALTH SERVICES  Per UR Regulation:    If discussed at Long Length of Stay Meetings, dates discussed:    Comments:  04/17/13 1100 Arlyss Queen, RN BSN CM Pt discharge dhome today with [facility] PT. Alroy Bailiff of [facility] is aware and will collect the pts information from the chart. Home O2 and wheelchair ordered from [employer]. Portable o2 to be delivered to pts room prior to discharge and w/c and concentrator will be delivered to pts home after discharge. HH services to start within 48 hours. No other CM needs noted. Pt and pts nurse aware of discharge arrangements.  04/15/13 1422 Arlyss Queen, RN BSN CM

## 2013-04-15 NOTE — Progress Notes (Signed)
[id number] Notified Dr. Kerry Hough of the patient having some pauses noted on the ICU monitor. She was also bradicardic as well.  According to the night nurse Consuello Masse, Rn she was asymptomatic.  The telementry strips and EKG was given to Dr. Kerry Hough.  I also discussed other concerns that was expressed by the patient such as with constipation and  wanting her foley out.  The MD is here on the floor to see the patient.  I will continue to monitor her and follow new orders per MD.

## 2013-04-15 NOTE — Evaluation (Signed)
Physical Therapy Evaluation Patient Details Name: Brenda Frank MRN: [id number] DOB: 29-May-1971 Today's Date: 04/15/2013 Time: 9147-8295 PT Time Calculation (min): 15 min  PT Assessment / Plan / Recommendation History of Present Illness   Pt is a 42 year old female admitted to [facility] for HCAP after her incisional hernia repair on 04/09/13 and s/p ALIF L2-5 05/10/12. PMH positiove of COPD, bipolar disorder, poldysythermia, HTN, DM.   Clinical Impression  Brenda Frank is referred to PT for general mobility.  At this time demonstrates need for care of supervision and min guard A for mobility.  Has weakness to LLE which she reports is common s/p ALIF.  Continues to have shaking to her Lt side of her leg and arm.  At this time she has become somewhat weakened overall since her hernia repair and now with HCAP and would recommend HHPT and encouraged to persue OP PT to improve her overall strength and balance for safe return to work activities.     PT Assessment  Patient needs continued PT services    Follow Up Recommendations  Home health PT;Supervision/Assistance - 24 hour    Does the patient have the potential to tolerate intense rehabilitation      Barriers to Discharge        Equipment Recommendations  None recommended by PT    Recommendations for Other Services     Frequency Min 3X/week    Precautions / Restrictions Precautions Precautions: Back   Pertinent Vitals/Pain Back pain FACES: 6/10      Mobility  Bed Mobility Bed Mobility: Rolling Right;Right Sidelying to Sit;Sitting - Scoot to Edge of Bed;Sit to Sidelying Right Rolling Right: 5: Supervision Right Sidelying to Sit: 5: Supervision;With rails;HOB elevated Sitting - Scoot to Edge of Bed: 5: Supervision Sit to Sidelying Right: 5: Supervision;With rail;HOB flat Details for Bed Mobility Assistance: log roll technique without cues Transfers Transfers: Sit to Stand;Stand to Sit Sit to Stand: 4: Min assist;From bed Stand to Sit:  4: Min guard;To bed Details for Transfer Assistance: caregiver in room and assisted to stand (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[employer]) Ambulation/Gait Ambulation/Gait Assistance: 4: Min assist Assistive device: None Gait Pattern: Step-through pattern;Shuffle;Trunk flexed    Exercises General Exercises - Lower Extremity Hip ABduction/ADduction: AROM;Left;10 reps;Sidelying Straight Leg Raises: AROM;Left;10 reps;Sidelying Toe Raises: AROM;Both;10 reps;Standing Heel Raises: AROM;Both;10 reps;Standing Mini-Sqauts: AROM;Both;10 reps;Standing   PT Diagnosis: Difficulty walking;Acute pain  PT Problem List: Decreased mobility;Pain;Decreased activity tolerance PT Treatment Interventions: DME instruction;Gait training;Stair training;Functional mobility training;Therapeutic exercise;Patient/family education     PT Goals(Current goals can be found in the care plan section)    Visit Information  Last PT Received On: 04/15/13 History of Present Illness:  Pt is a 42 year old female admitted to [facility] for HCAP after her incisional hernia repair on 11/1 and s/p ALIF L2-5. PMH positiove of COPD, bipolar disorder, poldysythermia, HTN, DM.        Prior Functioning  Home Living Family/patient expects to be discharged to:: Private residence Living Arrangements: Spouse/significant other;Children (13 year old son) Available Help at Discharge: Friend(s);Available 24 hours/day;Family;Available PRN/intermittently Type of Home: House Home Access: Stairs to enter [employer] of Steps: 3 Entrance Stairs-Rails: Left Home Layout: One level Home Equipment: Walker - 2 wheels;Shower seat;Bedside commode Additional Comments: Spouse is ICU Charity fundraiser at [employer].  She works as a Development worker, community Level of Independence: Independent Comments: walking Communication Communication: No difficulties    Cognition       Extremity/Trunk Assessment  Balance Balance Balance Assessed: Yes Static Standing  Balance Single Leg Stance - Right Leg: 5 Single Leg Stance - Left Leg: 0 Tandem Stance - Right Leg: 10 Tandem Stance - Left Leg: 10 Rhomberg - Eyes Opened: 30 Rhomberg - Eyes Closed: 10 High Level Balance High Level Balance Comments: Marching in place: min guard assist  End of Session PT - End of Session Equipment Utilized During Treatment: Gait belt Activity Tolerance: Patient tolerated treatment well Patient left: in bed;with family/visitor present;with call bell/phone within reach  GP     Brenda Frank 04/15/2013, 2:41 PM

## 2013-04-15 NOTE — Progress Notes (Signed)
ANTIBIOTIC CONSULT NOTE   Pharmacy Consult for Vancomycin and Zosyn Indication: sepsis  Allergies  Allergen Reactions  . Haldol [Haloperidol Decanoate] Other (See Comments)    Dystonic reaction, muscle cramping  . Ultram [Tramadol Hcl] Other (See Comments)    Hallucinations  . Ibuprofen Other (See Comments)    Stomach upset inst not to take by dr  . Rogene Houston Other (See Comments)    "if I take more than I/day I start to feel jittery"   Patient Measurements: Height: 5\' 5"  (165.1 cm) Weight: 270 lb 15.1 oz (122.9 kg) IBW/kg (Calculated) : 57 Adjusted Body Weight: 80Kg  Vital Signs: Temp: 97.3 F (36.3 C) (08/18 0741) Temp src: Oral (08/18 0741) BP: 125/110 mmHg (08/18 0600) Pulse Rate: 69 (08/18 0600) Intake/Output from previous day: 08/17 0701 - 08/18 0700 In: 5560 [P.O.:1550; I.V.:3460; IV Piggyback:550] Out: 3845 [Urine:3845] Intake/Output from this shift:    Labs:  Recent Labs  04/13/13 0744 04/13/13 0801 04/14/13 0500 04/15/13 0508  WBC 12.2*  --  13.0* 13.6*  HGB 14.9  --  11.3* 11.7*  PLT 290  --  260 337  CREATININE  --  0.55 0.57 0.45*   Estimated Creatinine Clearance: 118.2 ml/min (by C-G formula based on Cr of 0.45).  Recent Labs  04/15/13 0508  VANCOTROUGH 16.4    Microbiology: Recent Results (from the past 720 hour(s))  CULTURE, BLOOD (ROUTINE X 2)     Status: None   Collection Time    04/13/13  7:41 AM      Result Value Range Status   Specimen Description Blood   Final   Special Requests NONE   Final   Culture NO GROWTH 1 DAY   Final   Report Status PENDING   Incomplete  CULTURE, BLOOD (ROUTINE X 2)     Status: None   Collection Time    04/13/13  8:01 AM      Result Value Range Status   Specimen Description Blood   Final   Special Requests NONE   Final   Culture NO GROWTH 1 DAY   Final   Report Status PENDING   Incomplete  URINE CULTURE     Status: None   Collection Time    04/13/13  8:16 AM      Result Value Range  Status   Specimen Description URINE, CATHETERIZED   Final   Special Requests NONE   Final   Culture  Setup Time     Final   Value: 04/13/2013 21:21     Performed at [facility] Count     Final   Value: NO GROWTH     Performed at [facility]   Culture     Final   Value: NO GROWTH     Performed at [facility]   Report Status 04/14/2013 FINAL   Final  MRSA PCR SCREENING     Status: None   Collection Time    04/13/13  2:16 PM      Result Value Range Status   MRSA by PCR NEGATIVE  NEGATIVE Final   Comment:            The GeneXpert MRSA Assay (FDA     approved for NASAL specimens     only), is one component of a     comprehensive MRSA colonization     surveillance program. It is not     intended to diagnose MRSA     infection  nor to guide or     monitor treatment for     MRSA infections.  CULTURE, EXPECTORATED SPUTUM-ASSESSMENT     Status: None   Collection Time    04/14/13  7:28 AM      Result Value Range Status   Specimen Description SPUTUM   Final   Special Requests Normal   Final   Sputum evaluation     Final   Value: THIS SPECIMEN IS ACCEPTABLE. RESPIRATORY CULTURE REPORT TO FOLLOW.   Report Status 04/14/2013 FINAL   Final   Medical History: Past Medical History  Diagnosis Date  . Hypertension   . Diverticulosis   . 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 Polycythemia 10/25/2011    Query secondary to smoking or hypoxia. Hemoglobin 16-17 g  . Cecal ulcer 10/26/2011    Per colonoscopy  . Diverticulosis 10/26/2011    Per colonoscopy  . GERD (gastroesophageal reflux disease)   . Hernia, inguinal     rt  . Disorder of vocal cords     done  when intubated for hysterectomy  2008  . Chronic bronchitis 05/03/2012    "gets it 1-2X q yr"  . COPD with emphysema   . Pneumonia 2007  . Exertional dyspnea     "because of the COPD"  . History of duodenal ulcer   . Chronic lower back pain   . Bipolar affective     "under control last 12  years" (05/03/2012)  . Skin cancer of face     "had it taken off"  . Asthma   . Emphysema of lung   . Cough   . Wheezing   . Diabetes mellitus     no rx for 3-4 yrs  lost weight; "diet controlled" (05/03/2012)  . CHF (congestive heart failure)    Medications:  Scheduled:  . albuterol  2.5 mg Nebulization Q4H   And  . ipratropium  0.5 mg Nebulization Q4H  . aspirin EC  81 mg Oral Daily  . ceFEPime (MAXIPIME) IV  1 g Intravenous Q8H  . guaiFENesin  600 mg Oral BID  . heparin  5,000 Units Subcutaneous Q8H  . hydrocortisone   Topical TID  . lithium carbonate  600 mg Oral BID  . methylPREDNISolone (SOLU-MEDROL) injection  125 mg Intravenous Q6H  . morphine  60 mg Oral Q12H  . vancomycin  1,000 mg Intravenous Q8H   Assessment: 42yo morbidly obese female admitted with fever and sepsis.  Pt was discharged from [facility] recently and is s/p surgery for hernia repair.  Pt has good renal fxn.  Estimated Creatinine Clearance: 118.2 ml/min (by C-G formula based on Cr of 0.45). Trough level is on target.    Goal of Therapy:  Vancomycin trough level 15-20 mcg/ml  Plan:  Continue Vancomycin 1000mg  IV q8hrs Check trough at steady state Zosyn 3.375gm IV q8hrs to be infused over 4 hours Monitor labs, renal fxn, and cultures  Valrie Hart A 04/15/2013,8:44 AM

## 2013-04-16 DIAGNOSIS — J9600 Acute respiratory failure, unspecified whether with hypoxia or hypercapnia: Secondary | ICD-10-CM

## 2013-04-16 DIAGNOSIS — J9601 Acute respiratory failure with hypoxia: Secondary | ICD-10-CM

## 2013-04-16 LAB — CBC
HCT: 38 % (ref 36.0–46.0)
Hemoglobin: 11.8 g/dL — ABNORMAL LOW (ref 12.0–15.0)
MCH: 29.1 pg (ref 26.0–34.0)
MCHC: 31.1 g/dL (ref 30.0–36.0)
MCV: 93.8 fL (ref 78.0–100.0)
Platelets: 385 10*3/uL (ref 150–400)
RBC: 4.05 MIL/uL (ref 3.87–5.11)
RDW: 13.6 % (ref 11.5–15.5)
WBC: 17.2 10*3/uL — ABNORMAL HIGH (ref 4.0–10.5)

## 2013-04-16 LAB — BASIC METABOLIC PANEL
BUN: 14 mg/dL (ref 6–23)
CO2: 30 mEq/L (ref 19–32)
Calcium: 9.1 mg/dL (ref 8.4–10.5)
Chloride: 104 mEq/L (ref 96–112)
Creatinine, Ser: 0.5 mg/dL (ref 0.50–1.10)
GFR calc Af Amer: >90 mL/min (ref >90)
GFR calc non Af Amer: >90 mL/min (ref >90)
Glucose, Bld: 169 mg/dL — ABNORMAL HIGH (ref 70–99)
Potassium: 4.5 mEq/L (ref 3.5–5.1)
Sodium: 139 mEq/L (ref 135–145)

## 2013-04-16 LAB — GLUCOSE, CAPILLARY
Glucose-Capillary: 124 mg/dL — ABNORMAL HIGH (ref 70–99)
Glucose-Capillary: 129 mg/dL — ABNORMAL HIGH (ref 70–99)
Glucose-Capillary: 181 mg/dL — ABNORMAL HIGH (ref 70–99)

## 2013-04-16 LAB — CULTURE, RESPIRATORY W GRAM STAIN

## 2013-04-16 MED ORDER — METHYLPREDNISOLONE SODIUM SUCC 125 MG IJ SOLR
60.0000 mg | INTRAMUSCULAR | Status: DC
  Administered 2013-04-17: 60 mg via INTRAVENOUS
  Filled 2013-04-16: qty 2

## 2013-04-16 NOTE — Progress Notes (Signed)
Subjective: She has improved. She is still coughing and coughing up sputum now. She had some more episodes of low heart rate during the night last night. Echocardiogram has been done.  Objective: Vital signs in last 24 hours: Temp:  [97.6 F (36.4 C)-98.2 F (36.8 C)] 97.6 F (36.4 C) (08/19 0800) Pulse Rate:  [48-104] 62 (08/19 0800) Resp:  [12-32] 16 (08/19 0800) BP: (106-138)/(54-80) 127/69 mmHg (08/19 0600) SpO2:  [91 %-98 %] 93 % (08/19 0800) Weight:  [124.5 kg (274 lb 7.6 oz)] 124.5 kg (274 lb 7.6 oz) (08/19 0500) Weight change: 1.6 kg (3 lb 8.4 oz) Last BM Date: 04/08/13  Intake/Output from previous day: 08/18 0701 - 08/19 0700 In: 1805 [I.V.:1355; IV Piggyback:450] Out: 720 [Urine:700; Drains:20]  PHYSICAL EXAM General appearance: alert, cooperative and mild distress Resp: rhonchi LLL and LUL Cardio: regular rate and rhythm, S1, S2 normal, no murmur, click, rub or gallop GI: soft, non-tender; bowel sounds normal; no masses,  no organomegaly Extremities: extremities normal, atraumatic, no cyanosis or edema  Lab Results:    Basic Metabolic Panel:  Recent Labs  45/40/98 0500 04/15/13 0508 04/16/13 0430  NA 137 145 139  K 3.9 3.9 4.5  CL 104 111 104  CO2 30 26 30   GLUCOSE 100* 162* 169*  BUN 7 9 14   CREATININE 0.57 0.45* 0.50  CALCIUM 8.0* 9.0 9.1  MG  --  2.0  --    Liver Function Tests:  Recent Labs  04/14/13 0500  AST 11  ALT 12  ALKPHOS 66  BILITOT 0.3  PROT 5.1*  ALBUMIN 2.1*   No results found for this basename: LIPASE, AMYLASE,  in the last 72 hours No results found for this basename: AMMONIA,  in the last 72 hours CBC:  Recent Labs  04/15/13 0508 04/16/13 0430  WBC 13.6* 17.2*  HGB 11.7* 11.8*  HCT 37.5 38.0  MCV 94.0 93.8  PLT 337 385   Cardiac Enzymes: No results found for this basename: CKTOTAL, CKMB, CKMBINDEX, TROPONINI,  in the last 72 hours BNP: No results found for this basename: PROBNP,  in the last 72  hours D-Dimer: No results found for this basename: DDIMER,  in the last 72 hours CBG:  Recent Labs  04/14/13 2122 04/15/13 0717 04/15/13 1107 04/15/13 1720 04/15/13 2113 04/16/13 0732  GLUCAP 203* 134* 141* 150* 173* 124*   Hemoglobin A1C: No results found for this basename: HGBA1C,  in the last 72 hours Fasting Lipid Panel: No results found for this basename: CHOL, HDL, LDLCALC, TRIG, CHOLHDL, LDLDIRECT,  in the last 72 hours Thyroid Function Tests:  Recent Labs  04/15/13 0508  TSH 0.063*   Anemia Panel: No results found for this basename: VITAMINB12, FOLATE, FERRITIN, TIBC, IRON, RETICCTPCT,  in the last 72 hours Coagulation: No results found for this basename: LABPROT, INR,  in the last 72 hours Urine Drug Screen: Drugs of Abuse     Component Value Date/Time   LABOPIA NONE DETECTED 03/08/2009 1553   COCAINSCRNUR NONE DETECTED 03/08/2009 1553   LABBENZ POSITIVE* 03/08/2009 1553   AMPHETMU NONE DETECTED 03/08/2009 1553   THCU NONE DETECTED 03/08/2009 1553   LABBARB  Value: NONE DETECTED        DRUG SCREEN FOR MEDICAL PURPOSES ONLY.  IF CONFIRMATION IS NEEDED FOR ANY PURPOSE, NOTIFY LAB WITHIN 5 DAYS.        LOWEST DETECTABLE LIMITS FOR URINE DRUG SCREEN Drug Class       Cutoff (ng/mL) Amphetamine  1000 Barbiturate      200 Benzodiazepine   200 Tricyclics       300 Opiates          300 Cocaine          300 THC              50 03/08/2009 1553    Alcohol Level: No results found for this basename: ETH,  in the last 72 hours Urinalysis: No results found for this basename: COLORURINE, APPERANCEUR, LABSPEC, PHURINE, GLUCOSEU, HGBUR, BILIRUBINUR, KETONESUR, PROTEINUR, UROBILINOGEN, NITRITE, LEUKOCYTESUR,  in the last 72 hours Misc. Labs:  ABGS  Recent Labs  04/13/13 0825  PHART 7.373  PO2ART 83.5  TCO2 24.7  HCO3 27.7*   CULTURES Recent Results (from the past 240 hour(s))  CULTURE, BLOOD (ROUTINE X 2)     Status: None   Collection Time    04/13/13  7:41 AM       Result Value Range Status   Specimen Description Blood   Final   Special Requests NONE   Final   Culture NO GROWTH 2 DAYS   Final   Report Status PENDING   Incomplete  CULTURE, BLOOD (ROUTINE X 2)     Status: None   Collection Time    04/13/13  8:01 AM      Result Value Range Status   Specimen Description Blood   Final   Special Requests NONE   Final   Culture NO GROWTH 2 DAYS   Final   Report Status PENDING   Incomplete  URINE CULTURE     Status: None   Collection Time    04/13/13  8:16 AM      Result Value Range Status   Specimen Description URINE, CATHETERIZED   Final   Special Requests NONE   Final   Culture  Setup Time     Final   Value: 04/13/2013 21:21     Performed at [facility] Count     Final   Value: NO GROWTH     Performed at [facility]   Culture     Final   Value: NO GROWTH     Performed at [facility]   Report Status 04/14/2013 FINAL   Final  MRSA PCR SCREENING     Status: None   Collection Time    04/13/13  2:16 PM      Result Value Range Status   MRSA by PCR NEGATIVE  NEGATIVE Final   Comment:            The GeneXpert MRSA Assay (FDA     approved for NASAL specimens     only), is one component of a     comprehensive MRSA colonization     surveillance program. It is not     intended to diagnose MRSA     infection nor to guide or     monitor treatment for     MRSA infections.  CULTURE, EXPECTORATED SPUTUM-ASSESSMENT     Status: None   Collection Time    04/14/13  7:28 AM      Result Value Range Status   Specimen Description SPUTUM   Final   Special Requests Normal   Final   Sputum evaluation     Final   Value: THIS SPECIMEN IS ACCEPTABLE. RESPIRATORY CULTURE REPORT TO FOLLOW.   Report Status 04/14/2013 FINAL   Final  CULTURE, RESPIRATORY (NON-EXPECTORATED)     Status: None  Collection Time    04/14/13  7:28 AM      Result Value Range Status   Specimen Description SPUTUM   Final   Special Requests NONE    Final   Gram Stain     Final   Value: MODERATE WBC PRESENT, PREDOMINANTLY PMN     MODERATE SQUAMOUS EPITHELIAL CELLS PRESENT     FEW GRAM POSITIVE RODS     Performed at [facility]   Culture     Final   Value: Culture reincubated for better growth     Performed at [facility]   Report Status PENDING   Incomplete   Studies/Results: No results found.  Medications:  Scheduled: . albuterol  2.5 mg Nebulization Q4H   And  . ipratropium  0.5 mg Nebulization Q4H  . aspirin EC  81 mg Oral Daily  . ceFEPime (MAXIPIME) IV  1 g Intravenous Q8H  . guaiFENesin  600 mg Oral BID  . heparin  5,000 Units Subcutaneous Q8H  . hydrocortisone   Topical TID  . lithium carbonate  600 mg Oral BID  . methylPREDNISolone (SOLU-MEDROL) injection  125 mg Intravenous Q6H  . morphine  60 mg Oral Q12H  . vancomycin  1,000 mg Intravenous Q8H   Continuous: . sodium chloride 1,000 mL (04/15/13 2300)   ZOX:[id number], clonazePAM, diphenhydrAMINE, HYDROcodone-acetaminophen, pantoprazole, senna-docusate  Assesment: She has healthcare associated pneumonia and had sepsis with this. She is improved. She has pretty severe COPD and did successfully stop smoking about 3 months ago. She had recent surgery to repair a hernia. She has hypertension and her blood pressure has been okay. Active Problems:   DIABETES MELLITUS   HYPERTENSION   BIPOLAR AFFECTIVE DISORDER, HX OF   Tobacco abuse   HCAP (healthcare-associated pneumonia)   Sepsis   COPD (chronic obstructive pulmonary disease)    Plan: Continue current medications. She does seem to be slowly improving.    LOS: 3 days   Yesica Kemler L 04/16/2013, 8:19 AM

## 2013-04-16 NOTE — Progress Notes (Signed)
[facility] PROGRESS NOTE  Brenda Frank ZOX:[id number] DOB: 12/17/1970 DOA: 04/13/2013 PCP: Catalina Pizza, MD  Assessment/Plan: 1. Suspected sepsis on admission: Appears resolved. Presumably secondary to pneumonia  2. Right-sided healthcare associated pneumonia: Continues to improve. Continue empiric antibiotics. Likely change to oral therapy 8/20. 3. Acute hypoxic respiratory failure: Appears to be resolving. Continue supplemental oxygen, treat pneumonia. Complicated by COPD. 4. Sinus Bradycardia: Followup echocardiogram. Asymptomatic. Most pronounced at night. Consider outpatient cardiology evaluation. 5. Status post incisional hernia repair 8/12: Appears to be healing. 6. COPD: Appears stable.  7. Bipolar affective disorder: Appears stable. Continue lithium. 8. Diabetes mellitus, diet controlled: Stable. 9. Erythematous rash upper back: Present since last hospitalization. Appears to be resolving. Possible contact dermatitis. 10. Constipation: Positive bowel movements. 11. TSH low: Followup T3, T4.   Transfer to medical bed with telemetry  Discontinue IV fluids  Wean steroids  Check T3, free T4  Change to oral antibiotics 8/20  Home in 48 hours  Code Status: Full code DVT prophylaxis: Heparin Family Communication: Discussed partner bedside Disposition Plan: Home with home health physical therapy  Brenda Sacks, MD  [facility]  Pager [phone] If 7PM-7AM, please contact night-coverage at [email or web address], password [facility] 04/16/2013, 10:52 AM  LOS: 3 days   Clinical Summary: 42 year old woman discharged from [facility] 8/15 (no discharge summary available) status post incisional hernia repair. Presented to [facility] 8/16 with fever. Initial evaluation was notable for fever, profound hypoxia requiring high flow oxygen, chest x-ray consistent with right-sided pneumonia. Patient treated empirically for sepsis, healthcare associated pneumonia, acute encephalopathy.  Abdominal wound was felt to be unremarkable at time of admission.  She was seen in consultation with pulmonology  Consultants:  Pulmonology  Physical therapy: Home health physical therapy.  Procedures:  Echocardiogram:  Antibiotics: Vancomycin 8/16  Cefepime 8/16  HPI/Subjective: Overall feels much better. Some cough. Breathing better. Eating fine. No new issues. Some bradycardia has been noted overnight. Asymptomatic.  Objective: Filed Vitals:   04/16/13 0600 04/16/13 0722 04/16/13 0800 04/16/13 1040  BP: 127/69     Pulse: 65  62   Temp:   97.6 F (36.4 C)   TempSrc:   Oral   Resp: 15  16   Height:      Weight:      SpO2: 92% 96% 93% 94%    Intake/Output Summary (Last 24 hours) at 04/16/13 1052 Last data filed at 04/16/13 0500  Gross per 24 hour  Intake   1805 ml  Output    720 ml  Net   1085 ml     Filed Weights   04/14/13 0500 04/15/13 0649 04/16/13 0500  Weight: 119.6 kg (263 lb 10.7 oz) 122.9 kg (270 lb 15.1 oz) 124.5 kg (274 lb 7.6 oz)    Exam:   Afebrile, vitals stable. Bradycardia noted overnight.  General: Appears calm and comfortable. Speech fluent and clear. Well-appearing.  Cardiovascular: Regular rate and rhythm. No murmur, rub, gallop. Trace bilateral lower extremity edema.  Respiratory: Left clear to auscultation bilaterally. Right some inspiratory crackles posteriorly. Normal respiratory effort. No rhonchi or rales noted. Wheezes noted.  Back: Flank/back incision noted, well approximated with staples, appears to be healing. Drain in place. No surrounding erythema or evidence of infection.  Abdomen: Soft, nontender, nondistended.  Psychiatric: Grossly normal mood and affect. Speech fluent and appropriate.  Data Reviewed:  Capillary blood sugars stable   respiratory culture Candida  Basic metabolic panel unremarkable  White Blood cell count 17.2.  Pending studies:  Blood cultures no growth today  Scheduled Meds: .  albuterol  2.5 mg Nebulization Q4H   And  . ipratropium  0.5 mg Nebulization Q4H  . aspirin EC  81 mg Oral Daily  . ceFEPime (MAXIPIME) IV  1 g Intravenous Q8H  . guaiFENesin  600 mg Oral BID  . heparin  5,000 Units Subcutaneous Q8H  . hydrocortisone   Topical TID  . lithium carbonate  600 mg Oral BID  . methylPREDNISolone (SOLU-MEDROL) injection  125 mg Intravenous Q6H  . morphine  60 mg Oral Q12H  . vancomycin  1,000 mg Intravenous Q8H   Continuous Infusions: . sodium chloride 1,000 mL (04/15/13 2300)    Principal Problem:   Sepsis Active Problems:   DIABETES MELLITUS   HYPERTENSION   BIPOLAR AFFECTIVE DISORDER, HX OF   Tobacco abuse   HCAP (healthcare-associated pneumonia)   COPD (chronic obstructive pulmonary disease)   Acute respiratory failure with hypoxia   Time spent 25 minutes

## 2013-04-17 LAB — CBC
HCT: 37 % (ref 36.0–46.0)
MCHC: 31.1 g/dL (ref 30.0–36.0)
RDW: 13.9 % (ref 11.5–15.5)

## 2013-04-17 LAB — GLUCOSE, CAPILLARY: Glucose-Capillary: 159 mg/dL — ABNORMAL HIGH (ref 70–99)

## 2013-04-17 MED ORDER — PREDNISONE 20 MG PO TABS: 40.0000 mg | ORAL_TABLET | Freq: Every day | ORAL | Status: DC

## 2013-04-17 MED ORDER — ROFLUMILAST 500 MCG PO TABS: 500.0000 ug | ORAL_TABLET | Freq: Every day | ORAL | Status: DC

## 2013-04-17 MED ORDER — CEFUROXIME AXETIL 500 MG PO TABS: 500.0000 mg | ORAL_TABLET | Freq: Two times a day (BID) | ORAL | Status: DC

## 2013-04-17 MED ORDER — PREDNISONE 10 MG PO TABS: ORAL_TABLET | ORAL | Status: DC

## 2013-04-17 MED ORDER — TIOTROPIUM BROMIDE MONOHYDRATE 18 MCG IN CAPS
18.0000 ug | ORAL_CAPSULE | Freq: Every day | RESPIRATORY_TRACT | Status: DC
Start: 2013-04-17 — End: 2013-04-17
  Filled 2013-04-17: qty 5

## 2013-04-17 MED ORDER — TIOTROPIUM BROMIDE MONOHYDRATE 18 MCG IN CAPS: 18.0000 ug | ORAL_CAPSULE | Freq: Every day | RESPIRATORY_TRACT | Status: AC

## 2013-04-17 MED ORDER — CEFUROXIME AXETIL 250 MG PO TABS: 500.0000 mg | ORAL_TABLET | Freq: Two times a day (BID) | ORAL | Status: DC

## 2013-04-17 MED ORDER — ROFLUMILAST 500 MCG PO TABS
500.0000 ug | ORAL_TABLET | Freq: Every day | ORAL | Status: DC
  Administered 2013-04-17: 500 ug via ORAL
  Filled 2013-04-17 (×3): qty 1

## 2013-04-17 NOTE — Progress Notes (Signed)
Subjective: She says she feels pretty well. She is continuing to have some cough which is productive of sputum. She has no other new complaints.  Objective: Vital signs in last 24 hours: Temp:  [97.6 F (36.4 C)-98.9 F (37.2 C)] 98 F (36.7 C) (08/20 0400) Pulse Rate:  [31-131] 46 (08/20 0600) Resp:  [12-25] 20 (08/20 0600) BP: (125-139)/(60-85) 126/75 mmHg (08/20 0600) SpO2:  [87 %-100 %] 95 % (08/20 0739) Weight:  [126.8 kg (279 lb 8.7 oz)] 126.8 kg (279 lb 8.7 oz) (08/20 0500) Weight change: 2.3 kg (5 lb 1.1 oz) Last BM Date: 04/16/13  Intake/Output from previous day: 08/19 0701 - 08/20 0700 In: 2595 [P.O.:1320; I.V.:925; IV Piggyback:350] Out: 415 [Urine:400; Drains:15]  PHYSICAL EXAM General appearance: alert, cooperative and mild distress Resp: rhonchi RLL, RML and RUL Cardio: regular rate and rhythm, S1, S2 normal, no murmur, click, rub or gallop GI: soft, non-tender; bowel sounds normal; no masses,  no organomegaly Extremities: extremities normal, atraumatic, no cyanosis or edema  Lab Results:    Basic Metabolic Panel:  Recent Labs  16/10/96 0508 04/16/13 0430  NA 145 139  K 3.9 4.5  CL 111 104  CO2 26 30  GLUCOSE 162* 169*  BUN 9 14  CREATININE 0.45* 0.50  CALCIUM 9.0 9.1  MG 2.0  --    Liver Function Tests: No results found for this basename: AST, ALT, ALKPHOS, BILITOT, PROT, ALBUMIN,  in the last 72 hours No results found for this basename: LIPASE, AMYLASE,  in the last 72 hours No results found for this basename: AMMONIA,  in the last 72 hours CBC:  Recent Labs  04/16/13 0430 04/17/13 0451  WBC 17.2* 14.1*  HGB 11.8* 11.5*  HCT 38.0 37.0  MCV 93.8 94.1  PLT 385 391   Cardiac Enzymes: No results found for this basename: CKTOTAL, CKMB, CKMBINDEX, TROPONINI,  in the last 72 hours BNP: No results found for this basename: PROBNP,  in the last 72 hours D-Dimer: No results found for this basename: DDIMER,  in the last 72  hours CBG:  Recent Labs  04/15/13 1720 04/15/13 2113 04/16/13 0732 04/16/13 1125 04/16/13 1637 04/16/13 2118  GLUCAP 150* 173* 124* 125* 129* 181*   Hemoglobin A1C: No results found for this basename: HGBA1C,  in the last 72 hours Fasting Lipid Panel: No results found for this basename: CHOL, HDL, LDLCALC, TRIG, CHOLHDL, LDLDIRECT,  in the last 72 hours Thyroid Function Tests:  Recent Labs  04/15/13 0508 04/16/13 0430  TSH 0.063*  --   FREET4  --  0.90   Anemia Panel: No results found for this basename: VITAMINB12, FOLATE, FERRITIN, TIBC, IRON, RETICCTPCT,  in the last 72 hours Coagulation: No results found for this basename: LABPROT, INR,  in the last 72 hours Urine Drug Screen: Drugs of Abuse     Component Value Date/Time   LABOPIA NONE DETECTED 03/08/2009 1553   COCAINSCRNUR NONE DETECTED 03/08/2009 1553   LABBENZ POSITIVE* 03/08/2009 1553   AMPHETMU NONE DETECTED 03/08/2009 1553   THCU NONE DETECTED 03/08/2009 1553   LABBARB  Value: NONE DETECTED        DRUG SCREEN FOR MEDICAL PURPOSES ONLY.  IF CONFIRMATION IS NEEDED FOR ANY PURPOSE, NOTIFY LAB WITHIN 5 DAYS.        LOWEST DETECTABLE LIMITS FOR URINE DRUG SCREEN Drug Class       Cutoff (ng/mL) Amphetamine      1000 Barbiturate      200 Benzodiazepine  200 Tricyclics       300 Opiates          300 Cocaine          300 THC              50 03/08/2009 1553    Alcohol Level: No results found for this basename: ETH,  in the last 72 hours Urinalysis: No results found for this basename: COLORURINE, APPERANCEUR, LABSPEC, PHURINE, GLUCOSEU, HGBUR, BILIRUBINUR, KETONESUR, PROTEINUR, UROBILINOGEN, NITRITE, LEUKOCYTESUR,  in the last 72 hours Misc. Labs:  ABGS No results found for this basename: PHART, PCO2, PO2ART, TCO2, HCO3,  in the last 72 hours CULTURES Recent Results (from the past 240 hour(s))  CULTURE, BLOOD (ROUTINE X 2)     Status: None   Collection Time    04/13/13  7:41 AM      Result Value Range Status    Specimen Description BLOOD RIGHT ARM   Final   Special Requests BOTTLES DRAWN AEROBIC ONLY 8CC   Final   Culture NO GROWTH 3 DAYS   Final   Report Status PENDING   Incomplete  CULTURE, BLOOD (ROUTINE X 2)     Status: None   Collection Time    04/13/13  8:01 AM      Result Value Range Status   Specimen Description BLOOD RIGHT ARM   Final   Special Requests BOTTLES DRAWN AEROBIC AND ANAEROBIC 10CC   Final   Culture NO GROWTH 3 DAYS   Final   Report Status PENDING   Incomplete  URINE CULTURE     Status: None   Collection Time    04/13/13  8:16 AM      Result Value Range Status   Specimen Description URINE, CATHETERIZED   Final   Special Requests NONE   Final   Culture  Setup Time     Final   Value: 04/13/2013 21:21     Performed at [facility] Count     Final   Value: NO GROWTH     Performed at [facility]   Culture     Final   Value: NO GROWTH     Performed at [facility]   Report Status 04/14/2013 FINAL   Final  MRSA PCR SCREENING     Status: None   Collection Time    04/13/13  2:16 PM      Result Value Range Status   MRSA by PCR NEGATIVE  NEGATIVE Final   Comment:            The GeneXpert MRSA Assay (FDA     approved for NASAL specimens     only), is one component of a     comprehensive MRSA colonization     surveillance program. It is not     intended to diagnose MRSA     infection nor to guide or     monitor treatment for     MRSA infections.  CULTURE, EXPECTORATED SPUTUM-ASSESSMENT     Status: None   Collection Time    04/14/13  7:28 AM      Result Value Range Status   Specimen Description SPUTUM   Final   Special Requests Normal   Final   Sputum evaluation     Final   Value: THIS SPECIMEN IS ACCEPTABLE. RESPIRATORY CULTURE REPORT TO FOLLOW.   Report Status 04/14/2013 FINAL   Final  CULTURE, RESPIRATORY (NON-EXPECTORATED)     Status: None  Collection Time    04/14/13  7:28 AM      Result Value Range Status   Specimen  Description SPUTUM   Final   Special Requests NONE   Final   Gram Stain     Final   Value: MODERATE WBC PRESENT, PREDOMINANTLY PMN     MODERATE SQUAMOUS EPITHELIAL CELLS PRESENT     FEW GRAM POSITIVE RODS     Performed at [facility]   Culture     Final   Value: MODERATE CANDIDA ALBICANS     Performed at [facility]   Report Status 04/16/2013 FINAL   Final   Studies/Results: No results found.  Medications:  Scheduled: . albuterol  2.5 mg Nebulization Q4H   And  . ipratropium  0.5 mg Nebulization Q4H  . aspirin EC  81 mg Oral Daily  . ceFEPime (MAXIPIME) IV  1 g Intravenous Q8H  . guaiFENesin  600 mg Oral BID  . heparin  5,000 Units Subcutaneous Q8H  . hydrocortisone   Topical TID  . lithium carbonate  600 mg Oral BID  . methylPREDNISolone (SOLU-MEDROL) injection  60 mg Intravenous Q24H  . morphine  60 mg Oral Q12H  . vancomycin  1,000 mg Intravenous Q8H   Continuous:  WJX:[id number], clonazePAM, diphenhydrAMINE, HYDROcodone-acetaminophen, pantoprazole, senna-docusate  Assesment: She was admitted with healthcare associated pneumonia and initially was septic with that. She is much improved. She did also have acute respiratory failure with hypoxia and I think she'll probably have to go home with oxygen. She has multiple episodes of exacerbation of COPD. She does not feel that tudorza helped her very well. Spiriva has an indication to reduce the number of COPD exacerbations so I think we should switch her back to that. She should also start Daliresp which may help reduce the COPD exacerbations Principal Problem:   Sepsis Active Problems:   DIABETES MELLITUS   HYPERTENSION   BIPOLAR AFFECTIVE DISORDER, HX OF   Tobacco abuse   HCAP (healthcare-associated pneumonia)   COPD (chronic obstructive pulmonary disease)   Acute respiratory failure with hypoxia    Plan: As above. I did not put her on Spiriva because she's getting the Atrovent by nebulizer.    LOS: 4  days   Lavetta Geier L 04/17/2013, 7:58 AM

## 2013-04-17 NOTE — Progress Notes (Signed)
Pt was educated regarding keeping all follow up appointments, taking new prescribed meds, PTA medication, and those to discontinue. Pt's IV was removed by Thurnell Lose, RN. All personal items were taken and new 02 tank with carrier were as well. Pt left via wheelchair to personal vehicle.

## 2013-04-17 NOTE — Discharge Summary (Signed)
Physician Discharge Summary  Brenda Frank AVW:[id number] DOB: 02/22/1971 DOA: 04/13/2013  PCP: Brenda Pizza, MD  Admit date: 04/13/2013 Discharge date: 04/17/2013  Recommendations for Outpatient Follow-up:  1. Followup resolution of pneumonia 2. Followup hypoxic respiratory failure, wean oxygen as tolerated 3. Sinus bradycardia, asymptomatic, see discussion below 4. Keep followup with general surgery as previously scheduled 5. Note changes to COPD regimen  6. Consider repeat TFTs in 4-6 weeks  Follow-up Information   Follow up with Brenda Pizza, MD In 1 week.   Specialty:  Internal Medicine   Contact informationSidney Frank [facility] [phone]      Discharge Diagnoses:  1. Suspected sepsis on admission 2. Right-sided healthcare associated pneumonia 3. Acute hypoxic respiratory failure 4. COPD 5. Status post incisional hernia repair 8/12 (prior to admission) 6. Asymptomatic sinus bradycardia 7. Diabetes mellitus 8. Abnormal TSH  Discharge Condition: Improved Disposition: Home with home health physical therapy, oxygen at 2 L per minute nasal cannula continuous  Diet recommendation: Carb modified  Filed Weights   04/15/13 0649 04/16/13 0500 04/17/13 0500  Weight: 122.9 kg (270 lb 15.1 oz) 124.5 kg (274 lb 7.6 oz) 126.8 kg (279 lb 8.7 oz)    History of present illness:  42 year old woman discharged from [facility] 8/15 (no discharge summary available) status post incisional hernia repair. Presented to [facility] 8/16 with fever. Initial evaluation was notable for fever, profound hypoxia requiring high flow oxygen, chest x-ray consistent with right-sided pneumonia. Patient treated empirically for sepsis, healthcare associated pneumonia, acute encephalopathy. Abdominal wound was felt to be unremarkable at time of admission.  Hospital Course:  Brenda Frank was admitted for further treatment of sepsis and healthcare associated pneumonia. Her condition rapidly improved and antibiotics were  narrowed. She does have a history of severe COPD and still has hypoxic respiratory failure at this time, secondary to pneumonia superimposed on COPD. She was seen in consultation with pulmonology. She is stable for discharge at this time on home oxygen and will complete antibiotics as an outpatient. Adjustments were made to COPD regimen per pulmonology. See individual issues as below. Candida albicans in respiratory culture was felt to be nonpathogenic.  1. Suspected sepsis on admission: Resolved. Presumably secondary to pneumonia  2. Right-sided healthcare associated pneumonia: Continues to improve. RS 8 PCR was negative. Change to oral antibiotics. 3. Acute hypoxic respiratory failure: Continues to improve. Continue supplemental oxygen, treat pneumonia. Complicated by COPD. 4. Sinus Bradycardia: Echocardiogram unremarkable. Asymptomatic. Most pronounced at night. Consider outpatient cardiology evaluation, deferred to PCP. 5. Status post incisional hernia repair 8/12: Appears to be healing. Followup with surgery as an outpatient. 6. COPD: Appears stable. Will need home oxygen. Quit smoking several months ago. 7. Bipolar affective disorder: Appears stable. Continue lithium. 8. Diabetes mellitus, diet controlled: Stable. 9. Erythematous rash upper back: Appears resolved. 10. Constipation: Positive bowel movements. 11. TSH low: Free T4 normal. Repeat thyroid status in 4 weeks. Start Spiriva and Daliresp per Dr. Juanetta Frank  Consider repeat imaging of the chest in 4-6 weeks to document resolution    Discharge Instructions  Discharge Orders   Future Appointments Provider Department Dept Phone   04/19/2013 8:45 AM Brenda Loron, MD [facility] Surgery, [place] [phone]   Future Orders Complete By Expires   Diet Carb Modified  As directed    Discharge instructions  As directed    Comments:     Be sure to complete antibiotic (Ceftin) and prednisone. Hold your blood pressure medication until  followup with your primary  care physician. Dr. Juanetta Frank has recommended starting Spiriva and Daliresp. Call your physician was immediate medical attention for fever, shortness of breath or worsening of condition.   Increase activity slowly  As directed        Medication List    STOP taking these medications       ipratropium-albuterol 0.5-2.5 (3) MG/3ML Soln  Commonly known as:  DUONEB     olmesartan-hydrochlorothiazide 40-25 MG per tablet  Commonly known as:  BENICAR HCT     TUDORZA PRESSAIR 400 MCG/ACT Aepb  Generic drug:  Aclidinium Bromide      TAKE these medications       albuterol 108 (90 BASE) MCG/ACT inhaler  Commonly known as:  PROVENTIL HFA;VENTOLIN HFA  Inhale 2 puffs into the lungs every 6 (six) hours as needed. For RESCUE.     aspirin EC 81 MG tablet  Take 81 mg by mouth daily.     cefUROXime 500 MG tablet  Commonly known as:  CEFTIN  Take 1 tablet (500 mg total) by mouth 2 (two) times daily with a meal.     clonazePAM 2 MG tablet  Commonly known as:  KLONOPIN  Take 2 mg by mouth 4 (four) times daily as needed for anxiety. For anxiety     cyclobenzaprine 10 MG tablet  Commonly known as:  FLEXERIL  Take 10 mg by mouth 3 (three) times daily as needed for muscle spasms.     docusate sodium 100 MG capsule  Commonly known as:  COLACE  Take 100 mg by mouth daily as needed for constipation.     HYDROcodone-acetaminophen 10-325 MG per tablet  Commonly known as:  NORCO  Take 1-2 tablets by mouth every 6 (six) hours as needed for pain.     lithium carbonate 300 MG CR tablet  Commonly known as:  LITHOBID  Take 600 mg by mouth 2 (two) times daily.     oxymorphone 40 MG 12 hr tablet  Commonly known as:  OPANA ER  Take 40 mg by mouth every 12 (twelve) hours.     pantoprazole 40 MG tablet  Commonly known as:  PROTONIX  Take 40 mg by mouth daily as needed (for reflux).     predniSONE 10 MG tablet  Commonly known as:  DELTASONE  8/21-8/23: Take 40 mg by mouth  daily. 8/24-8/26: take 20 mg by mouth daily. 8/27-8/29: Take 10 mg by mouth daily then stop.     roflumilast 500 MCG Tabs tablet  Commonly known as:  DALIRESP  Take 1 tablet (500 mcg total) by mouth daily.     senna-docusate 8.6-50 MG per tablet  Commonly known as:  Senokot-S  Take 1 tablet by mouth daily as needed for constipation.     tiotropium 18 MCG inhalation capsule  Commonly known as:  SPIRIVA  Place 1 capsule (18 mcg total) into inhaler and inhale daily.       Allergies  Allergen Reactions  . Haldol [Haloperidol Decanoate] Other (See Comments)    Dystonic reaction, muscle cramping  . Ultram [Tramadol Hcl] Other (See Comments)    Hallucinations  . Ibuprofen Other (See Comments)    Stomach upset inst not to take by dr  . Rogene [place] Other (See Comments)    "if I take more than I/day I start to feel jittery"    The results of significant diagnostics from this hospitalization (including imaging, microbiology, ancillary and laboratory) are listed below for reference.    Significant Diagnostic Studies: Dg  Chest Portable 1 View  04/13/2013   *RADIOLOGY REPORT*  Clinical Data: Fever.  Recent hernia surgery.  PORTABLE CHEST - 1 VIEW  Comparison: 04/01/2013  Findings: There is extensive airspace disease throughout a large portion of the right lung.  Prominent interstitial markings on the left could represent volume loss and edema.  Heart size is grossly normal.  IMPRESSION: Extensive airspace disease in the right lung is concerning for pneumonia. There may also be a component of pulmonary edema.   Original Report Authenticated By: Richarda Overlie, M.D.    Microbiology: Recent Results (from the past 240 hour(s))  CULTURE, BLOOD (ROUTINE X 2)     Status: None   Collection Time    04/13/13  7:41 AM      Result Value Range Status   Specimen Description BLOOD RIGHT ARM   Final   Special Requests BOTTLES DRAWN AEROBIC ONLY 8CC   Final   Culture NO GROWTH 3 DAYS   Final   Report  Status PENDING   Incomplete  CULTURE, BLOOD (ROUTINE X 2)     Status: None   Collection Time    04/13/13  8:01 AM      Result Value Range Status   Specimen Description BLOOD RIGHT ARM   Final   Special Requests BOTTLES DRAWN AEROBIC AND ANAEROBIC 10CC   Final   Culture NO GROWTH 3 DAYS   Final   Report Status PENDING   Incomplete  URINE CULTURE     Status: None   Collection Time    04/13/13  8:16 AM      Result Value Range Status   Specimen Description URINE, CATHETERIZED   Final   Special Requests NONE   Final   Culture  Setup Time     Final   Value: 04/13/2013 21:21     Performed at [facility] Count     Final   Value: NO GROWTH     Performed at [facility]   Culture     Final   Value: NO GROWTH     Performed at [facility]   Report Status 04/14/2013 FINAL   Final  MRSA PCR SCREENING     Status: None   Collection Time    04/13/13  2:16 PM      Result Value Range Status   MRSA by PCR NEGATIVE  NEGATIVE Final   Comment:            The GeneXpert MRSA Assay (FDA     approved for NASAL specimens     only), is one component of a     comprehensive MRSA colonization     surveillance program. It is not     intended to diagnose MRSA     infection nor to guide or     monitor treatment for     MRSA infections.  CULTURE, EXPECTORATED SPUTUM-ASSESSMENT     Status: None   Collection Time    04/14/13  7:28 AM      Result Value Range Status   Specimen Description SPUTUM   Final   Special Requests Normal   Final   Sputum evaluation     Final   Value: THIS SPECIMEN IS ACCEPTABLE. RESPIRATORY CULTURE REPORT TO FOLLOW.   Report Status 04/14/2013 FINAL   Final  CULTURE, RESPIRATORY (NON-EXPECTORATED)     Status: None   Collection Time    04/14/13  7:28 AM      Result Value  Range Status   Specimen Description SPUTUM   Final   Special Requests NONE   Final   Gram Stain     Final   Value: MODERATE WBC PRESENT, PREDOMINANTLY PMN     MODERATE  SQUAMOUS EPITHELIAL CELLS PRESENT     FEW GRAM POSITIVE RODS     Performed at [facility]   Culture     Final   Value: MODERATE CANDIDA ALBICANS     Performed at [facility]   Report Status 04/16/2013 FINAL   Final     Labs: Basic Metabolic Panel:  Recent Labs Lab 04/13/13 0801 04/14/13 0500 04/15/13 0508 04/16/13 0430  NA 137 137 145 139  K 3.9 3.9 3.9 4.5  CL 101 104 111 104  CO2 29 30 26 30   GLUCOSE 165* 100* 162* 169*  BUN 10 7 9 14   CREATININE 0.55 0.57 0.45* 0.50  CALCIUM 9.4 8.0* 9.0 9.1  MG  --   --  2.0  --    Liver Function Tests:  Recent Labs Lab 04/13/13 0801 04/14/13 0500  AST 17 11  ALT 14 12  ALKPHOS 81 66  BILITOT 0.5 0.3  PROT 6.5 5.1*  ALBUMIN 3.0* 2.1*    Recent Labs Lab 04/13/13 0801  LIPASE 28   CBC:  Recent Labs Lab 04/13/13 0744 04/14/13 0500 04/15/13 0508 04/16/13 0430 04/17/13 0451  WBC 12.2* 13.0* 13.6* 17.2* 14.1*  NEUTROABS 10.9*  --   --   --   --   HGB 14.9 11.3* 11.7* 11.8* 11.5*  HCT 46.4* 36.4 37.5 38.0 37.0  MCV 92.1 94.8 94.0 93.8 94.1  PLT 290 260 337 385 391     Recent Labs  04/13/13 0801  PROBNP 233.1*   CBG:  Recent Labs Lab 04/16/13 0732 04/16/13 1125 04/16/13 1637 04/16/13 2118 04/17/13 0802  GLUCAP 124* 125* 129* 181* 88    Principal Problem:   Sepsis Active Problems:   DIABETES MELLITUS   HYPERTENSION   BIPOLAR AFFECTIVE DISORDER, HX OF   Tobacco abuse   HCAP (healthcare-associated pneumonia)   COPD (chronic obstructive pulmonary disease)   Acute respiratory failure with hypoxia   Time coordinating discharge: 35 minutes  Signed:  [name], MD [facility] 04/17/2013, 8:52 AM

## 2013-04-17 NOTE — Progress Notes (Signed)
Removed 02 at rest saturation was at 90%, 02 sats were 78-79% without 02 and applied oxygen at 2L and sats were 82-85% and at 3L sats were 87-91% while ambulating.

## 2013-04-17 NOTE — Progress Notes (Signed)
Brenda Frank PROGRESS NOTE  Brenda Frank Brenda Frank:[id number] DOB: September 02, 1970 DOA: 04/13/2013 PCP: Brenda Pizza, MD  Assessment/Plan: 1. Suspected sepsis on admission: Resolved. Presumably secondary to pneumonia  2. Right-sided healthcare associated pneumonia: Continues to improve. RS 8 PCR was negative. Change to oral antibiotics. 3. Acute hypoxic respiratory failure: Continues to improve. Continue supplemental oxygen, treat pneumonia. Complicated by COPD. 4. Sinus Bradycardia: Echocardiogram unremarkable. Asymptomatic. Most pronounced at night. Consider outpatient cardiology evaluation, deferred to PCP. 5. Status post incisional hernia repair 8/12: Appears to be healing. Followup with surgery as an outpatient. 6. COPD: Appears stable. Will need home oxygen. Quit smoking several months ago. 7. Bipolar affective disorder: Appears stable. Continue lithium. 8. Diabetes mellitus, diet controlled: Stable. 9. Erythematous rash upper back: Appears resolved. 10. Constipation: Positive bowel movements. 11. TSH low: Free T4 normal. Repeat thyroid status in 4 weeks.   Change to oral steroids.  Start Spiriva and Daliresp per Dr. Juanetta Frank  Change to oral antibiotics  Followup thyroid function tests in 4 weeks  Consider repeat imaging of the chest in 4-6 weeks to document resolution  Home today with oxygen, home health PT  Pending studies:   Blood cultures no growth today 8/20  Code Status: Full code DVT prophylaxis: Heparin Family Communication: Discussed partner bedside again 8/20 Disposition Plan: Home with home health physical therapy  Brenda Sacks, MD  Brenda Frank  Pager [phone] If 7PM-7AM, please contact night-coverage at [email or web address], password Brenda Frank 04/17/2013, 8:35 AM  LOS: 4 days   Clinical Summary: 42 year old woman discharged from [facility] (no discharge summary available) status post incisional hernia repair. Presented to Brenda Frank 8/16 with fever. Initial  evaluation was notable for fever, profound hypoxia requiring high flow oxygen, chest x-ray consistent with right-sided pneumonia. Patient treated empirically for sepsis, healthcare associated pneumonia, acute encephalopathy. Abdominal wound was felt to be unremarkable at time of admission.  She was seen in consultation with pulmonology  Consultants:  Pulmonology  Physical therapy: Home health physical therapy.  Procedures:  Echocardiogram:  Antibiotics: Vancomycin 8/16  >> 8/20 Cefepime 8/16 >> 8/20 Ceftin 8/20 >> 8/29  HPI/Subjective: Continues to feel better. Some cough. Breathing better. Eating better, appetite improved. Wants to go home.  Objective: Filed Vitals:   04/17/13 0440 04/17/13 0500 04/17/13 0600 04/17/13 0739  BP: 126/75  126/75   Pulse: 61 45 46   Temp:      TempSrc:      Resp: 24 15 20    Height:      Weight:  126.8 kg (279 lb 8.7 oz)    SpO2: 97% 96% 95% 95%    Intake/Output Summary (Last 24 hours) at 04/17/13 0835 Last data filed at 04/17/13 0548  Gross per 24 hour  Intake   2115 ml  Output    415 ml  Net   1700 ml     Filed Weights   04/15/13 0649 04/16/13 0500 04/17/13 0500  Weight: 122.9 kg (270 lb 15.1 oz) 124.5 kg (274 lb 7.6 oz) 126.8 kg (279 lb 8.7 oz)    Exam:   Afebrile, vitals stable. Bradycardia noted again overnight.  General: Appears calm and comfortable. Speech fluent and clear. Well-appearing.  Cardiovascular: Regular rate and rhythm. No murmur, rub, gallop. Trace bilateral lower extremity edema.  Telemetry: Sinus rhythm.   Respiratory: Anterior clear to auscultation bilaterally with good air movement, no wheezing, rales, rhonchi. Left posterior clear. Right posterior with some decreased air movement and a few crackles. Normal respiratory effort.  Back: Flank/back  incision noted, well approximated with staples, appears to be healing. Drain in place. No surrounding erythema or evidence of infection.  Skin: No evidence of  rash on back.  Psychiatric: Grossly normal mood and affect. Speech fluent and appropriate.  Data Reviewed:  Echocardiogram noted  White blood cell count decreased 17.2 >> 14.1  Free T4 normal  Scheduled Meds: . albuterol  2.5 mg Nebulization Q4H   And  . ipratropium  0.5 mg Nebulization Q4H  . aspirin EC  81 mg Oral Daily  . ceFEPime (MAXIPIME) IV  1 g Intravenous Q8H  . guaiFENesin  600 mg Oral BID  . heparin  5,000 Units Subcutaneous Q8H  . hydrocortisone   Topical TID  . lithium carbonate  600 mg Oral BID  . methylPREDNISolone (SOLU-MEDROL) injection  60 mg Intravenous Q24H  . morphine  60 mg Oral Q12H  . roflumilast  500 mcg Oral Daily  . vancomycin  1,000 mg Intravenous Q8H   Continuous Infusions:    Principal Problem:   Sepsis Active Problems:   DIABETES MELLITUS   HYPERTENSION   BIPOLAR AFFECTIVE DISORDER, HX OF   Tobacco abuse   HCAP (healthcare-associated pneumonia)   COPD (chronic obstructive pulmonary disease)   Acute respiratory failure with hypoxia

## 2013-04-17 NOTE — Progress Notes (Signed)
[employer] delivered 02 and educated pt on administration for home use.

## 2013-04-17 NOTE — Progress Notes (Signed)
Pts 02 sats 90% on RA at rest. Pts 02 sats 78-79% while ambulating on RA. Pts 02 sats 87-91% on 3L while ambulating

## 2013-04-17 NOTE — Plan of Care (Signed)
Problem: Phase II Progression Outcomes Goal: Encourage coughing & deep breathing Outcome: Completed/Met Date Met:  04/17/13 Pt is also using incentive spirometer. Goal: Wean O2 if indicated Outcome: Progressing Pt is down to 2L  Problem: Phase III Progression Outcomes Goal: O2 sats > or equal to 93% on room air Outcome: Not Progressing Pt's were 90-91% on RA. Goal: Pain controlled on oral analgesia Outcome: Progressing Pt has chronic back pain along with surgical pain. States she is always in pain

## 2013-04-17 NOTE — Progress Notes (Signed)
UR chart review completed.  

## 2013-04-18 ENCOUNTER — Telehealth (INDEPENDENT_AMBULATORY_CARE_PROVIDER_SITE_OTHER): Payer: Self-pay

## 2013-04-18 LAB — CULTURE, BLOOD (ROUTINE X 2)
Culture: NO GROWTH
Culture: NO GROWTH

## 2013-04-18 NOTE — Telephone Encounter (Signed)
LMOM at home and cell for pt to call me back. I am calling to check on her and see if she is out of [facility].

## 2013-04-18 NOTE — Discharge Summary (Signed)
Physician Discharge Summary  Patient ID: Brenda Frank MRN: [id number] DOB/AGE: Jan 01, 1971 42 y.o.  Admit date: 04/09/2013 Discharge date: 04/18/2013  Admission Diagnoses: Lumbar incisional hernia History of smoking Obesity  Discharge Diagnoses:  Active Problems:   * No active hospital problems. *   Discharged Condition: good  Hospital Course: 72 yof who had lumbar incisional hernia after back surgery.  She has stopped smoking and begun to lose weight.  This was very symptomatic.  I did an open repair with mesh which she tolerated well.  Pain control was transitioned over several days to oral medication.  She was ambulatory and discharged home doing well  Consults: None  Significant Diagnostic Studies: none  Treatments: surgery: open incisional hernia repair with mesh   Disposition: 01-Home or Self Care   Future Appointments Provider Department Dept Phone   04/19/2013 8:45 AM Emelia Loron, MD [facility] Surgery, [place] [phone]       Medication List    STOP taking these medications       ipratropium-albuterol 0.5-2.5 (3) MG/3ML Soln  Commonly known as:  DUONEB     olmesartan-hydrochlorothiazide 40-25 MG per tablet  Commonly known as:  BENICAR HCT     TUDORZA PRESSAIR 400 MCG/ACT Aepb  Generic drug:  Aclidinium Bromide      TAKE these medications       albuterol 108 (90 BASE) MCG/ACT inhaler  Commonly known as:  PROVENTIL HFA;VENTOLIN HFA  Inhale 2 puffs into the lungs every 6 (six) hours as needed. For RESCUE.     aspirin EC 81 MG tablet  Take 81 mg by mouth daily.     clonazePAM 2 MG tablet  Commonly known as:  KLONOPIN  Take 2 mg by mouth 4 (four) times daily as needed for anxiety. For anxiety     cyclobenzaprine 10 MG tablet  Commonly known as:  FLEXERIL  Take 10 mg by mouth 3 (three) times daily as needed for muscle spasms.     docusate sodium 100 MG capsule  Commonly known as:  COLACE  Take 100 mg by mouth daily as needed for  constipation.     HYDROcodone-acetaminophen 10-325 MG per tablet  Commonly known as:  NORCO  Take 1-2 tablets by mouth every 6 (six) hours as needed for pain.     lithium carbonate 300 MG CR tablet  Commonly known as:  LITHOBID  Take 600 mg by mouth 2 (two) times daily.     oxymorphone 40 MG 12 hr tablet  Commonly known as:  OPANA ER  Take 40 mg by mouth every 12 (twelve) hours.     pantoprazole 40 MG tablet  Commonly known as:  PROTONIX  Take 40 mg by mouth daily as needed (for reflux).     senna-docusate 8.6-50 MG per tablet  Commonly known as:  Senokot-S  Take 1 tablet by mouth daily as needed for constipation.           Follow-up Information   Follow up with [facility], MD In 1 week.   Specialty:  General Surgery   Contact information:   [address] [place] [address] [phone]       Signed: Emelia Loron 04/18/2013, 9:57 AM

## 2013-04-19 ENCOUNTER — Encounter (INDEPENDENT_AMBULATORY_CARE_PROVIDER_SITE_OTHER): Payer: Commercial Managed Care - PPO | Admitting: General Surgery

## 2013-04-22 ENCOUNTER — Telehealth (INDEPENDENT_AMBULATORY_CARE_PROVIDER_SITE_OTHER): Payer: Self-pay | Admitting: General Surgery

## 2013-04-22 NOTE — Telephone Encounter (Signed)
Pt's JP drain became tangled and has come out.  They have appt in the am with Dr. Dwain Sarna.  Instructed her to cover area with absorbant dressing to keep any further drainage off clothing.  She understands.

## 2013-04-23 ENCOUNTER — Ambulatory Visit (INDEPENDENT_AMBULATORY_CARE_PROVIDER_SITE_OTHER): Payer: Commercial Managed Care - PPO | Admitting: General Surgery

## 2013-04-23 ENCOUNTER — Encounter (INDEPENDENT_AMBULATORY_CARE_PROVIDER_SITE_OTHER): Payer: Self-pay | Admitting: General Surgery

## 2013-04-23 VITALS — BP 104/70 | HR 78 | Resp 18 | Ht 65.5 in | Wt 247.0 lb

## 2013-04-23 DIAGNOSIS — Z09 Encounter for follow-up examination after completed treatment for conditions other than malignant neoplasm: Secondary | ICD-10-CM

## 2013-04-23 NOTE — Progress Notes (Signed)
Subjective:     Patient ID: Brenda Frank, female   DOB: March 11, 1971, 42 y.o.   MRN: [id number]  HPI This is a 42 year old female that I did an open incisional hernia repair for a lumbar hernia. She was discharged home doing well. Subsequent to that she developed a fairly significant pneumonia that she was admitted to [facility]. This is mostly resolved though though she still is on oxygen. She returns today doing well with no real complaints about her surgery. She is having bowel movements and eating well.  Review of Systems     Objective:   Physical Exam Left flank incision healing well the staples in place, the drain has fallen out, no infection    Assessment:    Status post lumbar incisional hernia repair with mesh     Plan:     She appears to be recover from her pneumonia. She will follow up with Dr. Juanetta Gosling her pulmonologist. I took her staples out today and applied Steri-Strips. She will remain at restricted activity and I will plan on seeing her back in 4 weeks.

## 2013-05-17 ENCOUNTER — Other Ambulatory Visit (HOSPITAL_COMMUNITY): Payer: Self-pay | Admitting: Pulmonary Disease

## 2013-05-17 ENCOUNTER — Ambulatory Visit (HOSPITAL_COMMUNITY)
Admission: RE | Admit: 2013-05-17 | Discharge: 2013-05-17 | Disposition: A | Discharge status: Home / Self Care | Payer: 59 | Source: Ambulatory Visit | Attending: Pulmonary Disease | Admitting: Pulmonary Disease

## 2013-05-17 DIAGNOSIS — J449 Chronic obstructive pulmonary disease, unspecified: Secondary | ICD-10-CM

## 2013-05-17 DIAGNOSIS — J4489 Other specified chronic obstructive pulmonary disease: Secondary | ICD-10-CM | POA: Insufficient documentation

## 2013-05-17 DIAGNOSIS — R059 Cough, unspecified: Secondary | ICD-10-CM | POA: Insufficient documentation

## 2013-05-17 DIAGNOSIS — J189 Pneumonia, unspecified organism: Secondary | ICD-10-CM | POA: Insufficient documentation

## 2013-05-17 DIAGNOSIS — R05 Cough: Secondary | ICD-10-CM | POA: Insufficient documentation

## 2013-05-22 ENCOUNTER — Encounter (INDEPENDENT_AMBULATORY_CARE_PROVIDER_SITE_OTHER): Payer: Commercial Managed Care - PPO | Admitting: General Surgery

## 2013-05-30 ENCOUNTER — Encounter (INDEPENDENT_AMBULATORY_CARE_PROVIDER_SITE_OTHER): Payer: Self-pay | Admitting: General Surgery

## 2013-06-19 ENCOUNTER — Other Ambulatory Visit: Payer: Self-pay | Admitting: Neurological Surgery

## 2013-06-19 DIAGNOSIS — M549 Dorsalgia, unspecified: Secondary | ICD-10-CM

## 2013-07-04 ENCOUNTER — Other Ambulatory Visit: Payer: Self-pay

## 2013-07-04 ENCOUNTER — Ambulatory Visit
Admission: RE | Admit: 2013-07-04 | Discharge: 2013-07-04 | Disposition: A | Discharge status: Home / Self Care | Payer: 59 | Source: Ambulatory Visit | Attending: Neurological Surgery | Admitting: Neurological Surgery

## 2013-07-04 VITALS — BP 113/68 | HR 92

## 2013-07-04 DIAGNOSIS — M549 Dorsalgia, unspecified: Secondary | ICD-10-CM

## 2013-07-04 MED ORDER — IOHEXOL 180 MG/ML  SOLN
15.0000 mL | Freq: Once | INTRAMUSCULAR | Status: AC | PRN
  Administered 2013-07-04: 15 mL via INTRATHECAL

## 2013-07-04 MED ORDER — DIAZEPAM 5 MG PO TABS
10.0000 mg | ORAL_TABLET | Freq: Once | ORAL | Status: AC
  Administered 2013-07-04: 10 mg via ORAL

## 2013-07-04 MED ORDER — MEPERIDINE HCL 100 MG/ML IJ SOLN
100.0000 mg | Freq: Once | INTRAMUSCULAR | Status: AC
  Administered 2013-07-04: 100 mg via INTRAMUSCULAR

## 2013-07-04 MED ORDER — MEPERIDINE HCL 100 MG/ML IJ SOLN: 75.0000 mg | Freq: Once | INTRAMUSCULAR | Status: DC

## 2013-07-04 MED ORDER — ONDANSETRON HCL 4 MG/2ML IJ SOLN
4.0000 mg | Freq: Once | INTRAMUSCULAR | Status: AC
  Administered 2013-07-04: 4 mg via INTRAMUSCULAR

## 2013-07-04 NOTE — Progress Notes (Signed)
Spouse Anna at bedside.

## 2013-07-04 NOTE — Progress Notes (Signed)
Patient states she has been off Lithium for at least the past two days.

## 2013-07-18 ENCOUNTER — Other Ambulatory Visit: Payer: Self-pay | Admitting: Neurological Surgery

## 2013-08-19 ENCOUNTER — Other Ambulatory Visit (HOSPITAL_COMMUNITY): Payer: 59

## 2013-08-26 ENCOUNTER — Encounter (HOSPITAL_COMMUNITY): Payer: Self-pay | Admitting: Pharmacy Technician

## 2013-08-27 NOTE — Pre-Procedure Instructions (Signed)
Brenda Frank  08/27/2013   Your procedure is scheduled on:  Thursday, September 05, 2012 at 7:30 AM.   Report to [facility] Entrance "A" Admitting Office at 5:30 AM.   Call this number if you have problems the morning of surgery: [phone]   Remember:   Do not eat food or drink liquids after midnight Wednesday, 09/04/12.   Take these medicines the morning of surgery with A SIP OF WATER: lithium carbonate (LITHOBID), pantoprazole (PROTONIX), roflumilast (DALIRESP), tiotropium (SPIRIVA), OxyCODONE (OXYCONTIN), clonazePAM (KLONOPIN) - if needed, cyclobenzaprine (FLEXERIL) - if needed, albuterol (PROVENTIL HFA;VENTOLIN HFA) - if needed. Please bring inhaler with you.  Stop Aspirin as of Thursday, 08/29/13.    Do not wear jewelry, make-up or nail polish.  Do not wear lotions, powders, or perfumes. You may wear deodorant.  Do not shave 48 hours prior to surgery.   Do not bring valuables to the hospital.  [facility] is not responsible                  for any belongings or valuables.               Contacts, dentures or bridgework may not be worn into surgery.  Leave suitcase in the car. After surgery it may be brought to your room.  For patients admitted to the hospital, discharge time is determined by your                treatment team.                 Special Instructions: Shower using CHG 2 nights before surgery and the night before surgery.  If you shower the day of surgery use CHG.  Use special wash - you have one bottle of CHG for all showers.  You should use approximately 1/3 of the bottle for each shower.   Please read over the following fact sheets that you were given: Pain Booklet, Coughing and Deep Breathing, Blood Transfusion Information, Surgical Site Infection Prevention and Anesthesia Post-op Instructions

## 2013-08-28 ENCOUNTER — Encounter (HOSPITAL_COMMUNITY): Payer: Self-pay

## 2013-08-28 ENCOUNTER — Encounter (HOSPITAL_COMMUNITY)
Admission: RE | Admit: 2013-08-28 | Discharge: 2013-08-28 | Disposition: A | Discharge status: Home / Self Care | Payer: 59 | Source: Ambulatory Visit | Attending: Neurological Surgery | Admitting: Neurological Surgery

## 2013-08-28 DIAGNOSIS — Z01812 Encounter for preprocedural laboratory examination: Secondary | ICD-10-CM | POA: Insufficient documentation

## 2013-08-28 DIAGNOSIS — Z01818 Encounter for other preprocedural examination: Secondary | ICD-10-CM | POA: Insufficient documentation

## 2013-08-28 LAB — BASIC METABOLIC PANEL
BUN: 17 mg/dL (ref 6–23)
CO2: 29 mEq/L (ref 19–32)
Calcium: 9.6 mg/dL (ref 8.4–10.5)
Creatinine, Ser: 0.76 mg/dL (ref 0.50–1.10)
GFR calc non Af Amer: >90 mL/min (ref >90)
Glucose, Bld: 121 mg/dL — ABNORMAL HIGH (ref 70–99)
Sodium: 139 mEq/L (ref 137–147)

## 2013-08-28 LAB — CBC WITH DIFFERENTIAL/PLATELET
Basophils Relative: 0 % (ref 0–1)
Eosinophils Absolute: 0.1 10*3/uL (ref 0.0–0.7)
Eosinophils Relative: 1 % (ref 0–5)
HCT: 49.2 % — ABNORMAL HIGH (ref 36.0–46.0)
Hemoglobin: 15.7 g/dL — ABNORMAL HIGH (ref 12.0–15.0)
Lymphs Abs: 2.5 10*3/uL (ref 0.7–4.0)
MCH: 28.7 pg (ref 26.0–34.0)
MCHC: 31.9 g/dL (ref 30.0–36.0)
MCV: 89.9 fL (ref 78.0–100.0)
Monocytes Relative: 5 % (ref 3–12)
Platelets: 480 10*3/uL — ABNORMAL HIGH (ref 150–400)
RBC: 5.47 MIL/uL — ABNORMAL HIGH (ref 3.87–5.11)

## 2013-08-28 LAB — TYPE AND SCREEN: ABO/RH(D): A POS

## 2013-08-28 LAB — SURGICAL PCR SCREEN: Staphylococcus aureus: NEGATIVE

## 2013-08-30 NOTE — Progress Notes (Signed)
Anesthesia Chart Review:  Patient is a 43 year old female scheduled for L2-3, L3-4, L4-5 PLIF with cortical pedicle screws L2-5, iliac crest autograft.    History includes former smoker, morbid obesity, secondary polycythemia "likely due to chronic tobacco use and/or dehydration", diverticulosis, hepatic cyst, 4 mm ileocecal valve ulcer seen on colonoscopy 10/25/11 (Dr. Hildred Laser), GERD, COPD with emphysema, chronic bronchitis, asthma, HTN, diet controlled DM2, Bipolar affective disorder, skin cancer excision, cholecystectomy, L2-5 ALIF 04/2012, open lumbar incision hernia repair 8/014. CHF is listed, but she denied known history of this. 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 She was hospitalized for healthcare associated PNA (right sided) with probable sepsis 03/2013. PCP is listed as Dr. Delphina Cahill. Pulmonologist is Dr. Sinda Du.  Echo on 04/15/13 showed: Mild LVH with LVEF 16-10%, normal diastolic function. Mild left atrial enlargement. Trivial MR/TR.  EKG on 04/15/13 showed SB with marked sinus arrhythmia, non-specific inferior ST abnormality.  CXR on 08/28/13 showed: Heart size and mediastinal contours normal. Vascular pattern normal. Lungs clear except for minimal scarring or atelectasis in the lateral lingula, not significantly different from the prior study. No pleural effusions. No active cardiopulmonary disease.  PFTs on 10/13/11 showed FEV 2.07 (67% predicted). The interpretation by Dr. Sinda Du included:  1. Spirometry shows a mild ventilatory defect with evidence of airflow obstruction that is most marked in the smaller airways.  2. Lung volumes show no evidence of restrictive change but do show air trapping.  3. DLCO is mildly reduced and does correct some for volume.  4. Airway resistance appears to be increased.  5. There was no significant bronchodilator improvement.  Currently her COPD meds include Proventil  MDI/nebs PRN, Daliresp, and Spiriva.  Preoperative labs noted.  H/H 15.7/49.2 which appears around her preoperative baseline. PLT count 480K.   She is now several months out from her hospitalization for PNA.  CXR this week showed no active disease.  She will be further evaluated by her assigned anesthesiologist on the day of surgery.  If not acute cardiopulmonary issues then I would anticipate that she could proceed as planned.  [facility] Short Stay Center/Anesthesiology Phone [phone] 08/30/2013 1:34 PM

## 2013-09-02 ENCOUNTER — Ambulatory Visit (INDEPENDENT_AMBULATORY_CARE_PROVIDER_SITE_OTHER): Payer: 59 | Admitting: Internal Medicine

## 2013-09-04 MED ORDER — DEXAMETHASONE SODIUM PHOSPHATE 10 MG/ML IJ SOLN
10.0000 mg | INTRAMUSCULAR | Status: AC
Start: 2013-09-05 — End: 2013-09-05
  Administered 2013-09-05: 10 mg via INTRAVENOUS
  Filled 2013-09-04: qty 1

## 2013-09-04 MED ORDER — CEFAZOLIN SODIUM-DEXTROSE 2-3 GM-% IV SOLR
2.0000 g | INTRAVENOUS | Status: AC
  Administered 2013-09-05: 2 g via INTRAVENOUS
  Filled 2013-09-04: qty 50

## 2013-09-05 ENCOUNTER — Encounter (HOSPITAL_COMMUNITY): Payer: Self-pay | Admitting: *Deleted

## 2013-09-05 ENCOUNTER — Ambulatory Visit (HOSPITAL_COMMUNITY): Payer: 59

## 2013-09-05 ENCOUNTER — Encounter (HOSPITAL_COMMUNITY): Payer: 59 | Admitting: Vascular Surgery

## 2013-09-05 ENCOUNTER — Ambulatory Visit (HOSPITAL_COMMUNITY): Payer: 59 | Admitting: Anesthesiology

## 2013-09-05 ENCOUNTER — Encounter (HOSPITAL_COMMUNITY)
Admission: RE | Disposition: A | Discharge status: Home / Self Care | Payer: Self-pay | Source: Ambulatory Visit | Attending: Neurological Surgery

## 2013-09-05 ENCOUNTER — Inpatient Hospital Stay (HOSPITAL_COMMUNITY)
Admission: RE | Admit: 2013-09-05 | Discharge: 2013-09-06 | DRG: 460 | Disposition: A | Discharge status: Home / Self Care | Payer: 59 | Source: Ambulatory Visit | Attending: Neurological Surgery | Admitting: Neurological Surgery

## 2013-09-05 DIAGNOSIS — Z7982 Long term (current) use of aspirin: Secondary | ICD-10-CM

## 2013-09-05 DIAGNOSIS — Z79899 Other long term (current) drug therapy: Secondary | ICD-10-CM

## 2013-09-05 DIAGNOSIS — J449 Chronic obstructive pulmonary disease, unspecified: Secondary | ICD-10-CM | POA: Diagnosis present

## 2013-09-05 DIAGNOSIS — E119 Type 2 diabetes mellitus without complications: Secondary | ICD-10-CM | POA: Diagnosis present

## 2013-09-05 DIAGNOSIS — Z87891 Personal history of nicotine dependence: Secondary | ICD-10-CM

## 2013-09-05 DIAGNOSIS — I10 Essential (primary) hypertension: Secondary | ICD-10-CM | POA: Diagnosis present

## 2013-09-05 DIAGNOSIS — G8929 Other chronic pain: Secondary | ICD-10-CM | POA: Diagnosis present

## 2013-09-05 DIAGNOSIS — Z981 Arthrodesis status: Secondary | ICD-10-CM

## 2013-09-05 DIAGNOSIS — K219 Gastro-esophageal reflux disease without esophagitis: Secondary | ICD-10-CM | POA: Diagnosis present

## 2013-09-05 DIAGNOSIS — Z889 Allergy status to unspecified drugs, medicaments and biological substances status: Secondary | ICD-10-CM

## 2013-09-05 DIAGNOSIS — T84498A Other mechanical complication of other internal orthopedic devices, implants and grafts, initial encounter: Principal | ICD-10-CM | POA: Diagnosis present

## 2013-09-05 DIAGNOSIS — Z85828 Personal history of other malignant neoplasm of skin: Secondary | ICD-10-CM

## 2013-09-05 DIAGNOSIS — J4489 Other specified chronic obstructive pulmonary disease: Secondary | ICD-10-CM | POA: Diagnosis present

## 2013-09-05 DIAGNOSIS — I509 Heart failure, unspecified: Secondary | ICD-10-CM | POA: Diagnosis present

## 2013-09-05 DIAGNOSIS — Z808 Family history of malignant neoplasm of other organs or systems: Secondary | ICD-10-CM

## 2013-09-05 DIAGNOSIS — F319 Bipolar disorder, unspecified: Secondary | ICD-10-CM | POA: Diagnosis present

## 2013-09-05 DIAGNOSIS — K279 Peptic ulcer, site unspecified, unspecified as acute or chronic, without hemorrhage or perforation: Secondary | ICD-10-CM | POA: Diagnosis present

## 2013-09-05 DIAGNOSIS — Y838 Other surgical procedures as the cause of abnormal reaction of the patient, or of later complication, without mention of misadventure at the time of the procedure: Secondary | ICD-10-CM | POA: Diagnosis present

## 2013-09-05 LAB — GLUCOSE, CAPILLARY
GLUCOSE-CAPILLARY: 112 mg/dL — AB (ref 70–99)
Glucose-Capillary: 126 mg/dL — ABNORMAL HIGH (ref 70–99)

## 2013-09-05 SURGERY — POSTERIOR LUMBAR FUSION 3 LEVEL
Anesthesia: General | Site: Back

## 2013-09-05 MED ORDER — HYDROMORPHONE HCL PF 1 MG/ML IJ SOLN
0.2500 mg | INTRAMUSCULAR | Status: DC | PRN
  Administered 2013-09-05 (×4): 0.5 mg via INTRAVENOUS

## 2013-09-05 MED ORDER — POTASSIUM CHLORIDE IN NACL 20-0.9 MEQ/L-% IV SOLN
INTRAVENOUS | Status: DC
  Filled 2013-09-05 (×3): qty 1000

## 2013-09-05 MED ORDER — ONDANSETRON HCL 4 MG/2ML IJ SOLN
4.0000 mg | Freq: Four times a day (QID) | INTRAMUSCULAR | Status: DC | PRN
Start: 2013-09-05 — End: 2013-09-05

## 2013-09-05 MED ORDER — SODIUM CHLORIDE 0.9 % IJ SOLN
3.0000 mL | Freq: Two times a day (BID) | INTRAMUSCULAR | Status: DC
  Administered 2013-09-05: 3 mL via INTRAVENOUS

## 2013-09-05 MED ORDER — VECURONIUM BROMIDE 10 MG IV SOLR
INTRAVENOUS | Status: DC | PRN
  Administered 2013-09-05 (×2): 1 mg via INTRAVENOUS
  Administered 2013-09-05: 2 mg via INTRAVENOUS

## 2013-09-05 MED ORDER — SODIUM CHLORIDE 0.9 % IJ SOLN
3.0000 mL | INTRAMUSCULAR | Status: DC | PRN
Start: 2013-09-05 — End: 2013-09-06

## 2013-09-05 MED ORDER — DIPHENHYDRAMINE HCL 50 MG/ML IJ SOLN: 12.5000 mg | Freq: Four times a day (QID) | INTRAMUSCULAR | Status: DC | PRN

## 2013-09-05 MED ORDER — LACTATED RINGERS IV SOLN
INTRAVENOUS | Status: DC | PRN
  Administered 2013-09-05 (×3): via INTRAVENOUS

## 2013-09-05 MED ORDER — PHENOL 1.4 % MT LIQD: 1.0000 | OROMUCOSAL | Status: DC | PRN

## 2013-09-05 MED ORDER — NEOSTIGMINE METHYLSULFATE 1 MG/ML IJ SOLN
INTRAMUSCULAR | Status: DC | PRN
  Administered 2013-09-05: 3 mg via INTRAVENOUS

## 2013-09-05 MED ORDER — ALBUTEROL SULFATE (2.5 MG/3ML) 0.083% IN NEBU: 2.5000 mg | INHALATION_SOLUTION | Freq: Four times a day (QID) | RESPIRATORY_TRACT | Status: DC | PRN

## 2013-09-05 MED ORDER — HYDROCODONE-ACETAMINOPHEN 10-325 MG PO TABS
1.0000 | ORAL_TABLET | Freq: Four times a day (QID) | ORAL | Status: DC | PRN
  Administered 2013-09-05 – 2013-09-06 (×3): 2 via ORAL
  Filled 2013-09-05 (×3): qty 2

## 2013-09-05 MED ORDER — HYDROMORPHONE HCL PF 1 MG/ML IJ SOLN
0.5000 mg | INTRAMUSCULAR | Status: DC | PRN
  Administered 2013-09-05 – 2013-09-06 (×3): 1 mg via INTRAVENOUS
  Filled 2013-09-05 (×3): qty 1

## 2013-09-05 MED ORDER — THROMBIN 5000 UNITS EX SOLR
OROMUCOSAL | Status: DC | PRN
  Administered 2013-09-05 (×2): via TOPICAL

## 2013-09-05 MED ORDER — ALBUTEROL SULFATE HFA 108 (90 BASE) MCG/ACT IN AERS
2.0000 | INHALATION_SPRAY | Freq: Four times a day (QID) | RESPIRATORY_TRACT | Status: DC | PRN
Start: 2013-09-05 — End: 2013-09-05

## 2013-09-05 MED ORDER — CLONAZEPAM 0.5 MG PO TABS
2.0000 mg | ORAL_TABLET | Freq: Four times a day (QID) | ORAL | Status: DC | PRN
  Administered 2013-09-06: 2 mg via ORAL
  Filled 2013-09-05: qty 4

## 2013-09-05 MED ORDER — LITHIUM CARBONATE ER 300 MG PO TBCR
600.0000 mg | EXTENDED_RELEASE_TABLET | Freq: Two times a day (BID) | ORAL | Status: DC
  Administered 2013-09-05 – 2013-09-06 (×2): 600 mg via ORAL
  Filled 2013-09-05 (×3): qty 2

## 2013-09-05 MED ORDER — OXYCODONE HCL ER 15 MG PO T12A
40.0000 mg | EXTENDED_RELEASE_TABLET | Freq: Two times a day (BID) | ORAL | Status: DC
  Administered 2013-09-05 – 2013-09-06 (×2): 40 mg via ORAL
  Filled 2013-09-05 (×2): qty 1
  Filled 2013-09-05 (×2): qty 2

## 2013-09-05 MED ORDER — ASPIRIN EC 81 MG PO TBEC
81.0000 mg | DELAYED_RELEASE_TABLET | Freq: Every day | ORAL | Status: DC
  Administered 2013-09-05 – 2013-09-06 (×2): 81 mg via ORAL
  Filled 2013-09-05 (×2): qty 1

## 2013-09-05 MED ORDER — ALBUMIN HUMAN 5 % IV SOLN
INTRAVENOUS | Status: DC | PRN
  Administered 2013-09-05: 10:00:00 via INTRAVENOUS

## 2013-09-05 MED ORDER — OXYCODONE HCL 5 MG/5ML PO SOLN: 5.0000 mg | Freq: Once | ORAL | Status: AC | PRN

## 2013-09-05 MED ORDER — LIDOCAINE HCL (CARDIAC) 20 MG/ML IV SOLN
INTRAVENOUS | Status: DC | PRN
  Administered 2013-09-05: 100 mg via INTRAVENOUS

## 2013-09-05 MED ORDER — DIPHENHYDRAMINE HCL 12.5 MG/5ML PO ELIX
12.5000 mg | ORAL_SOLUTION | Freq: Four times a day (QID) | ORAL | Status: DC | PRN
  Filled 2013-09-05: qty 5

## 2013-09-05 MED ORDER — MENTHOL 3 MG MT LOZG
1.0000 | LOZENGE | OROMUCOSAL | Status: DC | PRN
Start: 2013-09-05 — End: 2013-09-06

## 2013-09-05 MED ORDER — OXYCODONE HCL 5 MG PO TABS
ORAL_TABLET | ORAL | Status: AC
  Filled 2013-09-05: qty 1

## 2013-09-05 MED ORDER — POTASSIUM CHLORIDE ER 10 MEQ PO TBCR
10.0000 meq | EXTENDED_RELEASE_TABLET | Freq: Every day | ORAL | Status: DC | PRN
  Filled 2013-09-05: qty 1

## 2013-09-05 MED ORDER — PROPOFOL 10 MG/ML IV BOLUS
INTRAVENOUS | Status: DC | PRN
  Administered 2013-09-05: 250 mg via INTRAVENOUS

## 2013-09-05 MED ORDER — ONDANSETRON HCL 4 MG/2ML IJ SOLN
INTRAMUSCULAR | Status: DC | PRN
  Administered 2013-09-05: 4 mg via INTRAVENOUS

## 2013-09-05 MED ORDER — ACETAMINOPHEN 650 MG RE SUPP: 650.0000 mg | RECTAL | Status: DC | PRN

## 2013-09-05 MED ORDER — INFLUENZA VAC SPLIT QUAD 0.5 ML IM SUSP
0.5000 mL | INTRAMUSCULAR | Status: AC
  Administered 2013-09-06: 0.5 mL via INTRAMUSCULAR
  Filled 2013-09-05: qty 0.5

## 2013-09-05 MED ORDER — ACETAMINOPHEN 10 MG/ML IV SOLN
1000.0000 mg | Freq: Once | INTRAVENOUS | Status: AC
  Administered 2013-09-05: 1000 mg via INTRAVENOUS

## 2013-09-05 MED ORDER — FENTANYL CITRATE 0.05 MG/ML IJ SOLN
INTRAMUSCULAR | Status: DC | PRN
  Administered 2013-09-05 (×4): 50 ug via INTRAVENOUS
  Administered 2013-09-05: 100 ug via INTRAVENOUS
  Administered 2013-09-05: 150 ug via INTRAVENOUS
  Administered 2013-09-05: 50 ug via INTRAVENOUS

## 2013-09-05 MED ORDER — OXYCODONE HCL 5 MG PO TABS
5.0000 mg | ORAL_TABLET | Freq: Once | ORAL | Status: AC | PRN
  Administered 2013-09-05: 5 mg via ORAL

## 2013-09-05 MED ORDER — ASENAPINE MALEATE 5 MG SL SUBL
5.0000 mg | SUBLINGUAL_TABLET | Freq: Every day | SUBLINGUAL | Status: DC
  Administered 2013-09-05: 5 mg via SUBLINGUAL
  Filled 2013-09-05 (×2): qty 1

## 2013-09-05 MED ORDER — THROMBIN 20000 UNITS EX SOLR
CUTANEOUS | Status: DC | PRN
  Administered 2013-09-05: 08:00:00 via TOPICAL

## 2013-09-05 MED ORDER — NALOXONE HCL 0.4 MG/ML IJ SOLN: 0.4000 mg | INTRAMUSCULAR | Status: DC | PRN

## 2013-09-05 MED ORDER — FUROSEMIDE 20 MG PO TABS
20.0000 mg | ORAL_TABLET | Freq: Every day | ORAL | Status: DC | PRN
  Filled 2013-09-05: qty 1

## 2013-09-05 MED ORDER — CEFAZOLIN SODIUM 1-5 GM-% IV SOLN
1.0000 g | Freq: Three times a day (TID) | INTRAVENOUS | Status: AC
  Administered 2013-09-05 (×2): 1 g via INTRAVENOUS
  Filled 2013-09-05 (×2): qty 50

## 2013-09-05 MED ORDER — ONDANSETRON HCL 4 MG/2ML IJ SOLN
4.0000 mg | INTRAMUSCULAR | Status: DC | PRN
  Administered 2013-09-06: 4 mg via INTRAVENOUS
  Filled 2013-09-05: qty 2

## 2013-09-05 MED ORDER — HYDROMORPHONE 0.3 MG/ML IV SOLN
INTRAVENOUS | Status: DC
  Administered 2013-09-05: 2.4 mg via INTRAVENOUS

## 2013-09-05 MED ORDER — PANTOPRAZOLE SODIUM 40 MG PO TBEC: 40.0000 mg | DELAYED_RELEASE_TABLET | Freq: Every day | ORAL | Status: DC | PRN

## 2013-09-05 MED ORDER — ACETAMINOPHEN 325 MG PO TABS: 650.0000 mg | ORAL_TABLET | ORAL | Status: DC | PRN

## 2013-09-05 MED ORDER — SODIUM CHLORIDE 0.9 % IR SOLN
Status: DC | PRN
  Administered 2013-09-05: 08:00:00

## 2013-09-05 MED ORDER — BUPIVACAINE HCL (PF) 0.25 % IJ SOLN
INTRAMUSCULAR | Status: DC | PRN
  Administered 2013-09-05: 10 mL

## 2013-09-05 MED ORDER — ROFLUMILAST 500 MCG PO TABS
500.0000 ug | ORAL_TABLET | Freq: Every day | ORAL | Status: DC
  Administered 2013-09-05 – 2013-09-06 (×2): 500 ug via ORAL
  Filled 2013-09-05 (×2): qty 1

## 2013-09-05 MED ORDER — CYCLOBENZAPRINE HCL 10 MG PO TABS
10.0000 mg | ORAL_TABLET | Freq: Three times a day (TID) | ORAL | Status: DC | PRN
  Administered 2013-09-05 – 2013-09-06 (×2): 10 mg via ORAL
  Filled 2013-09-05 (×2): qty 1

## 2013-09-05 MED ORDER — ACETAMINOPHEN 10 MG/ML IV SOLN
INTRAVENOUS | Status: AC
  Filled 2013-09-05: qty 100

## 2013-09-05 MED ORDER — GLYCOPYRROLATE 0.2 MG/ML IJ SOLN
INTRAMUSCULAR | Status: DC | PRN
  Administered 2013-09-05: 0.4 mg via INTRAVENOUS

## 2013-09-05 MED ORDER — SODIUM CHLORIDE 0.9 % IV SOLN: 250.0000 mL | INTRAVENOUS | Status: DC

## 2013-09-05 MED ORDER — HYDROMORPHONE HCL PF 1 MG/ML IJ SOLN
INTRAMUSCULAR | Status: AC
  Filled 2013-09-05: qty 1

## 2013-09-05 MED ORDER — SODIUM CHLORIDE 0.9 % IJ SOLN: 9.0000 mL | INTRAMUSCULAR | Status: DC | PRN

## 2013-09-05 MED ORDER — TIOTROPIUM BROMIDE MONOHYDRATE 18 MCG IN CAPS
18.0000 ug | ORAL_CAPSULE | Freq: Every day | RESPIRATORY_TRACT | Status: DC
  Administered 2013-09-05 – 2013-09-06 (×2): 18 ug via RESPIRATORY_TRACT
  Filled 2013-09-05: qty 5

## 2013-09-05 MED ORDER — 0.9 % SODIUM CHLORIDE (POUR BTL) OPTIME
TOPICAL | Status: DC | PRN
  Administered 2013-09-05: 1000 mL

## 2013-09-05 MED ORDER — ARTIFICIAL TEARS OP OINT
TOPICAL_OINTMENT | OPHTHALMIC | Status: DC | PRN
  Administered 2013-09-05: 1 via OPHTHALMIC

## 2013-09-05 MED ORDER — MIDAZOLAM HCL 5 MG/5ML IJ SOLN
INTRAMUSCULAR | Status: DC | PRN
Start: 2013-09-05 — End: 2013-09-05
  Administered 2013-09-05: 2 mg via INTRAVENOUS

## 2013-09-05 MED ORDER — HYDROMORPHONE 0.3 MG/ML IV SOLN
INTRAVENOUS | Status: AC
  Administered 2013-09-05: 13:00:00
  Filled 2013-09-05: qty 25

## 2013-09-05 MED ORDER — METOCLOPRAMIDE HCL 5 MG/ML IJ SOLN: 10.0000 mg | Freq: Once | INTRAMUSCULAR | Status: DC | PRN

## 2013-09-05 MED ORDER — ROCURONIUM BROMIDE 100 MG/10ML IV SOLN
INTRAVENOUS | Status: DC | PRN
  Administered 2013-09-05: 50 mg via INTRAVENOUS

## 2013-09-05 SURGICAL SUPPLY — 60 items
BAG DECANTER FOR FLEXI CONT (MISCELLANEOUS) ×2 IMPLANT
BENZOIN TINCTURE PRP APPL 2/3 (GAUZE/BANDAGES/DRESSINGS) ×2 IMPLANT
BLADE SURG ROTATE 9660 (MISCELLANEOUS) IMPLANT
BUR MATCHSTICK NEURO 3.0 LAGG (BURR) ×2 IMPLANT
CANISTER SUCT 3000ML (MISCELLANEOUS) ×2 IMPLANT
CONT SPEC 4OZ CLIKSEAL STRL BL (MISCELLANEOUS) ×4 IMPLANT
COVER BACK TABLE 24X17X13 BIG (DRAPES) IMPLANT
COVER TABLE BACK 60X90 (DRAPES) ×2 IMPLANT
DRAPE C-ARM 42X72 X-RAY (DRAPES) ×4 IMPLANT
DRAPE LAPAROTOMY 100X72X124 (DRAPES) ×2 IMPLANT
DRAPE POUCH INSTRU U-SHP 10X18 (DRAPES) ×2 IMPLANT
DRAPE SURG 17X23 STRL (DRAPES) ×2 IMPLANT
DRESSING TELFA 8X3 (GAUZE/BANDAGES/DRESSINGS) IMPLANT
DRSG OPSITE 4X5.5 SM (GAUZE/BANDAGES/DRESSINGS) ×2 IMPLANT
DRSG OPSITE POSTOP 4X8 (GAUZE/BANDAGES/DRESSINGS) ×2 IMPLANT
DURAPREP 26ML APPLICATOR (WOUND CARE) ×2 IMPLANT
ELECT REM PT RETURN 9FT ADLT (ELECTROSURGICAL) ×2
ELECTRODE REM PT RTRN 9FT ADLT (ELECTROSURGICAL) ×1 IMPLANT
EVACUATOR 1/8 PVC DRAIN (DRAIN) ×2 IMPLANT
GAUZE SPONGE 4X4 16PLY XRAY LF (GAUZE/BANDAGES/DRESSINGS) IMPLANT
GLOVE BIO SURGEON STRL SZ8 (GLOVE) ×4 IMPLANT
GLOVE ECLIPSE 8.0 STRL XLNG CF (GLOVE) ×2 IMPLANT
GLOVE INDICATOR 7.5 STRL GRN (GLOVE) ×2 IMPLANT
GLOVE SURG SS PI 7.0 STRL IVOR (GLOVE) ×6 IMPLANT
GOWN BRE IMP SLV AUR LG STRL (GOWN DISPOSABLE) IMPLANT
GOWN BRE IMP SLV AUR XL STRL (GOWN DISPOSABLE) IMPLANT
GOWN STRL REIN 2XL LVL4 (GOWN DISPOSABLE) IMPLANT
GOWN STRL REUS W/ TWL LRG LVL3 (GOWN DISPOSABLE) ×2 IMPLANT
GOWN STRL REUS W/ TWL XL LVL3 (GOWN DISPOSABLE) ×2 IMPLANT
GOWN STRL REUS W/TWL LRG LVL3 (GOWN DISPOSABLE) ×2
GOWN STRL REUS W/TWL XL LVL3 (GOWN DISPOSABLE) ×2
HEMOSTAT POWDER KIT SURGIFOAM (HEMOSTASIS) ×4 IMPLANT
KIT BASIN OR (CUSTOM PROCEDURE TRAY) ×2 IMPLANT
KIT INFUSE SMALL (Orthopedic Implant) ×2 IMPLANT
KIT ROOM TURNOVER OR (KITS) ×2 IMPLANT
NEEDLE HYPO 25X1 1.5 SAFETY (NEEDLE) ×2 IMPLANT
NS IRRIG 1000ML POUR BTL (IV SOLUTION) ×2 IMPLANT
PACK LAMINECTOMY NEURO (CUSTOM PROCEDURE TRAY) ×2 IMPLANT
PAD ARMBOARD 7.5X6 YLW CONV (MISCELLANEOUS) ×6 IMPLANT
ROD FIXATION MAS PREBENT1 110M (Rod) ×2 IMPLANT
SCREW LOCK (Screw) ×4 IMPLANT
SCREW LOCK FXNS SPNE MAS PL (Screw) ×4 IMPLANT
SCREW SHANK 5.0X30MM (Screw) ×4 IMPLANT
SCREW SHANK 5.0X35 (Screw) ×4 IMPLANT
SCREW TULIP 5.5 (Screw) ×8 IMPLANT
SHEET CONFORM 45LX20WX7H (Bone Implant) ×4 IMPLANT
SPONGE GAUZE 4X4 12PLY (GAUZE/BANDAGES/DRESSINGS) ×2 IMPLANT
SPONGE LAP 4X18 X RAY DECT (DISPOSABLE) IMPLANT
SPONGE SURGIFOAM ABS GEL 100 (HEMOSTASIS) ×4 IMPLANT
STRIP CLOSURE SKIN 1/2X4 (GAUZE/BANDAGES/DRESSINGS) ×2 IMPLANT
SUT VIC AB 0 CT1 18XCR BRD8 (SUTURE) ×2 IMPLANT
SUT VIC AB 0 CT1 8-18 (SUTURE) ×2
SUT VIC AB 2-0 CP2 18 (SUTURE) ×4 IMPLANT
SUT VIC AB 3-0 SH 8-18 (SUTURE) ×4 IMPLANT
SYR 20ML ECCENTRIC (SYRINGE) ×2 IMPLANT
TOWEL OR 17X24 6PK STRL BLUE (TOWEL DISPOSABLE) ×2 IMPLANT
TOWEL OR 17X26 10 PK STRL BLUE (TOWEL DISPOSABLE) ×2 IMPLANT
TRAP SPECIMEN MUCOUS 40CC (MISCELLANEOUS) ×2 IMPLANT
TRAY FOLEY CATH 14FRSI W/METER (CATHETERS) ×2 IMPLANT
WATER STERILE IRR 1000ML POUR (IV SOLUTION) ×2 IMPLANT

## 2013-09-05 NOTE — Addendum Note (Signed)
Addendum created 09/05/13 1432 by Jenne Campus, CRNA   Modules edited: Anesthesia Flowsheet, Anesthesia LDA

## 2013-09-05 NOTE — Anesthesia Procedure Notes (Signed)
Procedure Name: Intubation Date/Time: 09/05/2013 7:56 AM Performed by: Jenne Campus Pre-anesthesia Checklist: Patient identified, Emergency Drugs available, Suction available, Patient being monitored and Timeout performed Patient Re-evaluated:Patient Re-evaluated prior to inductionOxygen Delivery Method: Circle system utilized Preoxygenation: Pre-oxygenation with 100% oxygen Intubation Type: IV induction Ventilation: Mask ventilation without difficulty and Oral airway inserted - appropriate to patient size Laryngoscope Size: Miller and 2 Grade View: Grade I Tube type: Oral Tube size: 7.5 mm Number of attempts: 1 Airway Equipment and Method: Stylet Placement Confirmation: ETT inserted through vocal cords under direct vision,  positive ETCO2,  CO2 detector and breath sounds checked- equal and bilateral Secured at: 21 cm Tube secured with: Tape Dental Injury: Teeth and Oropharynx as per pre-operative assessment

## 2013-09-05 NOTE — Anesthesia Preprocedure Evaluation (Addendum)
Anesthesia Evaluation  Patient identified by MRN, date of birth, ID band Patient awake    Reviewed: Allergy & Precautions, H&P , NPO status , Patient's Chart, lab work & pertinent test results, reviewed documented beta blocker date and time   History of Anesthesia Complications Negative for: history of anesthetic complications  Airway Mallampati: II TM Distance: >3 FB Neck ROM: full    Dental  (+) Edentulous Upper, Edentulous Lower and Dental Advisory Given   Pulmonary shortness of breath and with exertion, asthma , pneumonia -, resolved, COPD COPD inhaler, former smoker,  breath sounds clear to auscultation        Cardiovascular hypertension, - CHF Rhythm:regular     Neuro/Psych PSYCHIATRIC DISORDERS Anxiety Bipolar Disorder negative neurological ROS  negative psych ROS   GI/Hepatic Neg liver ROS, PUD, GERD-  Medicated and Controlled,  Endo/Other  negative endocrine ROSdiabetes, Type 2Morbid obesityDiet controlled  Renal/GU negative Renal ROS  negative genitourinary   Musculoskeletal   Abdominal   Peds  Hematology negative hematology ROS (+)   Anesthesia Other Findings See surgeon's H&P   Reproductive/Obstetrics negative OB ROS                          Anesthesia Physical Anesthesia Plan  ASA: III  Anesthesia Plan: General   Post-op Pain Management:    Induction: Intravenous  Airway Management Planned: Oral ETT  Additional Equipment:   Intra-op Plan:   Post-operative Plan: Extubation in OR  Informed Consent: I have reviewed the patients History and Physical, chart, labs and discussed the procedure including the risks, benefits and alternatives for the proposed anesthesia with the patient or authorized representative who has indicated his/her understanding and acceptance.   Dental Advisory Given  Plan Discussed with: CRNA and Surgeon  Anesthesia Plan Comments:          Anesthesia Quick Evaluation

## 2013-09-05 NOTE — Anesthesia Postprocedure Evaluation (Signed)
Anesthesia Post Note  Patient: Brenda Frank  Procedure(s) Performed: Procedure(s) (LRB): POSTERIOR LUMBAR FUSION LUMBAR TWO-THREE,LUMBAR THREE-FOUR LUMBAR FOUR-FIVE POSTERIOR LATERAL FUSION WITH CORTICAL PEDICLE SCREWS LUMBAR TWO TO FIVE ILIAC CREST AUTOGRAFT (N/A)  Anesthesia type: General  Patient location: PACU  Post pain: Pain level controlled  Post assessment: Patient's Cardiovascular Status Stable  Last Vitals:  Filed Vitals:   09/05/13 1239  BP:   Pulse:   Temp:   Resp: 12    Post vital signs: Reviewed and stable  Level of consciousness: alert  Complications: No apparent anesthesia complications

## 2013-09-05 NOTE — Progress Notes (Signed)
Lunch relief by MA Laterra Lubinski RN 

## 2013-09-05 NOTE — Transfer of Care (Signed)
Immediate Anesthesia Transfer of Care Note  Patient: Brenda Frank  Procedure(s) Performed: Procedure(s): POSTERIOR LUMBAR FUSION LUMBAR TWO-THREE,LUMBAR THREE-FOUR LUMBAR FOUR-FIVE POSTERIOR LATERAL FUSION WITH CORTICAL PEDICLE SCREWS LUMBAR TWO TO FIVE ILIAC CREST AUTOGRAFT (N/A)  Patient Location: PACU  Anesthesia Type:General  Level of Consciousness: awake, oriented and patient cooperative  Airway & Oxygen Therapy: Patient Spontanous Breathing and Patient connected to face mask oxygen  Post-op Assessment: Report given to PACU RN and Post -op Vital signs reviewed and stable  Post vital signs: Reviewed  Complications: No apparent anesthesia complications

## 2013-09-05 NOTE — Op Note (Signed)
09/05/2013  11:40 AM  PATIENT:  Brenda Frank  43 y.o. 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 Posterior fixation L2-L5 inclusive on the right using cortical pedicle screws.  2. Intertransverse arthrodesis L2-L5 using morcellized autograft obtained through a separate fascial incision from the right iliac crest and allograft.  SURGEON:  Sherley Bounds, MD  ASSISTANTS: Dr. Hal Neer  ANESTHESIA:  General  EBL: 700 ml  Total I/O In: 0086 [I.V.:2000; IV Piggyback:250] Out: 800 [Urine:100; Blood:700]  BLOOD ADMINISTERED:none  DRAINS: Hemovac   INDICATION FOR PROCEDURE: This patient underwent a three-level XLIF year and a half ago. She presented with pain. CT scan showed a pseudoarthrosis at all 3 levels. I recommended a redo fusion from L2-L5. Patient understood the risks, benefits, and alternatives and potential outcomes and wished to proceed.  PROCEDURE DETAILS:  The patient was brought to the operating room. After induction of generalized endotracheal anesthesia the patient was rolled into the prone position on chest rolls and all pressure points were padded. The patient's lumbar region was cleaned and then prepped with DuraPrep and draped in the usual sterile fashion. Anesthesia was injected and then a dorsal midline incision was made and carried down to the lumbosacral fascia. The fascia was opened and the paraspinous musculature was taken down in a subperiosteal fashion to expose L2-L5. A self-retaining retractor was placed. Intraoperative fluoroscopy confirmed my level, and I started with placement of the cortical pedicle screws from L2-L5 on the right. The pedicle screw entry zones were identified utilizing surface landmarks and  AP and lateral fluoroscopy. I scored the cortex with the high-speed drill and then used the hand drill to drill in an upward and outward direction into the pedicle. I  then tapped line to line. I then placed a 5-0 by 30 mm cortical pedicle screw into the pedicles of L2-L5 on the right (inclusive).  Then exposed the transverse processes from L2-L5 for arthrodesis. Dissected in the suprafascial plane identified the right iliac crest. A separate fascial incision was made and then I used osteotomes and curettes to obtain morcellized iliac crest autograft. The wound was irrigated with saline solution containing bacitracin and the bleeding from the iliac rest was controlled with Surgifoam and with dry Gelfoam. Then closed the fascia over the iliac crest. We then decorticated the transverse processes and laid a mixture of morcellized autograft and allograft out over these to perform intertransverse arthrodesis at L2-L5 on the right. We then placed lordotic rods into the multiaxial screw heads of the pedicle screws and locked these in position with the locking caps and anti-torque device. We then checked our construct with AP and lateral fluoroscopy. Irrigated with copious amounts of bacitracin-containing saline solution. Placed a medium Hemovac drain through separate stab incision. I closed the muscle and the fascia with 0 Vicryl. Closed the subcutaneous tissues with 2-0 Vicryl and subcuticular tissues with 3-0 Vicryl. The skin was closed with benzoin and Steri-Strips. Dressing was then applied, the patient was awakened from general anesthesia and transported to the recovery room in stable condition. At the end of the procedure all sponge, needle and instrument counts were correct.   PLAN OF CARE: Admit to inpatient   PATIENT DISPOSITION:  PACU - hemodynamically stable.   Delay start of Pharmacological VTE agent (>24hrs) due to surgical blood loss or risk of bleeding:  yes

## 2013-09-05 NOTE — Progress Notes (Signed)
Utilization review completed.  

## 2013-09-05 NOTE — H&P (Signed)
Subjective: Patient is a 43 y.o. female admitted for re-do fusion L2-5 s/p XLIF L2-3, L3-4, L4-5. Onset of symptoms was 1 year ago, gradually worsening since that time.  The pain is rated severe, and is located at the across the lower back. The pain is described as aching and occurs intermittently. The symptoms have been progressive. Symptoms are exacerbated by exercise. MRI or CT showed pseudoarthrosis L2-5.   Past Medical History  Diagnosis Date  . Diverticulosis   . 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 Polycythemia 10/25/2011    Query secondary to smoking or hypoxia. Hemoglobin 16-17 g  . Cecal ulcer 10/26/2011    Per colonoscopy  . Diverticulosis 10/26/2011    Per colonoscopy  . GERD (gastroesophageal reflux disease)   . Hernia, inguinal     rt  . Disorder of vocal cords     done  when intubated for hysterectomy  2008  . Chronic bronchitis 05/03/2012    "gets it 1-2X q yr"  . COPD with emphysema   . Pneumonia 2007  . Exertional dyspnea     "because of the COPD"  . History of duodenal ulcer   . Chronic lower back pain   . Bipolar affective     "under control last 12 years" (05/03/2012)  . Skin cancer of face     "had it taken off"  . Asthma   . Emphysema of lung   . Cough   . Wheezing   . Diabetes mellitus     no rx for 3-4 yrs  lost weight; "diet controlled" (05/03/2012)  . Hypertension     12/14 not taking any meds at this time  . CHF (congestive heart failure)     pt states she has never had this.     Past Surgical History  Procedure Laterality Date  . Colonoscopy  10/25/2011    Procedure: COLONOSCOPY;  Surgeon: Rogene Houston, MD;  Location: [facility];  Service: Endoscopy;  Laterality: N/A;  . Lateral / posterior combined fusion lumbar spine  05/03/2012  . Bilateral oophorectomy  2012  . Appendectomy  2000's  . Skin cancer excision      facial  . Anterior lat lumbar fusion  05/03/2012    Procedure: ANTERIOR LATERAL LUMBAR FUSION 3 LEVELS;  Surgeon: Eustace Moore, MD;  Location: [facility];  Service: Neurosurgery;  Laterality: Left;  Left lumbar two-three, lumbar three-four, lumbar four-five anteriolateral retroperitoneal interbody fusion with pedicle screws  . Abdominal hysterectomy  10/2006  . Hernia repair  03/2008    "2 abdominal hernias repaired"  . Cholecystectomy  05/2007  . Back surgery    . Incisional hernia repair Left 04/09/2013    Procedure: HERNIA REPAIR INCISIONAL;  Surgeon: Rolm Bookbinder, MD;  Location: [facility];  Service: General;  Laterality: Left;  . Insertion of mesh Left 04/09/2013    Procedure: INSERTION OF MESH;  Surgeon: Rolm Bookbinder, MD;  Location: [facility];  Service: General;  Laterality: Left;    Prior to Admission medications   Medication Sig Start Date End Date Taking? Authorizing Provider  albuterol (PROVENTIL HFA;VENTOLIN HFA) 108 (90 BASE) MCG/ACT inhaler Inhale 2 puffs into the lungs every 6 (six) hours as needed. For RESCUE.   Yes Historical Provider, MD  albuterol (PROVENTIL) (2.5 MG/3ML) 0.083% nebulizer solution Take 2.5 mg by nebulization every 6 (six) hours as needed for wheezing or shortness of breath.   Yes Historical Provider, MD  asenapine (SAPHRIS) 5  MG SUBL 24 hr tablet Place 5 mg under the tongue at bedtime.   Yes Historical Provider, MD  aspirin EC 81 MG tablet Take 81 mg by mouth daily.   Yes Historical Provider, MD  clonazePAM (KLONOPIN) 2 MG tablet Take 2 mg by mouth 4 (four) times daily as needed for anxiety. For anxiety   Yes Historical Provider, MD  cyclobenzaprine (FLEXERIL) 10 MG tablet Take 10 mg by mouth 3 (three) times daily as needed for muscle spasms.   Yes Historical Provider, MD  docusate sodium (COLACE) 100 MG capsule Take 100 mg by mouth daily as needed for constipation.   Yes Historical Provider, MD  famotidine (PEPCID AC) 10 MG chewable tablet Chew 20 mg by mouth at bedtime as needed for heartburn.   Yes Historical Provider, MD  furosemide (LASIX) 20 MG tablet Take 20 mg by mouth  daily as needed for fluid or edema.   Yes Historical Provider, MD  HYDROcodone-acetaminophen (NORCO) 10-325 MG per tablet Take 1-2 tablets by mouth every 6 (six) hours as needed for pain.    Yes Historical Provider, MD  lithium carbonate (LITHOBID) 300 MG CR tablet Take 600 mg by mouth 2 (two) times daily.   Yes Historical Provider, MD  OxyCODONE (OXYCONTIN) 40 mg T12A 12 hr tablet Take 40 mg by mouth every 12 (twelve) hours.   Yes Historical Provider, MD  pantoprazole (PROTONIX) 40 MG tablet Take 40 mg by mouth daily as needed (for reflux).   Yes Historical Provider, MD  potassium chloride (K-DUR) 10 MEQ tablet Take 10 mEq by mouth daily as needed (for use with Furosemide).   Yes Historical Provider, MD  roflumilast (DALIRESP) 500 MCG TABS tablet Take 1 tablet (500 mcg total) by mouth daily. 04/17/13  Yes Samuella Cota, MD  tiotropium (SPIRIVA) 18 MCG inhalation capsule Place 1 capsule (18 mcg total) into inhaler and inhale daily. 04/17/13  Yes Samuella Cota, MD  senna-docusate (SENOKOT-S) 8.6-50 MG per tablet Take 1 tablet by mouth daily as needed for constipation.    Historical Provider, MD   Allergies  Allergen Reactions  . Haldol [Haloperidol Decanoate] Other (See Comments)    Dystonic reaction, muscle cramping  . Ibuprofen Other (See Comments)    Stomach upset inst not to take by dr  . Recardo Evangelist [Pregabalin] Other (See Comments)    "if I take more than I/day I start to feel jittery"  . Ultram [Tramadol Hcl] Other (See Comments)    Hallucinations    History  Substance Use Topics  . Smoking status: Former Smoker -- 0.50 packs/day for 29 years    Types: Cigarettes    Quit date: 01/25/2013  . Smokeless tobacco: Never Used     Comment: 05/03/2012 Patient is currently using the Blue cigarettes to help stop smoking/using smokeless now  . Alcohol Use: No    Family History  Problem Relation Age of Onset  . Colon cancer Neg Hx   . Cancer Mother     bone     Review of  Systems  Positive ROS: neg  All other systems have been reviewed and were otherwise negative with the exception of those mentioned in the HPI and as above.  Objective: Vital signs in last 24 hours: Temp:  [97.4 F (36.3 C)] 97.4 F (36.3 C) (01/08 [id number]) Pulse Rate:  [97] 97 (01/08 0608) Resp:  [20] 20 (01/08 0608) BP: (129)/(76) 129/76 mmHg (01/08 0608) SpO2:  [95 %] 95 % (01/08 [id number])  General Appearance: Alert,  cooperative, no distress, appears stated age Head: Normocephalic, without obvious abnormality, atraumatic Eyes: PERRL, conjunctiva/corneas clear, EOM's intact    Neck: Supple, symmetrical, trachea midline Back: Symmetric, no curvature, ROM normal, no CVA tenderness Lungs:  respirations unlabored Heart: Regular rate and rhythm Abdomen: Soft, non-tender Extremities: Extremities normal, atraumatic, no cyanosis or edema Pulses: 2+ and symmetric all extremities Skin: Skin color, texture, turgor normal, no rashes or lesions  NEUROLOGIC:   Mental status: Alert and oriented x4,  no aphasia, good attention span, fund of knowledge, and memory Motor Exam - grossly normal Sensory Exam - grossly normal Reflexes: 1= Coordination - grossly normal Gait - grossly normal Balance - grossly normal Cranial Nerves: I: smell Not tested  II: visual acuity  OS: nl    OD: nl  II: visual fields Full to confrontation  II: pupils Equal, round, reactive to light  III,VII: ptosis None  III,IV,VI: extraocular muscles  Full ROM  V: mastication Normal  V: facial light touch sensation  Normal  V,VII: corneal reflex  Present  VII: facial muscle function - upper  Normal  VII: facial muscle function - lower Normal  VIII: hearing Not tested  IX: soft palate elevation  Normal  IX,X: gag reflex Present  XI: trapezius strength  5/5  XI: sternocleidomastoid strength 5/5  XI: neck flexion strength  5/5  XII: tongue strength  Normal    Data Review Lab Results  Component Value Date   WBC 10.0  08/28/2013   HGB 15.7* 08/28/2013   HCT 49.2* 08/28/2013   MCV 89.9 08/28/2013   PLT 480* 08/28/2013   Lab Results  Component Value Date   NA 139 08/28/2013   K 5.0 08/28/2013   CL 96 08/28/2013   CO2 29 08/28/2013   BUN 17 08/28/2013   CREATININE 0.76 08/28/2013   GLUCOSE 121* 08/28/2013   Lab Results  Component Value Date   INR 0.89 08/28/2013    Assessment/Plan: Patient admitted for re-do fusion L2-5. Patient has failed a reasonable attempt at conservative therapy.  I explained the condition and procedure to the patient and answered any questions.  Patient wishes to proceed with procedure as planned. Understands risks/ benefits and typical outcomes of procedure.   Haskell Rihn S 09/05/2013 7:41 AM

## 2013-09-05 NOTE — Preoperative (Signed)
Beta Blockers   Reason not to administer Beta Blockers:Not Applicable 

## 2013-09-05 NOTE — Progress Notes (Signed)
Late Entry for 1300. Patient is getting upset that she's not getting a room assignment soon enough. Explained that awaiting room assignment from [facility] that none is available as of this time. Patient needs attended to as needed. Then patient wants to talk to Dr. Ronnald Ramp, informed patient that he is in surgery. At abt. 1400, Dr. Ronnald Ramp talked to pt. Order change to 3500. Bed control and 3500 notified. Patient made aware of room assignment change.

## 2013-09-06 LAB — CBC
HCT: 38.4 % (ref 36.0–46.0)
Hemoglobin: 12.1 g/dL (ref 12.0–15.0)
MCH: 28.7 pg (ref 26.0–34.0)
MCHC: 31.5 g/dL (ref 30.0–36.0)
MCV: 91.2 fL (ref 78.0–100.0)
Platelets: 333 10*3/uL (ref 150–400)
RBC: 4.21 MIL/uL (ref 3.87–5.11)
RDW: 13.6 % (ref 11.5–15.5)
WBC: 12.9 10*3/uL — ABNORMAL HIGH (ref 4.0–10.5)

## 2013-09-06 MED ORDER — OXYCODONE HCL ER 40 MG PO T12A: 40.0000 mg | EXTENDED_RELEASE_TABLET | Freq: Two times a day (BID) | ORAL | Status: DC

## 2013-09-06 MED ORDER — CYCLOBENZAPRINE HCL 10 MG PO TABS: 10.0000 mg | ORAL_TABLET | Freq: Three times a day (TID) | ORAL | Status: AC | PRN

## 2013-09-06 NOTE — Progress Notes (Signed)
Occupational Therapy Evaluation Patient Details Name: Brenda Frank MRN: [id number] DOB: 15-Nov-1970 Today's Date: 09/06/2013 Time: 7673-4193 OT Time Calculation (min): 14 min  OT Assessment / Plan / Recommendation History of present illness   Posterior fixation L2-L5 inclusive on the right using cortical pedicle screws.  2. Intertransverse arthrodesis L2-L5 using morcellized autograft obtained through a separate fascial incision from the right iliac crest and allograft    Clinical Impression   PTA, pt independent with ADL and mobility. Completed education on DME and AE. Pt/spouse verbalized understanding. Reviewed back precautions as pt leaning forward to reach socks while therapist in room.  Pt verbalized understanding of importance of following precautions. OT signing off.    OT Assessment  Patient does not need any further OT services    Follow Up Recommendations  No OT follow up    Barriers to Discharge      Equipment Recommendations  None recommended by OT    Recommendations for Other Services    Frequency       Precautions / Restrictions Precautions Precautions: Back Precaution Booklet Issued: Yes (comment) Required Braces or Orthoses: Spinal Brace Spinal Brace: Lumbar corset;Applied in sitting position Restrictions Weight Bearing Restrictions: No   Pertinent Vitals/Pain no apparent distress     ADL  Upper Body Bathing: Set up Where Assessed - Upper Body Bathing: Unsupported sitting Lower Body Bathing: Minimal assistance Where Assessed - Lower Body Bathing: Unsupported sit to stand Upper Body Dressing: Supervision/safety;Set up Where Assessed - Upper Body Dressing: Unsupported sit to stand Lower Body Dressing: Minimal assistance Where Assessed - Lower Body Dressing: Unsupported sit to stand Toilet Transfer: Supervision/safety Toilet Transfer Method: Sit to stand Toileting - Clothing Manipulation and Hygiene: Moderate assistance Transfers/Ambulation Related to  ADLs: S ADL Comments: Educated pt on AE for LB ADL. Pt staes she has difficulaty with hygiene after toileting. educated pt on AE and compensatory techniques for toileting.     OT Diagnosis:    OT Problem List:   OT Treatment Interventions:     OT Goals(Current goals can be found in the care plan section) Acute Rehab OT Goals Patient Stated Goal: eval only  Visit Information  Last OT Received On: 09/06/13 Assistance Needed: +1       Prior [place] expects to be discharged to:: Private residence Living Arrangements: Spouse/significant other;Children Available Help at Discharge: Friend(s);Available 24 hours/day;Family;Available PRN/intermittently Type of Home: House Home Access: Stairs to enter [employer] of Steps: 3 Entrance Stairs-Rails: Left Home Layout: One level Home Equipment: Walker - 2 wheels;Bedside commode;Tub bench Additional Comments: Spouse is ICU RN at [employer] [employer] [facility] [employer].  She works as a Restaurant manager, fast food Level of Independence: Independent         Vision/Perception     Solicitor Arousal/Alertness: Awake/alert Behavior During Therapy: WFL for tasks assessed/performed Overall Cognitive Status: Within Functional Limits for tasks assessed    Extremity/Trunk Assessment Upper Extremity Assessment Upper Extremity Assessment: Overall WFL for tasks assessed Lower Extremity Assessment Lower Extremity Assessment: Defer to PT evaluation Cervical / Trunk Assessment Cervical / Trunk Assessment: Normal     Mobility Bed Mobility Overal bed mobility: Modified Independent Transfers Overall transfer level: Modified independent     Exercise     Balance  S   End of Session OT - End of Session Equipment Utilized During Treatment: Back brace Activity Tolerance: Patient tolerated treatment well Patient left: in chair;with call bell/phone within reach;with family/visitor present Nurse Communication:  Mobility status  GO     Clarissia Mckeen,HILLARY 09/06/2013, 10:32 AM [facility], OTR/L  [phone] 09/06/2013

## 2013-09-06 NOTE — Discharge Summary (Signed)
Physician Discharge Summary  Patient ID: Brenda Frank MRN: [id number] DOB/AGE: 1971-05-30 43 y.o.  Admit date: 09/05/2013 Discharge date: 09/06/2013  Admission Diagnoses: Lumbar pseudoarthrosis   Discharge Diagnoses: same   Discharged Condition: good  Hospital Course: The patient was admitted on 09/05/2013 and taken to the operating room where the patient underwent lumbar fusion. The patient tolerated the procedure well and was taken to the recovery room and then to the floor in stable condition. The hospital course was routine. There were no complications. The wound remained clean dry and intact. Pt had appropriate back soreness. No complaints of leg pain or new N/T/W. The patient remained afebrile with stable vital signs, and tolerated a regular diet. The patient continued to increase activities, and pain was well controlled with oral pain medications.   Consults: None  Significant Diagnostic Studies:  Results for orders placed during the hospital encounter of 09/05/13  GLUCOSE, CAPILLARY      Result Value Range   Glucose-Capillary 112 (*) 70 - 99 mg/dL  GLUCOSE, CAPILLARY      Result Value Range   Glucose-Capillary 126 (*) 70 - 99 mg/dL   Comment 1 Documented in Chart     Comment 2 Notify RN    CBC      Result Value Range   WBC 12.9 (*) 4.0 - 10.5 K/uL   RBC 4.21  3.87 - 5.11 MIL/uL   Hemoglobin 12.1  12.0 - 15.0 g/dL   HCT 38.4  36.0 - 46.0 %   MCV 91.2  78.0 - 100.0 fL   MCH 28.7  26.0 - 34.0 pg   MCHC 31.5  30.0 - 36.0 g/dL   RDW 13.6  11.5 - 15.5 %   Platelets 333  150 - 400 K/uL    Chest 2 View  08/28/2013   CLINICAL DATA:  Preop for lumbar spine surgery  EXAM: CHEST  2 VIEW  COMPARISON:  05/17/2013  FINDINGS: Heart size and mediastinal contours normal. Vascular pattern normal. Lungs clear except for minimal scarring or atelectasis in the lateral lingula, not significantly different from the prior study. No pleural effusions.  IMPRESSION: No active cardiopulmonary  disease.   Electronically Signed   By: Skipper Cliche M.D.   On: 08/28/2013 11:57   Dg Lumbar Spine 2-3 Views  09/05/2013   CLINICAL DATA:  L2 through 5 PLIF  EXAM: LUMBAR SPINE - 2-3 VIEW; DG C-ARM 1-60 MIN  TECHNIQUE: Single intraoperative fluoroscopic spot image  COMPARISON:  CT L SPINE W/CM dated 07/04/2013  FLUOROSCOPY TIME:  1 min 16 seconds  FINDINGS: Single frontal fluoroscopic spot image of the lumbar spine demonstrate L2 through L5 PLIF and decompression. No failure or complication. There is patient motion degrading image quality limiting evaluation.  IMPRESSION: Single frontal fluoroscopic spot image of the lumbar spine demonstrates L2 through L5 PLIF.   Electronically Signed   By: Kathreen Devoid   On: 09/05/2013 14:09   Dg C-arm 1-60 Min  09/05/2013   CLINICAL DATA:  L2 through 5 PLIF  EXAM: LUMBAR SPINE - 2-3 VIEW; DG C-ARM 1-60 MIN  TECHNIQUE: Single intraoperative fluoroscopic spot image  COMPARISON:  CT L SPINE W/CM dated 07/04/2013  FLUOROSCOPY TIME:  1 min 16 seconds  FINDINGS: Single frontal fluoroscopic spot image of the lumbar spine demonstrate L2 through L5 PLIF and decompression. No failure or complication. There is patient motion degrading image quality limiting evaluation.  IMPRESSION: Single frontal fluoroscopic spot image of the lumbar spine demonstrates L2 through L5  PLIF.   Electronically Signed   By: Kathreen Devoid   On: 09/05/2013 14:09    Antibiotics:  Anti-infectives   Start     Dose/Rate Route Frequency Ordered Stop   09/05/13 1615  ceFAZolin (ANCEF) IVPB 1 g/50 mL premix     1 g 100 mL/hr over 30 Minutes Intravenous Every 8 hours 09/05/13 1609 09/06/13 0021   09/05/13 0746  bacitracin 50,000 Units in sodium chloride irrigation 0.9 % 500 mL irrigation  Status:  Discontinued       As needed 09/05/13 0856 09/05/13 1136   09/05/13 0600  ceFAZolin (ANCEF) IVPB 2 g/50 mL premix     2 g 100 mL/hr over 30 Minutes Intravenous On call to O.R. 09/04/13 1300 09/05/13 0800       Discharge Exam: Blood pressure 98/65, pulse 104, temperature 99.6 F (37.6 C), temperature source Oral, resp. rate 16, SpO2 92.00%. Neurologic: Grossly normal Incision DCI  Discharge Medications:     Medication List         albuterol 108 (90 BASE) MCG/ACT inhaler  Commonly known as:  PROVENTIL HFA;VENTOLIN HFA  Inhale 2 puffs into the lungs every 6 (six) hours as needed. For RESCUE.     albuterol (2.5 MG/3ML) 0.083% nebulizer solution  Commonly known as:  PROVENTIL  Take 2.5 mg by nebulization every 6 (six) hours as needed for wheezing or shortness of breath.     asenapine 5 MG Subl 24 hr tablet  Commonly known as:  SAPHRIS  Place 5 mg under the tongue at bedtime.     aspirin EC 81 MG tablet  Take 81 mg by mouth daily.     clonazePAM 2 MG tablet  Commonly known as:  KLONOPIN  Take 2 mg by mouth 4 (four) times daily as needed for anxiety. For anxiety     cyclobenzaprine 10 MG tablet  Commonly known as:  FLEXERIL  Take 1 tablet (10 mg total) by mouth 3 (three) times daily as needed for muscle spasms.     docusate sodium 100 MG capsule  Commonly known as:  COLACE  Take 100 mg by mouth daily as needed for constipation.     famotidine 10 MG chewable tablet  Commonly known as:  PEPCID AC  Chew 20 mg by mouth at bedtime as needed for heartburn.     furosemide 20 MG tablet  Commonly known as:  LASIX  Take 20 mg by mouth daily as needed for fluid or edema.     HYDROcodone-acetaminophen 10-325 MG per tablet  Commonly known as:  NORCO  Take 1-2 tablets by mouth every 6 (six) hours as needed for pain.     lithium carbonate 300 MG CR tablet  Commonly known as:  LITHOBID  Take 600 mg by mouth 2 (two) times daily.     OxyCODONE 40 mg T12a 12 hr tablet  Commonly known as:  OXYCONTIN  Take 1 tablet (40 mg total) by mouth every 12 (twelve) hours.     pantoprazole 40 MG tablet  Commonly known as:  PROTONIX  Take 40 mg by mouth daily as needed (for reflux).      potassium chloride 10 MEQ tablet  Commonly known as:  K-DUR  Take 10 mEq by mouth daily as needed (for use with Furosemide).     roflumilast 500 MCG Tabs tablet  Commonly known as:  DALIRESP  Take 1 tablet (500 mcg total) by mouth daily.     senna-docusate 8.6-50 MG per tablet  Commonly known as:  Senokot-S  Take 1 tablet by mouth daily as needed for constipation.     tiotropium 18 MCG inhalation capsule  Commonly known as:  SPIRIVA  Place 1 capsule (18 mcg total) into inhaler and inhale daily.        Disposition: home   Final Dx: lumbar fusion L2-L5      Discharge Orders   Future Appointments Provider Department Dept Phone   12/10/2013 10:30 AM Rogene Houston, MD [facility] [phone]   Future Orders Complete By Expires   Call MD for:  difficulty breathing, headache or visual disturbances  As directed    Call MD for:  persistant nausea and vomiting  As directed    Call MD for:  redness, tenderness, or signs of infection (pain, swelling, redness, odor or green/yellow discharge around incision site)  As directed    Call MD for:  severe uncontrolled pain  As directed    Call MD for:  temperature >100.4  As directed    Diet - low sodium heart healthy  As directed    Discharge instructions  As directed    Comments:     No driving, no strenuous activity, no heavy lifting   Increase activity slowly  As directed       Follow-up Information   Follow up with Eustace Moore, MD In 2 weeks.   Specialty:  Neurosurgery   Contact information:   [address] [place] [address] [phone]        Signed: Eustace Moore 09/06/2013, 10:20 AM

## 2013-09-06 NOTE — Progress Notes (Signed)
Pt and spouse given D/C instructions with Rx's, verbal understanding was given. Pt D/C'd home via wheelchair @ 1415 per MD order. Pt stable @ D/C and has no other needs at this time. Holli Humbles, RN

## 2013-09-06 NOTE — Evaluation (Signed)
Read, reviewed, edited and agree with student's findings and recommendations.  Perrion Diesel B. Clayson Riling, PT, DPT #[phone]  

## 2013-09-06 NOTE — Evaluation (Signed)
Physical Therapy Evaluation Patient Details Name: Brenda Frank MRN: [id number] DOB: Oct 06, 1970 Today's Date: 09/06/2013 Time: 0930-1001 PT Time Calculation (min): 31 min  PT Assessment / Plan / Recommendation History of Present Illness  s/p posterior fixation L2-L5 inclusive on the right using cortical pedicle screws. Intertransverse arthrodesis L2-L5 using morcellized autograft obtained through a separate fascial incision from the right iliac crest and allograft on 09/05/13.  Clinical Impression  Patient is s/p above surgery resulting in the deficits listed below (see PT Problem List). Patient has good caregiver support at home 24/7 that will allow safe d/c.  Patient was unable to perform stairs today due to pain and fatigue, it would be good to assess these next session. Eduction given on precautions and activity recommendations.  Patient will benefit from skilled PT to increase their independence and safety with mobility (while adhering to their precautions) to allow discharge home.     PT Assessment  Patient needs continued PT services    Follow Up Recommendations  No PT follow up;Supervision/Assistance - 24 hour          Equipment Recommendations  None recommended by PT (She owns a RW and cane already)       Frequency Min 5X/week    Precautions / Restrictions Precautions Precautions: Back Precaution Booklet Issued: Yes (comment) Required Braces or Orthoses: Spinal Brace Spinal Brace: Lumbar corset;Applied in sitting position Restrictions Weight Bearing Restrictions: No   Pertinent Vitals/Pain Pain is 7/10 over incision, changed position to sitting after ambulation to address.      Mobility  Bed Mobility Overal bed mobility: Modified Independent General bed mobility comments: Needs increased time and VC for proper log roll form.  Education and demonstration provided on proper form Transfers Overall transfer level: Modified independent Equipment used: None General  transfer comment: Increased time required Ambulation/Gait Ambulation/Gait assistance: Min guard Ambulation Distance (Feet): 150 Feet Assistive device: Rolling walker (2 wheeled) Gait Pattern/deviations: Step-through pattern General Gait Details: Tentative gait pattern throughout ambulation.  She prefers to hold onto a hand for support.  We ambulated without AD to assess her abilities, and together we determined that she is safest with a RW due to its added support.  She quickly fatigues at the end of ambulation.        PT Diagnosis: Generalized weakness;Difficulty walking  PT Problem List: Decreased strength;Decreased activity tolerance;Decreased balance;Decreased mobility;Decreased coordination;Decreased safety awareness PT Treatment Interventions: Gait training;Stair training;DME instruction;Therapeutic activities;Functional mobility training;Therapeutic exercise;Neuromuscular re-education;Patient/family education     PT Goals(Current goals can be found in the care plan section) Acute Rehab PT Goals Patient Stated Goal: to return home PT Goal Formulation: With patient Time For Goal Achievement: 09/20/13 Potential to Achieve Goals: Good  Visit Information  Last PT Received On: 09/06/13 Assistance Needed: +1 History of Present Illness: s/p posterior fixation L2-L5 inclusive on the right using cortical pedicle screws. Intertransverse arthrodesis L2-L5 using morcellized autograft obtained through a separate fascial incision from the right iliac crest and allograft on 09/05/13.       Prior [place] expects to be discharged to:: Private residence Living Arrangements: Spouse/significant other;Children Available Help at Discharge: Friend(s);Available 24 hours/day;Family;Available PRN/intermittently Type of Home: House Home Access: Stairs to enter [employer] of Steps: 3 Entrance Stairs-Rails: Left Home Layout: One level Home Equipment: Walker -  2 wheels;Bedside commode;Tub bench Additional Comments: Spouse is ICU RN at [employer]  She works as a Restaurant manager, fast food Level of Independence: Independent Communication Communication: No difficulties  Cognition  Cognition Arousal/Alertness: Awake/alert Behavior During Therapy: WFL for tasks assessed/performed Overall Cognitive Status: Within Functional Limits for tasks assessed    Extremity/Trunk Assessment Upper Extremity Assessment Upper Extremity Assessment: Defer to OT evaluation Lower Extremity Assessment Lower Extremity Assessment: Generalized weakness. Her L leg has some residual deficits from previous surgery that results in minor weakness and fatiguing with activity Cervical / Trunk Assessment Cervical / Trunk Assessment: Normal      End of Session PT - End of Session Equipment Utilized During Treatment: Gait belt Activity Tolerance: Patient limited by fatigue Patient left: in chair;with call bell/phone within reach;with family/visitor present Nurse Communication: Mobility status  GP    Cordelia Poche, SPT Pager:  [phone]  Cordelia Poche 09/06/2013, 10:56 AM

## 2013-12-09 ENCOUNTER — Ambulatory Visit (HOSPITAL_COMMUNITY)
Admission: RE | Admit: 2013-12-09 | Discharge: 2013-12-09 | Disposition: A | Discharge status: Home / Self Care | Payer: 59 | Source: Ambulatory Visit | Attending: Pulmonary Disease | Admitting: Pulmonary Disease

## 2013-12-09 ENCOUNTER — Other Ambulatory Visit (HOSPITAL_COMMUNITY): Payer: Self-pay | Admitting: Pulmonary Disease

## 2013-12-09 DIAGNOSIS — J189 Pneumonia, unspecified organism: Secondary | ICD-10-CM

## 2013-12-10 ENCOUNTER — Ambulatory Visit (INDEPENDENT_AMBULATORY_CARE_PROVIDER_SITE_OTHER): Payer: 59 | Admitting: Internal Medicine

## 2013-12-11 ENCOUNTER — Other Ambulatory Visit (HOSPITAL_COMMUNITY): Payer: Self-pay | Admitting: Pulmonary Disease

## 2013-12-11 DIAGNOSIS — R1900 Intra-abdominal and pelvic swelling, mass and lump, unspecified site: Secondary | ICD-10-CM

## 2013-12-13 ENCOUNTER — Ambulatory Visit (HOSPITAL_COMMUNITY)
Admission: RE | Admit: 2013-12-13 | Discharge: 2013-12-13 | Disposition: A | Discharge status: Home / Self Care | Payer: 59 | Source: Ambulatory Visit | Attending: Pulmonary Disease | Admitting: Pulmonary Disease

## 2013-12-13 DIAGNOSIS — R1900 Intra-abdominal and pelvic swelling, mass and lump, unspecified site: Secondary | ICD-10-CM

## 2013-12-13 DIAGNOSIS — R1909 Other intra-abdominal and pelvic swelling, mass and lump: Secondary | ICD-10-CM | POA: Insufficient documentation

## 2013-12-13 DIAGNOSIS — K7689 Other specified diseases of liver: Secondary | ICD-10-CM | POA: Insufficient documentation

## 2014-02-06 ENCOUNTER — Encounter (INDEPENDENT_AMBULATORY_CARE_PROVIDER_SITE_OTHER): Payer: Self-pay | Admitting: Internal Medicine

## 2014-02-06 ENCOUNTER — Ambulatory Visit (INDEPENDENT_AMBULATORY_CARE_PROVIDER_SITE_OTHER): Payer: 59 | Admitting: Internal Medicine

## 2014-02-06 VITALS — BP 110/82 | HR 84 | Temp 97.6°F | Ht 65.5 in | Wt 291.6 lb

## 2014-02-06 DIAGNOSIS — G8929 Other chronic pain: Secondary | ICD-10-CM | POA: Insufficient documentation

## 2014-02-06 DIAGNOSIS — R1011 Right upper quadrant pain: Secondary | ICD-10-CM

## 2014-02-06 MED ORDER — ONDANSETRON HCL 4 MG PO TABS: 4.0000 mg | ORAL_TABLET | Freq: Three times a day (TID) | ORAL | Status: AC | PRN

## 2014-02-06 NOTE — Patient Instructions (Addendum)
Ct abdomen/pelvis with CM. CMET, CBC. Further recommendations to follow. If pain worsen, go to the emergency dept.

## 2014-02-06 NOTE — Progress Notes (Signed)
Subjective:     Patient ID: Brenda Frank, female   DOB: Oct 17, 1970, 43 y.o.   MRN: [id number]  HPI Presents today with c/o nausea and vomiting.She c/o severe rt abdominal pain with nausea and vomiting x 5 days.  Rates the pain 5/10.  She has nausea even when she doesn't eat. She has taken pain medication for this. She has been resting. She has been watching what she eats. She has had a low grade fever of about 99 two days ago. She has eaten soup for the past couple of days. She has not had much of an appetite. She has not been taking any NSAIDs except for ASA 81mg  daily. She saw   and Dr. Nevada Crane for same last week but I do not have these results nor does patient.. She saw Dr. Luan Pulling in April for same and [place] ordered.  12/11/2013 US abdomen: None.  IMPRESSION:  Hepatic steatosis otherwise unremarkable abdominal ultrasound.    10/24/2011 Colonoscopy: Rt sided abdominal pain Prep satisfactory.  4 mm ulcer noted at ileocecal valve. There was edema and erythema to the surrounding mucosa. Biopsy taken from this area.  Few diverticula at the cecum and ascending colon and a few at the descending colon.  Normal rectal mucosa and ano-rectal junction  Notes Recorded by Rogene Houston, MD on 10/28/2011 at 12:15 PM Stool culture and O&P are negative.  Biopsy results noted. I agree that biopsy is from surrounding tissue and not from ulcer margin. All results reviewed with Ms. Jadene Pierini.  My notes were read back to patient and mate and they were agreeable.   Review of Systems Past Medical History  Diagnosis Date  . Diverticulosis   . 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 Polycythemia 10/25/2011    Query secondary to smoking or hypoxia. Hemoglobin 16-17 g  . Cecal ulcer 10/26/2011    Per colonoscopy  . Diverticulosis 10/26/2011    Per colonoscopy  . GERD (gastroesophageal reflux disease)   . Hernia, inguinal     rt  . Disorder of vocal cords     done  when intubated for hysterectomy  2008  .  Chronic bronchitis 05/03/2012    "gets it 1-2X q yr"  . COPD with emphysema   . Pneumonia 2007  . Exertional dyspnea     "because of the COPD"  . History of duodenal ulcer   . Chronic lower back pain   . Bipolar affective     "under control last 12 years" (05/03/2012)  . Skin cancer of face     "had it taken off"  . Asthma   . Emphysema of lung   . Cough   . Wheezing   . Hypertension     12/14 not taking any meds at this time  . CHF (congestive heart failure)     pt states she has never had this.     Past Surgical History  Procedure Laterality Date  . Colonoscopy  10/25/2011    Procedure: COLONOSCOPY;  Surgeon: Rogene Houston, MD;  Location: [facility];  Service: Endoscopy;  Laterality: N/A;  . Lateral / posterior combined fusion lumbar spine  05/03/2012  . Bilateral oophorectomy  2012  . Appendectomy  2000's  . Skin cancer excision      facial  . Anterior lat lumbar fusion  05/03/2012    Procedure: ANTERIOR LATERAL LUMBAR FUSION 3 LEVELS;  Surgeon: Eustace Moore, MD;  Location: [facility];  Service: Neurosurgery;  Laterality: Left;  Left lumbar two-three, lumbar three-four, lumbar four-five anteriolateral retroperitoneal interbody fusion with pedicle screws  . Abdominal hysterectomy  10/2006  . Hernia repair  03/2008    "2 abdominal hernias repaired"  . Cholecystectomy  05/2007  . Back surgery    . Incisional hernia repair Left 04/09/2013    Procedure: HERNIA REPAIR INCISIONAL;  Surgeon: Rolm Bookbinder, MD;  Location: [place];  Service: General;  Laterality: Left;  . Insertion of mesh Left 04/09/2013    Procedure: INSERTION OF MESH;  Surgeon: Rolm Bookbinder, MD;  Location: [place];  Service: General;  Laterality: Left;    Allergies  Allergen Reactions  . Haldol [Haloperidol Decanoate] Other (See Comments)    Dystonic reaction, muscle cramping  . Ibuprofen Other (See Comments)    Stomach upset inst not to take by dr  . Recardo Evangelist [Pregabalin] Other (See Comments)    "if I  take more than I/day I start to feel jittery"  . Ultram [Tramadol Hcl] Other (See Comments)    Hallucinations    Current Outpatient Prescriptions on File Prior to Visit  Medication Sig Dispense Refill  . albuterol (PROVENTIL HFA;VENTOLIN HFA) 108 (90 BASE) MCG/ACT inhaler Inhale 2 puffs into the lungs every 6 (six) hours as needed. For RESCUE.      Marland Kitchen albuterol (PROVENTIL) (2.5 MG/3ML) 0.083% nebulizer solution Take 2.5 mg by nebulization every 6 (six) hours as needed for wheezing or shortness of breath.      Marland Kitchen aspirin EC 81 MG tablet Take 81 mg by mouth daily.      . clonazePAM (KLONOPIN) 2 MG tablet Take 2 mg by mouth 4 (four) times daily as needed for anxiety. For anxiety      . cyclobenzaprine (FLEXERIL) 10 MG tablet Take 1 tablet (10 mg total) by mouth 3 (three) times daily as needed for muscle spasms.  90 tablet  1  . docusate sodium (COLACE) 100 MG capsule Take 100 mg by mouth daily as needed for constipation.      Marland Kitchen HYDROcodone-acetaminophen (NORCO) 10-325 MG per tablet Take 1-2 tablets by mouth every 6 (six) hours as needed for pain.       Marland Kitchen lithium carbonate (LITHOBID) 300 MG CR tablet Take 600 mg by mouth 2 (two) times daily.      Marland Kitchen senna-docusate (SENOKOT-S) 8.6-50 MG per tablet Take 1 tablet by mouth daily as needed for constipation.      Marland Kitchen tiotropium (SPIRIVA) 18 MCG inhalation capsule Place 1 capsule (18 mcg total) into inhaler and inhale daily.  30 capsule  0   No current facility-administered medications on file prior to visit.        Objective:   Physical Exam  Filed Vitals:   02/06/14 1541  BP: 110/82  Pulse: 84  Temp: 97.6 F (36.4 C)  Height: 5' 5.5" (1.664 m)  Weight: 291 lb 9.6 oz (132.269 kg)   Alert and oriented. Skin warm and dry. Oral mucosa is moist.   . Sclera anicteric, conjunctivae is pink. Thyroid not enlarged. No cervical lymphadenopathy. Lungs clear. Heart regular rate and rhythm.  Abdomen is soft. Bowel sounds are positive. No hepatomegaly. Morbidly  obese. No abdominal masses felt. Minimal tenderness rt upper quadrant.  No edema to lower extremities.      Assessment:     Rt upper quadrant pain x days.? Etiology.  She has had a cholecystecomy in the past. Recent US revealed CBD 5.2     Plan:  CT abdomen/pelvis with CM. CMET and CBC. Further recommendations once I have these back. If pain worsens, I advised patient to go to the ED. Rx for Zofran Eprescribed to her pharmacy

## 2014-02-09 ENCOUNTER — Emergency Department (HOSPITAL_COMMUNITY)
Admission: EM | Admit: 2014-02-09 | Discharge: 2014-02-10 | Disposition: A | Discharge status: Home / Self Care | Payer: 59 | Attending: Emergency Medicine | Admitting: Emergency Medicine

## 2014-02-09 ENCOUNTER — Emergency Department (HOSPITAL_COMMUNITY): Payer: 59

## 2014-02-09 ENCOUNTER — Encounter (HOSPITAL_COMMUNITY): Payer: Self-pay | Admitting: Emergency Medicine

## 2014-02-09 DIAGNOSIS — I509 Heart failure, unspecified: Secondary | ICD-10-CM | POA: Insufficient documentation

## 2014-02-09 DIAGNOSIS — M7989 Other specified soft tissue disorders: Secondary | ICD-10-CM | POA: Insufficient documentation

## 2014-02-09 DIAGNOSIS — Z8719 Personal history of other diseases of the digestive system: Secondary | ICD-10-CM | POA: Insufficient documentation

## 2014-02-09 DIAGNOSIS — J438 Other emphysema: Secondary | ICD-10-CM | POA: Insufficient documentation

## 2014-02-09 DIAGNOSIS — Z862 Personal history of diseases of the blood and blood-forming organs and certain disorders involving the immune mechanism: Secondary | ICD-10-CM | POA: Insufficient documentation

## 2014-02-09 DIAGNOSIS — F411 Generalized anxiety disorder: Secondary | ICD-10-CM | POA: Insufficient documentation

## 2014-02-09 DIAGNOSIS — R109 Unspecified abdominal pain: Secondary | ICD-10-CM | POA: Insufficient documentation

## 2014-02-09 DIAGNOSIS — Z85828 Personal history of other malignant neoplasm of skin: Secondary | ICD-10-CM | POA: Insufficient documentation

## 2014-02-09 DIAGNOSIS — I10 Essential (primary) hypertension: Secondary | ICD-10-CM | POA: Insufficient documentation

## 2014-02-09 DIAGNOSIS — Z79899 Other long term (current) drug therapy: Secondary | ICD-10-CM | POA: Insufficient documentation

## 2014-02-09 DIAGNOSIS — Z87891 Personal history of nicotine dependence: Secondary | ICD-10-CM | POA: Insufficient documentation

## 2014-02-09 DIAGNOSIS — R0789 Other chest pain: Secondary | ICD-10-CM | POA: Insufficient documentation

## 2014-02-09 DIAGNOSIS — E669 Obesity, unspecified: Secondary | ICD-10-CM | POA: Insufficient documentation

## 2014-02-09 DIAGNOSIS — Z8701 Personal history of pneumonia (recurrent): Secondary | ICD-10-CM | POA: Insufficient documentation

## 2014-02-09 DIAGNOSIS — G8929 Other chronic pain: Secondary | ICD-10-CM | POA: Insufficient documentation

## 2014-02-09 DIAGNOSIS — Z7982 Long term (current) use of aspirin: Secondary | ICD-10-CM | POA: Insufficient documentation

## 2014-02-09 LAB — CBC WITH DIFFERENTIAL/PLATELET
BASOS PCT: 0 % (ref 0–1)
Basophils Absolute: 0 10*3/uL (ref 0.0–0.1)
Eosinophils Absolute: 0.1 10*3/uL (ref 0.0–0.7)
Eosinophils Relative: 1 % (ref 0–5)
HEMATOCRIT: 44.7 % (ref 36.0–46.0)
HEMOGLOBIN: 14.4 g/dL (ref 12.0–15.0)
Lymphocytes Relative: 24 % (ref 12–46)
Lymphs Abs: 3 10*3/uL (ref 0.7–4.0)
MCH: 28.3 pg (ref 26.0–34.0)
MCHC: 32.2 g/dL (ref 30.0–36.0)
MCV: 88 fL (ref 78.0–100.0)
MONO ABS: 0.7 10*3/uL (ref 0.1–1.0)
MONOS PCT: 5 % (ref 3–12)
Neutro Abs: 8.8 10*3/uL — ABNORMAL HIGH (ref 1.7–7.7)
Neutrophils Relative %: 70 % (ref 43–77)
Platelets: 390 10*3/uL (ref 150–400)
RBC: 5.08 MIL/uL (ref 3.87–5.11)
RDW: 14.3 % (ref 11.5–15.5)
WBC: 12.5 10*3/uL — ABNORMAL HIGH (ref 4.0–10.5)

## 2014-02-09 LAB — URINALYSIS, ROUTINE W REFLEX MICROSCOPIC
BILIRUBIN URINE: NEGATIVE
Glucose, UA: NEGATIVE mg/dL
Hgb urine dipstick: NEGATIVE
Ketones, ur: NEGATIVE mg/dL
Leukocytes, UA: NEGATIVE
NITRITE: NEGATIVE
Protein, ur: NEGATIVE mg/dL
Specific Gravity, Urine: <1.005 — ABNORMAL LOW (ref 1.005–1.030)
UROBILINOGEN UA: 0.2 mg/dL (ref 0.0–1.0)
pH: 7 (ref 5.0–8.0)

## 2014-02-09 LAB — COMPREHENSIVE METABOLIC PANEL
ALK PHOS: 103 U/L (ref 39–117)
ALT: 63 U/L — ABNORMAL HIGH (ref 0–35)
AST: 47 U/L — ABNORMAL HIGH (ref 0–37)
Albumin: 3.9 g/dL (ref 3.5–5.2)
BILIRUBIN TOTAL: 0.3 mg/dL (ref 0.3–1.2)
BUN: 14 mg/dL (ref 6–23)
CO2: 28 mEq/L (ref 19–32)
Calcium: 9.6 mg/dL (ref 8.4–10.5)
Chloride: 95 mEq/L — ABNORMAL LOW (ref 96–112)
Creatinine, Ser: 0.64 mg/dL (ref 0.50–1.10)
GLUCOSE: 124 mg/dL — AB (ref 70–99)
POTASSIUM: 4.5 meq/L (ref 3.7–5.3)
Sodium: 138 mEq/L (ref 137–147)
Total Protein: 7.6 g/dL (ref 6.0–8.3)

## 2014-02-09 LAB — PRO B NATRIURETIC PEPTIDE

## 2014-02-09 LAB — TROPONIN I: Troponin I: <0.3 ng/mL (ref <0.30)

## 2014-02-09 MED ORDER — ASPIRIN 81 MG PO CHEW
324.0000 mg | CHEWABLE_TABLET | Freq: Once | ORAL | Status: AC
  Administered 2014-02-09: 324 mg via ORAL
  Filled 2014-02-09: qty 4

## 2014-02-09 MED ORDER — IOHEXOL 350 MG/ML SOLN
100.0000 mL | Freq: Once | INTRAVENOUS | Status: AC | PRN
  Administered 2014-02-09: 100 mL via INTRAVENOUS

## 2014-02-09 NOTE — ED Notes (Signed)
Also having pain in RUQ, supposed to have a CT scan on Wednesday per family.

## 2014-02-09 NOTE — ED Notes (Signed)
Chest pain for 3-4 days and pain in left side of neck and left side of face per pt.

## 2014-02-10 NOTE — ED Provider Notes (Signed)
CSN: [id number]     Arrival date & time 02/09/14  2007 History   First MD Initiated Contact with Patient 02/09/14 2020     Chief Complaint  Patient presents with  . Chest Pain     (Consider location/radiation/quality/duration/timing/severity/associated sxs/prior Treatment) HPI   Brenda Frank is a 43 y.o. female with a past medical history of polycythemia, Gerd, copd and htn presenting with a 4 day history of chest pain which is sharp and intermittent with radiation into her left neck and face.  She also reports increased shortness of breath and endorses orthopnea without wheezing, cough, fever or peripheral edema, also denies nausea, vomiting, diaphoresis and dizziness.  She does not having pain and swelling in her right knee and lower right thigh which seems worse when she first wakes and improves with movement.  She does have chronic ruq pain for which her GI specialist has scheduled a Ct scan for later this week.  She has oxygen at home but has not used since her pneumonia last fall, starting using it yesterday without significant improvement in her symptoms. She is no longer a smoker, used to smoke heavy, but not in over 1 years.  Her sugical hx is significant for cholecystectomy, total hysterectomy and hernia repairs.     Past Medical History  Diagnosis Date  . Diverticulosis   . 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 Polycythemia 10/25/2011    Query secondary to smoking or hypoxia. Hemoglobin 16-17 g  . Cecal ulcer 10/26/2011    Per colonoscopy  . Diverticulosis 10/26/2011    Per colonoscopy  . GERD (gastroesophageal reflux disease)   . Hernia, inguinal     rt  . Disorder of vocal cords     done  when intubated for hysterectomy  2008  . Chronic bronchitis 05/03/2012    "gets it 1-2X q yr"  . COPD with emphysema   . Pneumonia 2007  . Exertional dyspnea     "because of the COPD"  . History of duodenal ulcer   . Chronic lower back pain   . Bipolar affective     "under  control last 12 years" (05/03/2012)  . Skin cancer of face     "had it taken off"  . Asthma   . Emphysema of lung   . Cough   . Wheezing   . Hypertension     12/14 not taking any meds at this time  . CHF (congestive heart failure)     pt states she has never had this.    Past Surgical History  Procedure Laterality Date  . Colonoscopy  10/25/2011    Procedure: COLONOSCOPY;  Surgeon: Rogene Houston, MD;  [facility];  Service: Endoscopy;  Laterality: N/A;  . Lateral / posterior combined fusion lumbar spine  05/03/2012  . Bilateral oophorectomy  2012  . Appendectomy  2000's  . Skin cancer excision      facial  . Anterior lat lumbar fusion  05/03/2012    Procedure: ANTERIOR LATERAL LUMBAR FUSION 3 LEVELS;  Surgeon: Eustace Moore, MD;  Location: [facility];  Service: Neurosurgery;  Laterality: Left;  Left lumbar two-three, lumbar three-four, lumbar four-five anteriolateral retroperitoneal interbody fusion with pedicle screws  . Abdominal hysterectomy  10/2006  . Hernia repair  03/2008    "2 abdominal hernias repaired"  . Cholecystectomy  05/2007  . Back surgery    . Incisional hernia repair Left 04/09/2013    Procedure:  HERNIA REPAIR INCISIONAL;  Surgeon: Rolm Bookbinder, MD;  Location: [facility];  Service: General;  Laterality: Left;  . Insertion of mesh Left 04/09/2013    Procedure: INSERTION OF MESH;  Surgeon: Rolm Bookbinder, MD;  Location[facility];  Service: General;  Laterality: Left;   Family History  Problem Relation Age of Onset  . Colon cancer Neg Hx   . Cancer Mother     bone   History  Substance Use Topics  . Smoking status: Former Smoker -- 0.50 packs/day for 29 years    Types: Cigarettes    Quit date: 01/25/2013  . Smokeless tobacco: Never Used     Comment: 05/03/2012 Patient is currently using the Blue cigarettes to help stop smoking/using smokeless now  . Alcohol Use: No   OB History   Grav Para Term Preterm Abortions TAB SAB Ect Mult Living                  Review of Systems  Constitutional: Negative for fever and chills.  HENT: Negative for congestion and sore throat.   Eyes: Negative.   Respiratory: Positive for shortness of breath.   Cardiovascular: Positive for chest pain and leg swelling. Negative for palpitations.  Gastrointestinal: Positive for abdominal pain. Negative for nausea and vomiting.  Genitourinary: Negative.  Negative for dysuria.  Musculoskeletal: Negative for arthralgias, joint swelling and neck pain.  Skin: Negative.  Negative for rash and wound.  Neurological: Negative for dizziness, weakness, light-headedness, numbness and headaches.  Psychiatric/Behavioral: Negative.       Allergies  Haldol; Ibuprofen; Lyrica; and Ultram  Home Medications   Prior to Admission medications   Medication Sig Start Date End Date Taking? Authorizing Provider  albuterol (PROVENTIL HFA;VENTOLIN HFA) 108 (90 BASE) MCG/ACT inhaler Inhale 2 puffs into the lungs every 6 (six) hours as needed. For RESCUE.   Yes Historical Provider, MD  albuterol (PROVENTIL) (2.5 MG/3ML) 0.083% nebulizer solution Take 2.5 mg by nebulization every 6 (six) hours as needed for wheezing or shortness of breath.   Yes Historical Provider, MD  aspirin EC 81 MG tablet Take 81 mg by mouth daily.   Yes Historical Provider, MD  clonazePAM (KLONOPIN) 2 MG tablet Take 2 mg by mouth 4 (four) times daily as needed for anxiety. For anxiety   Yes Historical Provider, MD  cyclobenzaprine (FLEXERIL) 10 MG tablet Take 1 tablet (10 mg total) by mouth 3 (three) times daily as needed for muscle spasms. 09/06/13  Yes Eustace Moore, MD  docusate sodium (COLACE) 100 MG capsule Take 100 mg by mouth daily as needed for constipation.   Yes Historical Provider, MD  HYDROcodone-acetaminophen (NORCO) 10-325 MG per tablet Take 1-2 tablets by mouth every 6 (six) hours as needed for pain.    Yes Historical Provider, MD  olmesartan (BENICAR) 40 MG tablet Take 40 mg by mouth daily.   Yes  Historical Provider, MD  ondansetron (ZOFRAN) 4 MG tablet Take 1 tablet (4 mg total) by mouth every 8 (eight) hours as needed for nausea or vomiting. 02/06/14  Yes Butch Penny, NP  oxymorphone (OPANA ER) 40 MG 12 hr tablet Take 40 mg by mouth every 12 (twelve) hours.   Yes Historical Provider, MD  tiotropium (SPIRIVA) 18 MCG inhalation capsule Place 1 capsule (18 mcg total) into inhaler and inhale daily. 04/17/13  Yes Samuella Cota, MD  torsemide (DEMADEX) 20 MG tablet Take 20 mg by mouth 2 (two) times daily.   Yes Historical Provider, MD  lithium  carbonate (LITHOBID) 300 MG CR tablet Take 600 mg by mouth 2 (two) times daily.    Historical Provider, MD   BP 100/71  Pulse 89  Temp(Src) 97.8 F (36.6 C) (Oral)  Resp 11  Ht 5' 5.5" (1.664 m)  Wt 288 lb (130.636 kg)  BMI 47.18 kg/m2  SpO2 99% Physical Exam  Nursing note and vitals reviewed. Constitutional: She appears well-developed and well-nourished.  obese  HENT:  Head: Normocephalic and atraumatic.  Mouth/Throat: Oropharynx is clear and moist.  Eyes: Conjunctivae are normal.  Neck: Normal range of motion. Neck supple. No muscular tenderness present. No erythema and normal range of motion present.  Cardiovascular: Normal rate, regular rhythm, normal heart sounds and intact distal pulses.   Pulmonary/Chest: Effort normal and breath sounds normal. She has no decreased breath sounds. She has no wheezes. She has no rhonchi. She has no rales.  Abdominal: Soft. Bowel sounds are normal. There is no tenderness.  Musculoskeletal: Normal range of motion.  ttp superior right anterior knee and lower thigh.  No appreciable edema, no palpable cords, no erythema.  No ankle edema,  Dorsalis pedal pulses intact and full.  Neurological: She is alert.  Skin: Skin is warm and dry.  Psychiatric: Her mood appears anxious.    ED Course  Procedures (including critical care time) Labs Review Labs Reviewed  CBC WITH DIFFERENTIAL - Abnormal; Notable  for the following:    WBC 12.5 (*)    Neutro Abs 8.8 (*)    All other components within normal limits  COMPREHENSIVE METABOLIC PANEL - Abnormal; Notable for the following:    Chloride 95 (*)    Glucose, Bld 124 (*)    AST 47 (*)    ALT 63 (*)    All other components within normal limits  URINALYSIS, ROUTINE W REFLEX MICROSCOPIC - Abnormal; Notable for the following:    Specific Gravity, Urine <1.005 (*)    All other components within normal limits  TROPONIN I  PRO B NATRIURETIC PEPTIDE    Imaging Review Ct Angio Chest Pe W/cm &/or Wo Cm  02/09/2014   CLINICAL DATA:  Chest pain.  Short of breath.  EXAM: CT ANGIOGRAPHY CHEST WITH CONTRAST  TECHNIQUE: Multidetector CT imaging of the chest was performed using the standard protocol during bolus administration of intravenous contrast. Multiplanar CT image reconstructions and MIPs were obtained to evaluate the vascular anatomy.  CONTRAST:  175mL OMNIPAQUE IOHEXOL 350 MG/ML SOLN  COMPARISON:  02/09/2014 chest radiograph.  FINDINGS: Bones: No aggressive osseous lesions.  Cardiovascular: Aorta and branch vessels appear within normal limits. Negative for pulmonary embolism.  Lungs: No airspace disease. Mild dependent atelectasis. Scattered peripheral pulmonary nodules are present with the largest in the right lower lobe subpleural region (image 63 series 5) measuring 6 mm.  Central airways: Patent.  Effusions: None.  Lymphadenopathy: No axillary adenopathy. Residual thymic tissue is present in the anterior mediastinum. This has mixed soft tissue and fatty density. No discrete mass lesion. No mediastinal or hilar adenopathy.  Esophagus: Normal.  Upper abdomen: Fatty liver.  Other: Contrast filled venous collaterals are present in the left upper extremity, extending into the right chest.  Review of the MIP images confirms the above findings.  IMPRESSION: 1. No acute abnormality in the chest. 2. 6 mm right lower lobe pulmonary nodule. Other smaller peripheral  pulmonary nodules are present. If the patient is at high risk for bronchogenic carcinoma, follow-up chest CT at 6-12 months is recommended. If the patient is at  low risk for bronchogenic carcinoma, follow-up chest CT at 12 months is recommended. This recommendation follows the consensus statement: Guidelines for Management of Small Pulmonary Nodules Detected on CT Scans: A Statement from the [place] as published in Radiology 2005;237:395-400. 3. Fatty liver.   Electronically Signed   By: Dereck Ligas M.D.   On: 02/09/2014 23:53   Dg Chest Port 1 View  02/09/2014   CLINICAL DATA:  Chest pain and weakness.  EXAM: PORTABLE CHEST - 1 VIEW  COMPARISON:  Chest radiograph performed 12/09/2013  FINDINGS: The lungs are well-aerated. Minimal bilateral atelectasis is noted. There is no evidence of pleural effusion or pneumothorax.  The cardiomediastinal silhouette is borderline enlarged. No acute osseous abnormalities are seen.  IMPRESSION: Minimal bilateral atelectasis noted; borderline cardiomegaly.   Electronically Signed   By: Garald Balding M.D.   On: 02/09/2014 21:21     EKG Interpretation   Date/Time:  Sunday February 09 2014 20:23:39 EDT Ventricular Rate:  95 PR Interval:  150 QRS Duration: 91 QT Interval:  344 QTC Calculation: 432 R Axis:   64 Text Interpretation:  Sinus rhythm Normal ECG Confirmed by DELOS  MD,  DOUGLAS ([id number]) on 02/09/2014 8:34:38 PM      MDM   Final diagnoses:  Chest pain, atypical    Patients labs and/or radiological studies were viewed and considered during the medical decision making and disposition process.  Pt discussed with Dr. Stark Jock, labs reviewed.  Pt with a 4 day history of chest pain, perceived sob and right leg pain and edema although not appreciated on exam.  Ct angio is negative for PE.  Pulmonary nodule discussed, which patient was previously aware.  She became significantly more relaxed as her ed visit prolonged.  Sx could be at least partially due  to anxiety.  No evidence today for coronary event, PE, chf, pneumonia.  Only abnormal lab findings slightly increased LFT's. Per prior CT imaging, pt does have fatty liver hx.  She is followed for this by Dr Laural Golden.  She was encouraged to keep her appt with him this week.  Also encouraged recheck by her pcp this week if sx persist.    The patient appears reasonably screened and/or stabilized for discharge and I doubt any other medical condition or other [facility] requiring further screening, evaluation, or treatment in the ED at this time prior to discharge.     Evalee Jefferson, PA-C 02/10/14 1439

## 2014-02-10 NOTE — ED Notes (Signed)
Pt alert & oriented x4, stable gait. Patient  given discharge instructions, paperwork & prescription(s). Patient verbalized understanding. Pt left department w/ no further questions. 

## 2014-02-10 NOTE — Discharge Instructions (Signed)
Chest Pain (Nonspecific) °It is often hard to give a specific diagnosis for the cause of chest pain. There is always a chance that your pain could be related to something serious, such as a heart attack or a blood clot in the lungs. You need to follow up with your caregiver for further evaluation. °CAUSES  °· Heartburn. °· Pneumonia or bronchitis. °· Anxiety or stress. °· Inflammation around your heart (pericarditis) or lung (pleuritis or pleurisy). °· A blood clot in the lung. °· A collapsed lung (pneumothorax). It can develop suddenly on its own (spontaneous pneumothorax) or from injury (trauma) to the chest. °· Shingles infection (herpes zoster virus). °The chest wall is composed of bones, muscles, and cartilage. Any of these can be the source of the pain. °· The bones can be bruised by injury. °· The muscles or cartilage can be strained by coughing or overwork. °· The cartilage can be affected by inflammation and become sore (costochondritis). °DIAGNOSIS  °Lab tests or other studies, such as X-rays, electrocardiography, stress testing, or cardiac imaging, may be needed to find the cause of your pain.  °TREATMENT  °· Treatment depends on what may be causing your chest pain. Treatment may include: °· Acid blockers for heartburn. °· Anti-inflammatory medicine. °· Pain medicine for inflammatory conditions. °· Antibiotics if an infection is present. °· You may be advised to change lifestyle habits. This includes stopping smoking and avoiding alcohol, caffeine, and chocolate. °· You may be advised to keep your head raised (elevated) when sleeping. This reduces the chance of acid going backward from your stomach into your esophagus. °· Most of the time, nonspecific chest pain will improve within 2 to 3 days with rest and mild pain medicine. °HOME CARE INSTRUCTIONS  °· If antibiotics were prescribed, take your antibiotics as directed. Finish them even if you start to feel better. °· For the next few days, avoid physical  activities that bring on chest pain. Continue physical activities as directed. °· Do not smoke. °· Avoid drinking alcohol. °· Only take over-the-counter or prescription medicine for pain, discomfort, or fever as directed by your caregiver. °· Follow your caregiver's suggestions for further testing if your chest pain does not go away. °· Keep any follow-up appointments you made. If you do not go to an appointment, you could develop lasting (chronic) problems with pain. If there is any problem keeping an appointment, you must call to reschedule. °SEEK MEDICAL CARE IF:  °· You think you are having problems from the medicine you are taking. Read your medicine instructions carefully. °· Your chest pain does not go away, even after treatment. °· You develop a rash with blisters on your chest. °SEEK IMMEDIATE MEDICAL CARE IF:  °· You have increased chest pain or pain that spreads to your arm, neck, jaw, back, or abdomen. °· You develop shortness of breath, an increasing cough, or you are coughing up blood. °· You have severe back or abdominal pain, feel nauseous, or vomit. °· You develop severe weakness, fainting, or chills. °· You have a fever. °THIS IS AN EMERGENCY. Do not wait to see if the pain will go away. Get medical help at once. Call your local emergency services (911 in [place] Do not drive yourself to the hospital. °MAKE SURE YOU:  °· Understand these instructions. °· Will watch your condition. °· Will get help right away if you are not doing well or get worse. °Document Released: 05/25/2005 Document Revised: 11/07/2011 Document Reviewed: 03/20/2008 °[employer]® Patient Information ©2014 [employer]   [employer].   Your CT chest is negative for pulmonary embolus.  You do have a small nodule in your right lower lung which should be followed up by your doctor.  This may be residual changes from your pneumonia last fall.  Your other tests are stable tonight.

## 2014-02-12 ENCOUNTER — Ambulatory Visit (HOSPITAL_COMMUNITY)
Admission: RE | Admit: 2014-02-12 | Discharge: 2014-02-12 | Disposition: A | Discharge status: Home / Self Care | Payer: 59 | Source: Ambulatory Visit | Attending: Internal Medicine | Admitting: Internal Medicine

## 2014-02-12 ENCOUNTER — Encounter (HOSPITAL_COMMUNITY): Payer: Self-pay

## 2014-02-12 DIAGNOSIS — R1011 Right upper quadrant pain: Secondary | ICD-10-CM

## 2014-02-12 DIAGNOSIS — R160 Hepatomegaly, not elsewhere classified: Secondary | ICD-10-CM | POA: Insufficient documentation

## 2014-02-12 DIAGNOSIS — G8929 Other chronic pain: Secondary | ICD-10-CM

## 2014-02-12 DIAGNOSIS — R918 Other nonspecific abnormal finding of lung field: Secondary | ICD-10-CM | POA: Insufficient documentation

## 2014-02-12 DIAGNOSIS — R109 Unspecified abdominal pain: Secondary | ICD-10-CM | POA: Insufficient documentation

## 2014-02-12 MED ORDER — IOHEXOL 300 MG/ML  SOLN
100.0000 mL | Freq: Once | INTRAMUSCULAR | Status: AC | PRN
  Administered 2014-02-12: 100 mL via INTRAVENOUS

## 2014-02-12 NOTE — ED Provider Notes (Signed)
Medical screening examination/treatment/procedure(s) were performed by non-physician practitioner and as supervising physician I was immediately available for consultation/collaboration.   EKG Interpretation   Date/Time:  Sunday February 09 2014 20:23:39 EDT Ventricular Rate:  95 PR Interval:  150 QRS Duration: 91 QT Interval:  344 QTC Calculation: 432 R Axis:   64 Text Interpretation:  Sinus rhythm Normal ECG Confirmed by Beau Fanny  MD,  Jacorie Ernsberger ([id number]) on 02/09/2014 8:34:38 PM       Veryl Speak, MD 02/12/14 1502

## 2014-02-21 ENCOUNTER — Encounter (INDEPENDENT_AMBULATORY_CARE_PROVIDER_SITE_OTHER): Payer: Self-pay

## 2014-07-11 ENCOUNTER — Ambulatory Visit (HOSPITAL_COMMUNITY): Payer: 59 | Attending: Internal Medicine | Admitting: Physical Therapy

## 2014-11-04 ENCOUNTER — Other Ambulatory Visit (HOSPITAL_COMMUNITY): Payer: Self-pay | Admitting: Neurological Surgery

## 2014-11-04 DIAGNOSIS — S32009K Unspecified fracture of unspecified lumbar vertebra, subsequent encounter for fracture with nonunion: Secondary | ICD-10-CM

## 2014-11-11 ENCOUNTER — Ambulatory Visit (HOSPITAL_COMMUNITY): Payer: 59

## 2014-11-17 ENCOUNTER — Ambulatory Visit (HOSPITAL_COMMUNITY): Payer: 59

## 2014-11-18 ENCOUNTER — Ambulatory Visit (HOSPITAL_COMMUNITY): Payer: 59

## 2014-11-26 ENCOUNTER — Ambulatory Visit (HOSPITAL_COMMUNITY): Payer: 59

## 2015-01-23 ENCOUNTER — Ambulatory Visit (HOSPITAL_COMMUNITY): Admission: RE | Admit: 2015-01-23 | Payer: 59 | Source: Ambulatory Visit

## 2015-04-25 IMAGING — CT CT ABD-PELV W/ CM
2 of 4 series · 15 of 46 positions shown, 17 images · IV contrast (Omnipaque 300)
Comparison: September 20, 2012

CLINICAL DATA: Abdominal pain

EXAM:
CT ABDOMEN AND PELVIS WITH CONTRAST
TECHNIQUE: Multidetector CT imaging of the abdomen and pelvis was performed
using the standard protocol following bolus administration of
intravenous contrast. Oral contrast was also administered.
CONTRAST:  100mL OMNIPAQUE IOHEXOL 300 MG/ML  SOLN

[Series 2: abd_pel_with 5.0 b40f · axial · 0.76mm/px · z∈[-484,-44]mm · 12 of 98 slices shown, 14 images]
[im 5/98  soft-tissue]
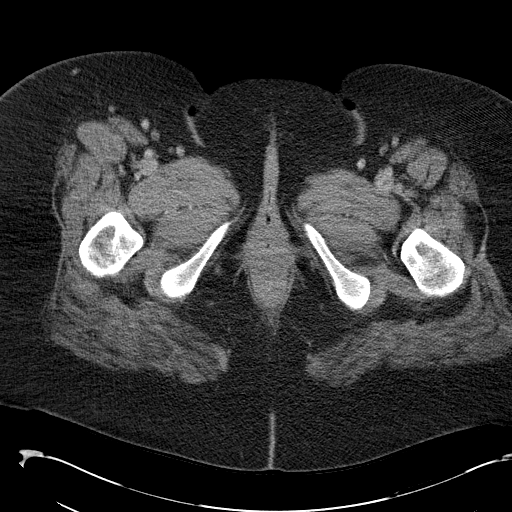
[im 5/98  bone]
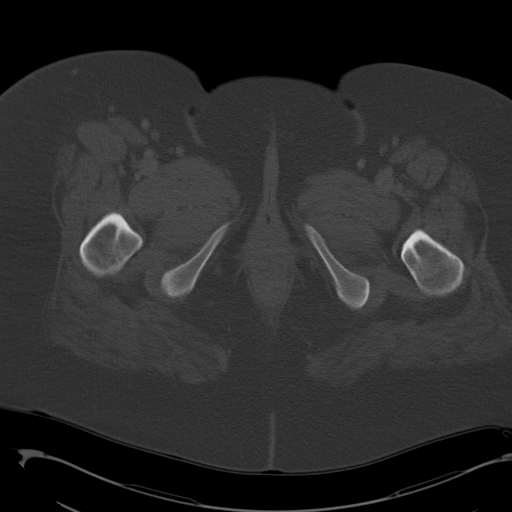
[im 14/98  soft-tissue]
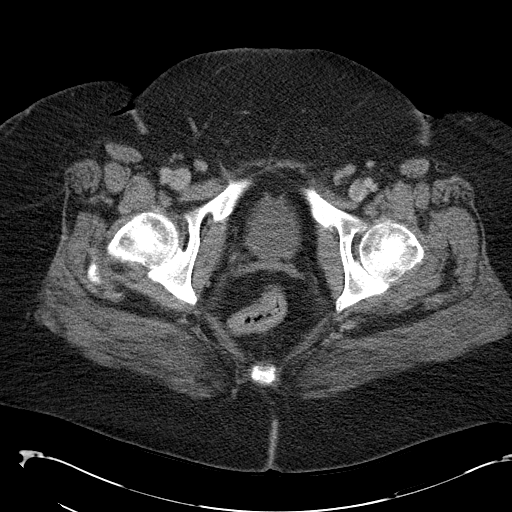
[im 24/98  soft-tissue]
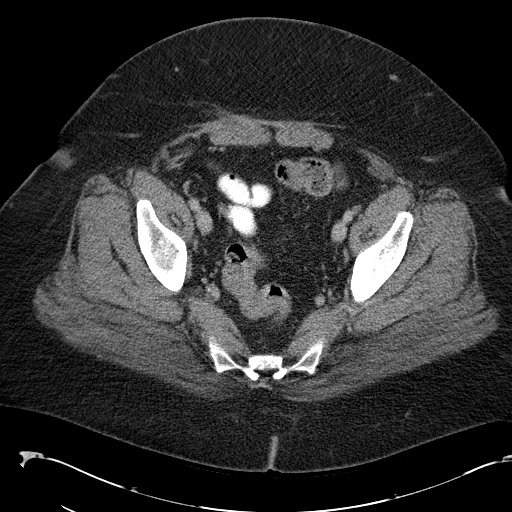
[im 28/98  soft-tissue]
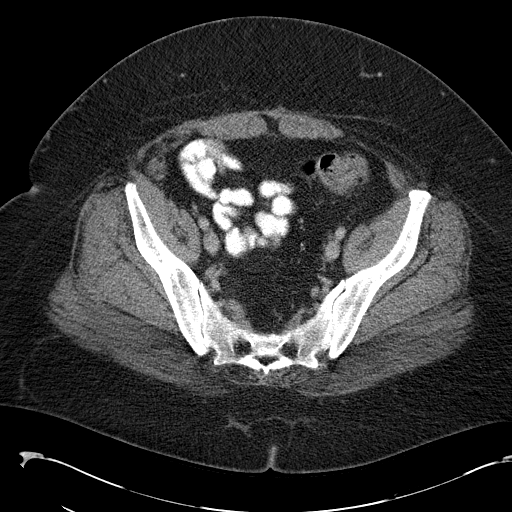
[im 37/98  soft-tissue]
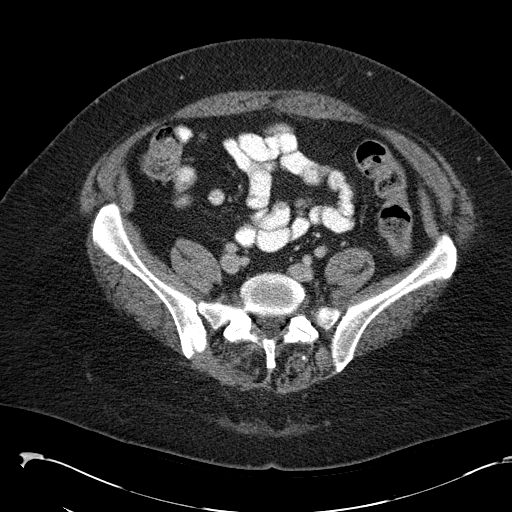
[im 47/98  soft-tissue]
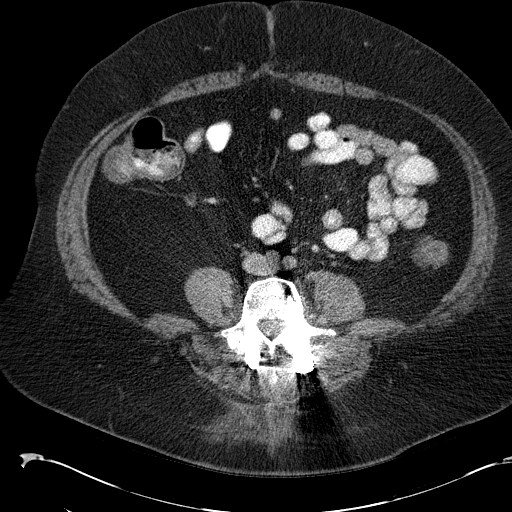
[im 51/98  soft-tissue]
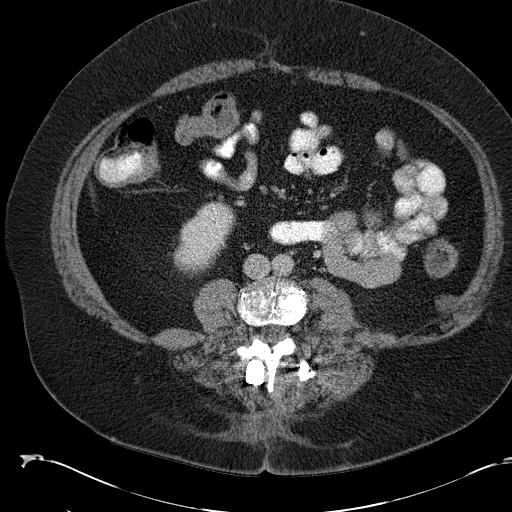
[im 61/98  soft-tissue]
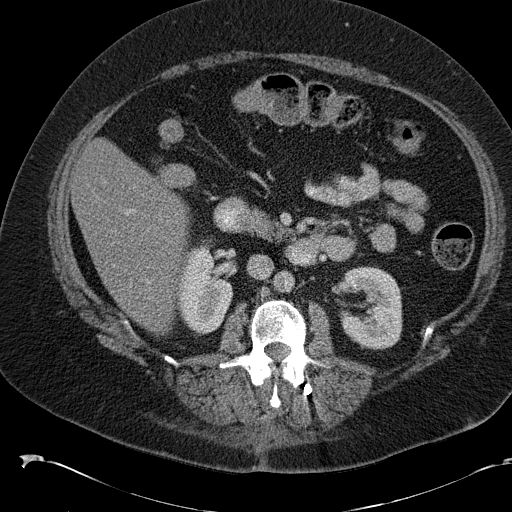
[im 70/98  soft-tissue]
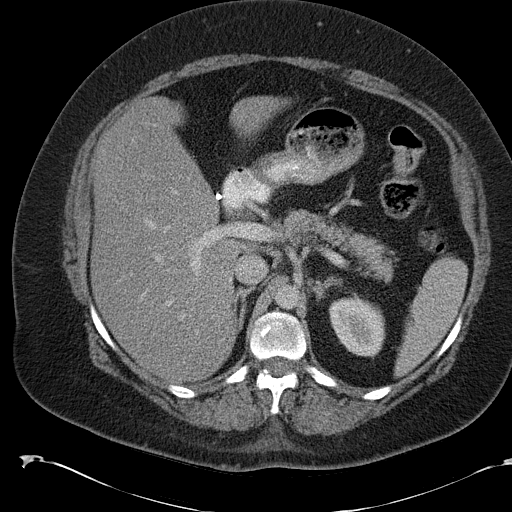
[im 70/98  bone]
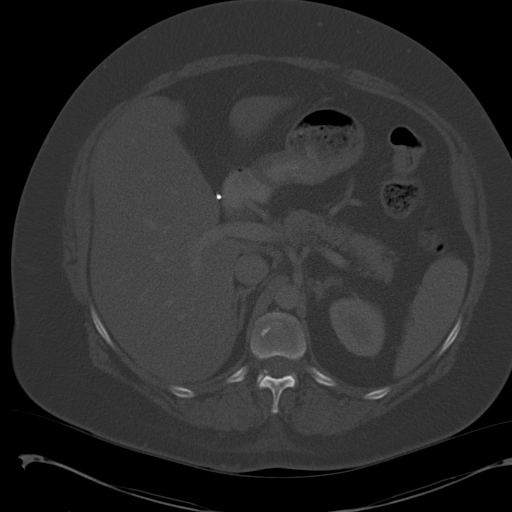
[im 74/98  soft-tissue]
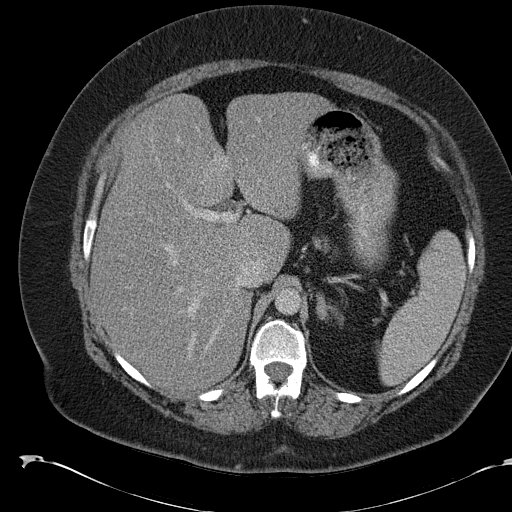
[im 84/98  soft-tissue]
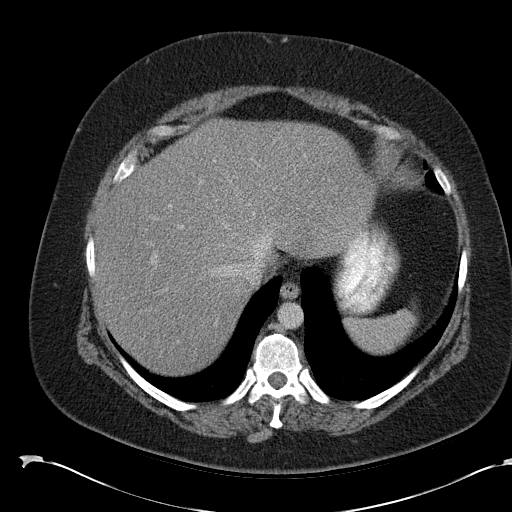
[im 93/98  soft-tissue]
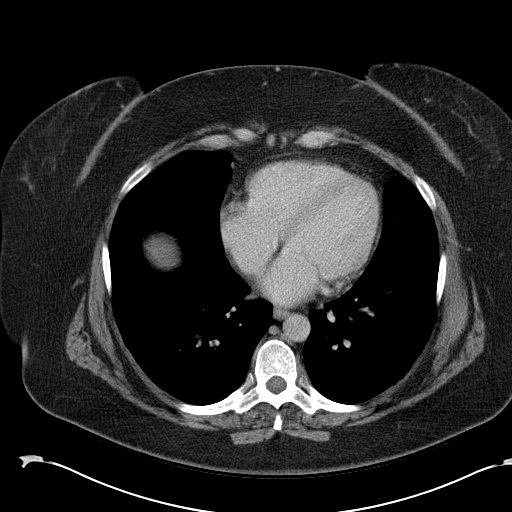

[Series 5: abd_pel_with 3.0 spo cor · coronal · 0.72mm/px · 3 of 119 slices shown]
[im 40/119  soft-tissue]
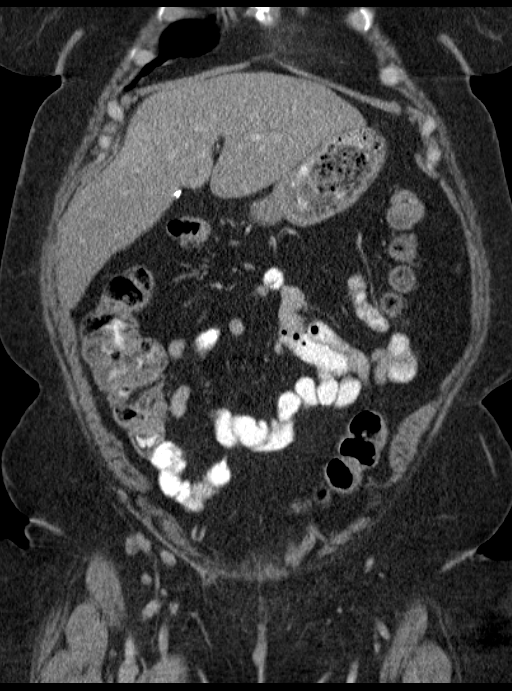
[im 53/119  soft-tissue]
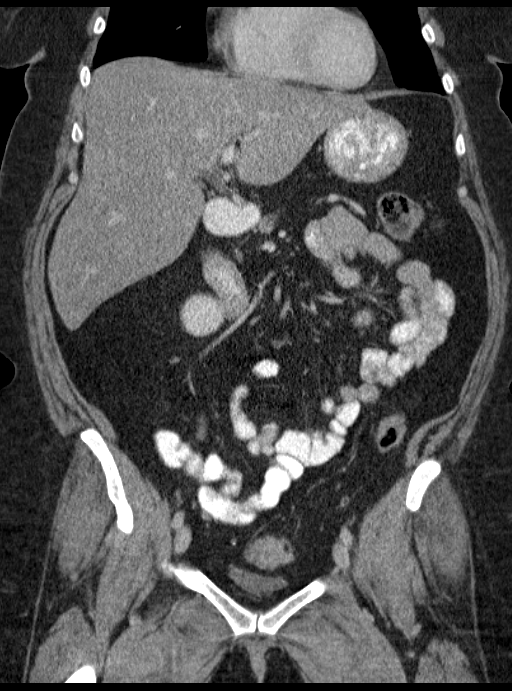
[im 66/119  soft-tissue]
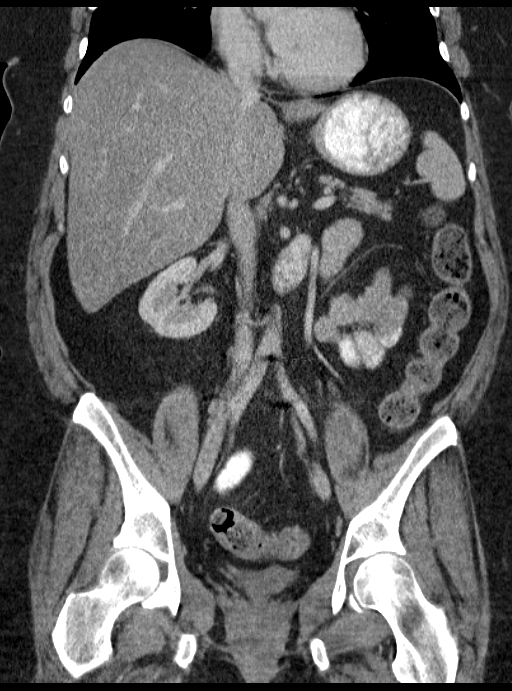

[15 of 46 positions shown; findings below may reference images not displayed]

FINDINGS: There is a 4 mm nodular opacity in the superior segment right lower
lobe on slice 3, series 3, stable. There is a 3 mm nodular opacity
abutting the pleura in the superior segment right lower lobe,
stable. Lung bases are otherwise clear.

The liver is prominent, measuring 20.0 cm in length. There is fatty
change in the liver. There is mild sparing of fatty change in the
liver near the gallbladder fossa region. No focal liver lesions are
identified. Gallbladder is absent. There is no biliary duct
dilatation.

Spleen, pancreas, and adrenals appear normal.

Kidneys bilaterally show no mass or hydronephrosis on either side.
There is no renal or ureteral calculus on either side.

In the interval since the prior study, the patient has undergone
repair of a lumbar type hernia on the left lateral to the left
kidney. There is some nonspecific soft tissue material in this area,
most likely representing scarring of postoperative etiology.

In the pelvis, the urinary bladder is largely decompressed. It is
located in the midline. The uterus and ovaries are absent. There is
no pelvic mass or fluid collection. Appendix is absent.

There is no bowel obstruction.  No free air or portal venous air.

There is no ascites, adenopathy, or abscess in the abdomen pelvis.
There is no evidence of abdominal aortic aneurysm. There is
postoperative change in the lumbar spine. There are no blastic or
lytic bone lesions.
IMPRESSION: The previously noted left-sided lumbar type hernia has undergone
repair. No residual hernia is seen. There is soft tissue material in
the area of the previous hernia consistent with the repair and some
scarring. No new hernia appreciable.

Liver is enlarged with fatty change. No focal liver lesions are
identified.

Gallbladder, uterus, ovaries, and appendix are absent. There is no
bowel obstruction. No abscess.

Small pulmonary nodular lesions in the right lower [place] stable.

## 2016-11-24 ENCOUNTER — Inpatient Hospital Stay: Admit: 2016-11-24 | Discharge: 2016-11-24 | Disposition: A | Discharge status: Home / Self Care | Payer: Self-pay

## 2017-01-05 ENCOUNTER — Encounter: Payer: Self-pay | Admitting: Gastroenterology

## 2017-01-17 ENCOUNTER — Telehealth: Payer: Self-pay | Admitting: Pulmonology

## 2017-01-17 DIAGNOSIS — R0600 Dyspnea, unspecified: Secondary | ICD-10-CM

## 2017-01-17 NOTE — Telephone Encounter (Signed)
Received referral via mail from Jerene BearsJagdesh Kandala, MD. Referral letter sent to referring.

## 2017-01-18 NOTE — Telephone Encounter (Signed)
[facility] new referral triage per clinical documentation review:    Does patient have unscanned outside records to review at [facility]? Yes  received on 01/17/17     Pertinent Medical History: obesity, COPD, current smoker , presumed OSA , chronic back pain,     Symptoms per chart review: SOB, edema     Current PA[facility] medications: na     PFTs:[facility] everywhere 01/13/17     Does patient have sleep apnea? 01/05/17     Standard type 3 polysomnogram was performed.  The tracings were personally   reviewed in their entirety.  There was snoring throughout the majority of the   study.  This was associated with mild but sustained desaturation in the upper   80s in a pattern suggesting hypoventilation.  The apnea-hypopnea index was   also elevated at 8.1.  During the final hours of the study, there was   significant sustained desaturation at approximately 80%.  Overall, study is   consistent with obstructive sleep apnea with an element of hypoventilation   either from obesity-hypoventilation or possible COPD, clinical correlation   required.  Would recommend laboratory positive airway pressure and possible   oxygen titration study      Echocardiogram: Date: 11/24/16  Location: [facility]   LV function: nml , EF: 45-50  %,  LA mild dilated   Focus on RV size: borderline enlged , RV function: nml , RA nml  Est PA systolic: 30-35    [facility]  Date: 1/61/09604/17/2018   Location: ?   MRA: 13,  RV: 41/8,  mPA: 30 , mPCWP: 16, CO: 7.3, CI: 3.3     Imaging:  VQ scan: na   CT scan: na     Scheduling requests from referring provider: na     Reason for referral: PAH eval       Clerical instructions:     Please check insurance to see if referral required [facility] and from R. Clement SayresJames White or Jeanella Caraaniel Lachant DO.     Get copies recent PFT testing    Instruct patient to get echo  images recorded on a disc and bring to [facility].  CPAP download faxed to our office 1-2 weeks prior to [facility] ( hasn't started CPAP yet)       Schedule UR echo and 6 MWT day of 60  min [facility] .  Suggest timing of [facility] in August to allow patient to acclimate to CPAP.     I will confirm plan with Dr's White and Lachant.   Clerical: close encounter once all new patient scheduling tasks are completed.

## 2017-01-19 NOTE — Telephone Encounter (Signed)
Left voicemail message for patient to call back. Please transfer to writer.

## 2017-01-23 NOTE — Telephone Encounter (Signed)
She needs more aggressive diuretics and CPAP.  Timing in July is appropriate.

## 2017-01-24 NOTE — Telephone Encounter (Addendum)
Left voicemail message for patient to call back. Advised will set up appointments to review with patient when she returns call. Please transfer to writer.    [facility]  [facility] scheduled 03/06/17 1:00 pm [facility]    [facility] and appointment details sent via mail.

## 2017-02-22 NOTE — Telephone Encounter (Signed)
Left voicemail message for patient advising appointments will not be rescheduled until she contacts PA[facility] office to schedule.

## 2017-02-22 NOTE — Telephone Encounter (Signed)
Left voicemail message for patient to advise that appointments will be moved to 03/09/17.  Request that patient call back. Rescheduled echo and [facility]. Appointment details sent via mail.     Spoke with Herbert SetaHeather in referring office. Advised that patient has not responded in regards to a scheduled appointment. Advised will need to move patient's appointment, however, writer does not want to reschedule if the patient does not plan on coming. Herbert SetaHeather will attempt to contact the patient and call back.

## 2017-02-22 NOTE — Telephone Encounter (Signed)
Christine Patton called to reschedule her new patient appointment. She is requesting a call back at [phone].

## 2017-02-23 NOTE — Telephone Encounter (Signed)
Spoke with patient. Scheduled patient 04/20/17 with [facility]. [facility]/17 [facility].    Patient requests a [employer] reservation with the check in date of 04/20/17. Request faxed. NPV packet with appointment details mailed to patient.

## 2017-02-23 NOTE — Telephone Encounter (Signed)
Ms. Christine Patton called to reschedule her NPV appointment. Please contact her at home at [phone]

## 2017-03-06 ENCOUNTER — Other Ambulatory Visit: Payer: PRIVATE HEALTH INSURANCE | Admitting: Cardiology

## 2017-03-06 ENCOUNTER — Ambulatory Visit: Payer: PRIVATE HEALTH INSURANCE | Admitting: Pulmonology

## 2017-03-06 ENCOUNTER — Ambulatory Visit: Payer: PRIVATE HEALTH INSURANCE

## 2017-03-06 ENCOUNTER — Other Ambulatory Visit: Payer: PRIVATE HEALTH INSURANCE

## 2017-03-09 ENCOUNTER — Ambulatory Visit: Payer: PRIVATE HEALTH INSURANCE | Admitting: Pulmonology

## 2017-04-20 ENCOUNTER — Ambulatory Visit
Admission: RE | Admit: 2017-04-20 | Discharge: 2017-04-20 | Disposition: A | Discharge status: Home / Self Care | Payer: PRIVATE HEALTH INSURANCE | Source: Ambulatory Visit | Attending: Cardiology | Admitting: Cardiology

## 2017-04-20 ENCOUNTER — Other Ambulatory Visit
Admission: RE | Admit: 2017-04-20 | Discharge: 2017-04-20 | Disposition: A | Discharge status: Home / Self Care | Payer: PRIVATE HEALTH INSURANCE | Source: Ambulatory Visit | Attending: Pulmonology | Admitting: Pulmonology

## 2017-04-20 ENCOUNTER — Ambulatory Visit: Payer: PRIVATE HEALTH INSURANCE | Attending: Pulmonology

## 2017-04-20 ENCOUNTER — Ambulatory Visit: Payer: PRIVATE HEALTH INSURANCE | Admitting: Pulmonology

## 2017-04-20 VITALS — BP 112/58 | HR 105 | Ht 65.5 in | Wt 239.0 lb

## 2017-04-20 DIAGNOSIS — G4733 Obstructive sleep apnea (adult) (pediatric): Secondary | ICD-10-CM

## 2017-04-20 DIAGNOSIS — R0600 Dyspnea, unspecified: Secondary | ICD-10-CM

## 2017-04-20 DIAGNOSIS — I5030 Unspecified diastolic (congestive) heart failure: Secondary | ICD-10-CM

## 2017-04-20 LAB — CBC
Hematocrit: 49 % — ABNORMAL HIGH (ref 34–45)
Hemoglobin: 15.5 g/dL (ref 11.2–15.7)
MCH: 29 pg/cell (ref 26–32)
MCHC: 32 g/dL (ref 32–36)
MCV: 91 fL (ref 79–95)
Platelets: 452 10*3/uL — ABNORMAL HIGH (ref 160–370)
RBC: 5.3 MIL/uL — ABNORMAL HIGH (ref 3.9–5.2)
RDW: 13.1 % (ref 11.7–14.4)
WBC: 11.4 10*3/uL — ABNORMAL HIGH (ref 4.0–10.0)

## 2017-04-20 LAB — ECHO COMPLETE
Aortic Arch Diameter: 2.9 cm
Aortic Diameter (mid tubular): 2.9 cm
Aortic Diameter (sinus of Valsalva): 3.1 cm
BMI: 39.5 kg/m2
BP Diastolic: 75 mmHg
BP Systolic: 120 mmHg
BSA: 2.22 m2
Deceleration Time - MV: 159 ms
E/A ratio: 1.21
Heart Rate: 82 {beats}/min
Height: 65 in
IVC Diameter: 10.8 mm
LA Diameter BSA Index: 1.6 cm/m2
LA Diameter Height Index: 2.2 cm/m
LA Diameter: 3.6 cm
LA Systolic Vol BSA Index: 28.3 mL/m2
LA Systolic Vol Height Index: 38 mL/m
LA Systolic Volume: 62.8 mL
LV ASE Mass BSA Index: 74 gm/m2
LV ASE Mass Height 2.7 Index: 42.4 gm/m2.7
LV ASE Mass Height Index: 99.5 gm/m
LV ASE Mass: 164.3 gm
LV CO BSA Index: 3.1 L/min/m2
LV Cardiac Output: 6.89 L/min
LV Diastolic Volume Index: 59.5 mL/m2
LV Posterior Wall Thickness: 0.9 cm
LV SV - LVOT SV Diff: 23.46 mL
LV SV BSA Index: 37.8 mL/m2
LV SV Height Index: 50.9 mL/m
LV Septal Thickness: 1 cm
LV Stroke Volume: 84 mL
LV Systolic Volume Index: 21.6 mL/m2
LVED Diameter BSA Index: 2.2 cm/m2
LVED Diameter Height Index: 3 cm/m
LVED Diameter: 4.9 cm
LVED Volume BSA Index: 59 ml/m2
LVED Volume BSA Index: 59.5 mL/m2
LVED Volume Height Index: 80 mL/m
LVED Volume: 132 mL
LVEF (Volume): 64 %
LVES Diameter BSA Index: 1.4 cm/m2
LVES Diameter Height Index: 1.8 cm/m
LVES Diameter: 3 cm
LVES Volume BSA Index: 21.6 mL/m2
LVES Volume BSA Index: 22 ml/m2
LVES Volume Height Index: 29.1 mL/m
LVES Volume: 48 mL
LVOT Area (calculated): 3.56 cm2
LVOT Cardiac Index: 2.24 L/min/m2
LVOT Cardiac Output: 4.96 L/min
LVOT Diameter: 2.13 cm
LVOT PWD VTI: 17 cm
LVOT PWD Velocity (mean): 62.1 cm/s
LVOT PWD Velocity (peak): 96.7 cm/s
LVOT SV BSA Index: 27.27 mL/m2
LVOT SV Height Index: 36.7 mL/m
LVOT Stroke Rate (mean): 221.2 mL/s
LVOT Stroke Rate (peak): 344.4 mL/s
LVOT Stroke Volume: 60.54 cc
MR Regurgitant Fraction (LV SV Mtd): 0.28
MR Regurgitant Volume (LV SV Mtd): 23.5 mL
MV Peak A Velocity: 58.4 cm/s
MV Peak E Velocity: 70.6 cm/s
Mitral Annular E/Ea Vel Ratio: 12.77
Mitral Annular Ea Velocity: 5.53 cm/s
RA Pressure Estimate: 5 mmHg
RA Volume BSA Index: 22.5 mL/m2
RA Volume Height Index: 30.3 mL/m
RA Volume: 50 mL
RR Interval: 731.71 ms
RVED Diameter BSA Index: 1.9 cm/m2
RVED Diameter Height Index: 2.6 cm/m
RVED Diameter: 4.27 cm
Tricuspid Annular Systolic Plane Excursion (TAPSE): 1.6 cm
Weight (lbs): 236.6 [lb_av]
Weight: 3785.6 oz

## 2017-04-20 LAB — NT-PRO BNP: NT-pro BNP: 57 pg/mL (ref 0–450)

## 2017-04-20 LAB — FERRITIN: Ferritin: 130 ng/mL — ABNORMAL HIGH (ref 10–120)

## 2017-04-20 LAB — BASIC METABOLIC PANEL
Anion Gap: 15 (ref 7–16)
CO2: 33 mmol/L — ABNORMAL HIGH (ref 20–28)
Calcium: 9.7 mg/dL (ref 8.6–10.2)
Chloride: 89 mmol/L — ABNORMAL LOW (ref 96–108)
Creatinine: 0.74 mg/dL (ref 0.51–0.95)
GFR,Black: 111 *
GFR,Caucasian: 96 *
Glucose: 120 mg/dL — ABNORMAL HIGH (ref 60–99)
Lab: 21 mg/dL — ABNORMAL HIGH (ref 6–20)
Potassium: 3.9 mmol/L (ref 3.3–5.1)
Sodium: 137 mmol/L (ref 133–145)

## 2017-04-20 LAB — TIBC
Iron: 86 ug/dL (ref 34–165)
TIBC: 394 ug/dL (ref 250–450)
Transferrin Saturation: 22 % (ref 15–50)

## 2017-04-20 MED ORDER — SODIUM CHLORIDE 0.9 % INJ (FLUSH) WRAPPED *I*
10.0000 mL | Status: AC | PRN
Start: 2017-04-20 — End: 2017-04-20
  Administered 2017-04-20: 10 mL via INTRAVENOUS

## 2017-04-20 MED ORDER — SPIRONOLACTONE 25 MG PO TABS *I*
25.0000 mg | ORAL_TABLET | Freq: Every day | ORAL | 3 refills | Status: DC
Start: 2017-04-20 — End: 2018-01-30

## 2017-04-20 MED ORDER — AUTO-TITRATING CPAP MACHINE *A*
0 refills | Status: AC
Start: 2017-04-20 — End: ?

## 2017-04-20 MED ORDER — TORSEMIDE 20 MG PO TABS *I*
40.0000 mg | ORAL_TABLET | Freq: Every morning | ORAL | Status: AC
Start: 2017-04-20 — End: ?

## 2017-04-20 MED ORDER — SULFUR HEXAFLUORIDE MICROSPH 60.7-25 MG (LUMASON) IV SUSR *I*
2.0000 mL | INTRAMUSCULAR | Status: AC | PRN
Start: 2017-04-20 — End: 2017-04-20
  Administered 2017-04-20: 2 mL via INTRAVENOUS

## 2017-04-20 NOTE — Progress Notes (Addendum)
Reason for visit: dyspnea/CP started about a yr ago hunting season.  She also doesn't want to have to use o2 all the time.  Her o2 sat is good off oxygen   HR rises and she has CP both with and without exertion  Needs (Rx's, DME changes, orders): none     BREATHLESSNESS/FATIGUE    With dressing or showering?mild  With walking room to room?mild  With light housework (dusting, dishes)?mild  With heavier housework (changing beds, vacuuming, laundry)? Moderate  With lifting or carrying laundry baskets or groceries? Moderate  With walking in a store?mild uses cart at times    With steps or inclines? 13 stairs Stop at the top of steps? Yes Need to rest on the steps before the top?yes  Limits daily activities more than joint pain or weakness? Severe back pain requires rest more than dyspnea    Other PAH symptoms with exertion   Chest pressure with exertion?mild to severe but unpredictable (I do not think angina)  Dizziness with exertion? mild  Exertional presyncope?no    Abdominal bloating per patient? Severe  Edema per patient? Moderate  Orthopnea? No     Does patient check a daily morning weight ? No  Does the patient use CPAP/BiPAP?was told has sleep apnea but not yet on auto-CPAP    Does the patient use oxygen? 2L     Does patient track daily fluid intake? Yno  Does the patient limit dietary sodium? Yes

## 2017-04-20 NOTE — Progress Notes (Signed)
Dear Dr. 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  I met the patient with her wife today at our [facility] office.      My summary finding and recommendation: Her heart catheterization clearly shows pulmonary venous hypertension, and there is no gradient across the resting pulmonary circulation.  Her exercise tolerance in the office today is clearly limited by cardiopulmonary disease and she had a heart rate of 150 with hallway walking.  I do believe that this is all about a very stiff left ventricle: a combination of untreated obstructive sleep apnea and obesity.  She seems motivated for a program of graded physical activity, calorie restriction, and CPAP.  We will get her set up with an auto CPAP machine in the next week or 2 and hopefully my colleague Dr. Ilda Foil can help review that data and schedule an in lab CPAP titration if necessary.  She is iron deficient and we have found that patients as symptomatic as her often benefit from IV iron with regard to exercise tolerance and symptoms.  The data in systolic heart failure are quite compelling that this is effective and safe.  Because she has no gradient across the resting pulmonary circulation, a pulmonary vasodilator is not indicated. She left the visit with strong sense of confidence.    She is an enjoyable 46 year old who has been disabled because of back pain, and in fact that it is back pain that limits most of her activity.  Even though she has chest discomfort on occasion with activity, this is not consistent and more often than not her activity will be limited by back pain, not chest discomfort or breathlessness.  She has been obese for years and would certainly like to be more physically active, but is limited  by her back pain.  She is not having exertional presyncope nor severe orthopnea.  She has been evaluated for obstructive sleep apnea, and the details are below.  She clearly has profound fatigue and poor quality rest is manifestations of sleep apnea.  She has not yet begun CPAP therapy.  Complete symptom assessment is separately recorded, but I would place her symptoms in the late part of class II while I acknowledge that her back pain makes this a limited assessment.     The patient doesn't have skin, joint, or GI manifestations to suggest the scleroderma spectrum disease. There is no personal or family history of thrombosis and no family history for collagen vascular disease or pulmonary hypertension. The patient doesn't have signs or symptoms of cirrhosis, and there are no obvious risk factors for HIV.  The patient never used anorexigens of any sort.    Together we reviewed all the details found in a comprehensive, self-reported health dataset which we reviewed in its entirety.  We updated all of the medications in our EMR system, and I updated the problem list with details about her health conditions pertinent to our evaluation.  A current medication list is reflected below.    The patient's resting vital signs and a complete exam are separately documented.  She had regular cardiac tones without particular prominence to her P2.  She had clear bilateral breath sounds without crackle or wheeze.  She did not have a prolonged expiratory phase.  She had trim  She did not have skin stigmata of scleroderma nor did she have acute or chronic inflammatory arthritis changes.    We had the patient perform a standardized walk as a formal measure of exercise tolerance and vital sign changes with exercise. The patient walked 1445 feet during 6 minutes without pausing or stopping early.  Her blood pressure started in the normal range and increased appropriately for hallway exercise.  She started out  tachycardic at rest and had a greatly exaggerated rise in her heart rate 153 with exertion.  I think this is her manifestation of heart failure preserved ejection fraction (stiff left ventricle with low stroke volume).  Her oxyhemoglobin saturation stayed in the normal range on 2 L supplemental oxygen.  Today she did not have angina or dizziness with exertion although sometimes she does have chest discomfort..  The patient experienced mild-moderate dyspnea with a precise Borg score of 2.  Considering the reported dyspnea, associated symptoms, vital sign changes, and absolute distance walked, the exercise tolerance is moderate limited.  This limitation is related to heart failure preserved ejection fraction (as evidenced by her severe tachycardia), obesity, and back discomfort which she stated was worse than breathlessness.    We also reviewed the results of relevant cardiac, pulmonary, and laboratory testing recently performed and separately documented in the medical record including an [facility] from [facility] which we reviewed together while she was in the office and compared to images from [facility] in March.  On the apical four-chamber view, the RV is mildly enlarged and shares the apex with the LV.  However in all of the views the RV is smaller than the LV.  There is vigorous RV function in both the longitudinal and circumferential directions.  Right atrium is normal in size and collapses completely during the respiratory cycle.  Multiple planes of short axis imaging show normal septal position without any flattening during diastole.  There may be minimal systolic septal flattening.  This is one thing that appears better today than at [facility] in March.  At that time she was 20 pounds heavier and not yet on spironolactone.  Her septum (SAX) does appear more flat in the [facility] images.  Right heart catheterization at [facility] in April showed an RA pressure elevated at 13 mmHg, mean PA pressure 30  mmHg, and PA diastolic of 12 mmHg.  Her LVEDP was elevated at 20 mmHg and the wedge pressure was likewise elevated at 16 mmHg.  Thus her PA diastolic was actually measured lower than her wedge and lower than her LVEDP (probably PAD = PCW = LVEDP).  Thus there is no gradient across the resting pulmonary circulation.  Cardiac index was high normal.  Epicardial vessels were normal on angiography.  Recent laboratories from April at [facility] show a normal hemoglobin, normal GFR, and normal electrolytes.  She hasn't recently had iron stores.  Cardiac perfusion study in January 2018 showed normal perfusion, normal LV systolic function, and normal LV volume.  An unattended home study at [facility] apparently shows an AHI 8 with sustained desaturation to 80%.  She has not had an in lab titration study.  She has not trialed auto CPAP at home.  I don't have detailed report about the length of the events.  These unattended studies almost always underestimate the severity of sleep apnea.  She is scheduled for full pulmonary function testing but as far as I can tell has not had it.    In consideration of the patient's current   of the patient's current symptoms, the documented physical exam, and the recent invasive and noninvasive testing, together we made the changes already outlined in the summary.  Please see the "After Visit Summary" section below for the detailed written instructions which we gave the patient about medication changes and followup labs or testing.  This also includes detailed written instructions about the management of heart failure    Please do not hesitate to contact me at [phone] if I can be of assistance.  Your patient is always welcome in the office, but I don't think that regular follow up with me is necessary.  She is getting excellent [facility] at [facility].    Thanks again for referring your patient,    Sherine Cortese Sharrell Ku, MD    Medication List:  No current outpatient prescriptions on file prior to visit.     No current  facility-administered medications on file prior to visit.        Annotated Problem List:  Patient Active Problem List    Diagnosis Date Noted    (HFpEF) heart failure with preserved ejection fraction 04/20/2017     Priority: High     12/2016 Jaclyn Andy/[facility]): LVEDP 20, PCW16; PAD 12      OSA (obstructive sleep apnea) 04/20/2017     Priority: Medium     Symptoms for 20 years  12/2016 Layne Benton [facility] ([facility]): uAHI 8; desats          For the purposes of insurance documentation, I note that the medical decision making involved in the care of this patient with such complex cardiopulmonary problems is clearly at the highest level. Total face-to-face contact time with this patient was approximately 40 minutes.  Physical Exam   Constitutional: She is oriented to person, place, and time. She appears well-nourished. No distress.   HENT:   Mouth/Throat: Oropharynx is clear and moist. No oropharyngeal exudate.   Eyes: Conjunctivae and EOM are normal. Pupils are equal, round, and reactive to light. No scleral icterus.   Neck: Normal range of motion. No tracheal deviation present.   Abdominal: Soft. She exhibits no distension. There is no tenderness.   Musculoskeletal: She exhibits no edema or tenderness.   Neurological: She is alert and oriented to person, place, and time. No cranial nerve deficit. Coordination normal.   Skin: Skin is warm and dry. No pallor.   Psychiatric: She has a normal mood and affect. Her behavior is normal. Judgment and thought content normal.   Vitals reviewed.

## 2017-04-21 ENCOUNTER — Telehealth: Payer: Self-pay | Admitting: Pulmonology

## 2017-04-21 ENCOUNTER — Other Ambulatory Visit: Payer: Self-pay

## 2017-04-21 NOTE — Telephone Encounter (Signed)
Spoke with Marchelle Folks in referring office. Will fax patient's complete sleep study results.

## 2017-04-21 NOTE — Telephone Encounter (Signed)
Received. Given to RT.

## 2017-04-25 ENCOUNTER — Telehealth: Payer: Self-pay | Admitting: Pulmonology

## 2017-04-25 NOTE — Telephone Encounter (Signed)
Needs phone assessment: changes to diuretics when in office.  Per Dr. Cliffton Asters should have IV iron.    Repeat labs in 2 weeks (BMP)- can do same day as IV iron if she chooses HH.

## 2017-04-26 ENCOUNTER — Encounter: Payer: Self-pay | Admitting: Gastroenterology

## 2017-04-26 NOTE — Telephone Encounter (Signed)
Patient complaints:  I spoke to Jimika, she said her attitude has been better since she left her app't with [place].  She said all around she is feeling a lot better.  Physically she is doing well.  She is off the oxygen during the day.  She has also been going for walks with the dog.  The heat does get to her a little bit.  Agrees to IV IRon, sent email and fax for IRON AND BMP for 2 weeks.    Endurance assessment:   Dyspnea with ADLs?  Slight is better  Walking room-room in house?better      Stairs/ inclines? N/a  Exertional Dizziness?no   Dizziness with position changes? no  Edema? no Bloating?yes   Fatigue? Better      Weight (on home scale): 239lb   Compared to 239lb    OSA Still has not received CPAP yet  Diuretics Prescribed?  Spironolactone 25 mg; daily, Torsemide  40mg   daily    Daily fluid intake : <64 oz  Attention to sodium restriction of <3000 mg daily.       Most recent labs:will get with IV IRON     Plan:  I will share assessment with Dr Cliffton Asters

## 2017-04-28 ENCOUNTER — Encounter: Payer: Self-pay | Admitting: Pulmonology

## 2017-04-28 ENCOUNTER — Telehealth: Payer: Self-pay | Admitting: Pulmonology

## 2017-04-28 NOTE — Telephone Encounter (Signed)
[facility]unguia [facility]. She stated the infusion has been scheduled for 2PM on the 9/12 (See patient email encounter 04/28/17). She stated she needs accommodations for 9/11 and 9/12. She also wants to discuss her CPAP as well. She is requesting a call back at [phone] or [phone] to discuss.

## 2017-04-28 NOTE — Telephone Encounter (Addendum)
I will ask Lucile ShuttersJocelyn Dixon to make this [employer] reservation request next week.  Check in 05/09/17, check out 05/11/17.  Patient is responsible for payment and understands this.    CPAP: she does not have this yet, I asked her to call [employer] and check on this. If she has a problem I will call [employer] on her behalf.

## 2017-05-01 ENCOUNTER — Encounter: Payer: Self-pay | Admitting: Pulmonology

## 2017-05-04 ENCOUNTER — Encounter: Payer: Self-pay | Admitting: Gastroenterology

## 2017-05-05 ENCOUNTER — Encounter: Payer: Self-pay | Admitting: Pulmonology

## 2017-05-08 DIAGNOSIS — E611 Iron deficiency: Secondary | ICD-10-CM | POA: Insufficient documentation

## 2017-05-10 ENCOUNTER — Ambulatory Visit: Payer: PRIVATE HEALTH INSURANCE | Admitting: Oncology

## 2017-05-10 ENCOUNTER — Ambulatory Visit: Payer: PRIVATE HEALTH INSURANCE | Attending: Oncology | Admitting: Oncology

## 2017-05-10 VITALS — BP 139/76 | HR 103 | Temp 97.5°F | Wt 241.0 lb

## 2017-05-10 DIAGNOSIS — D508 Other iron deficiency anemias: Secondary | ICD-10-CM | POA: Insufficient documentation

## 2017-05-10 DIAGNOSIS — E611 Iron deficiency: Secondary | ICD-10-CM

## 2017-05-10 MED ORDER — SODIUM CHLORIDE 0.9 % IV SOLN WRAPPED *I*
30.0000 mL/h | Status: DC | PRN
Start: 2017-05-10 — End: 2017-05-10
  Administered 2017-05-10: 30 mL/h via INTRAVENOUS

## 2017-05-10 MED ORDER — SODIUM CHLORIDE 0.9 % IV SOLN WRAPPED *I*
510.0000 mg | Freq: Once | INTRAVENOUS | Status: AC
Start: 2017-05-10 — End: 2017-05-10
  Administered 2017-05-10: 510 mg via INTRAVENOUS
  Filled 2017-05-10: qty 17

## 2017-05-10 NOTE — Progress Notes (Signed)
[facility] Infusion Room at [facility] PA Note  05-10-17  History  Christine Patton is a 46 y/o woman sent to the infusion room by Dr Clement Sayres for 2 infusions of Feraheme to treat Iron Deficiency in setting of Pulmonary Venous Hypertension diagnosed 2018, obesity, obstructive Sleep Apnea diagnosed 2018.    Patient reports current symptoms:      Shortness of breath: Able to walk on a flat surface about a quarter mile before becoming SOB;   Becomes SOB climbing 1 flight of stairs or carrying groceries or laundry    Cough or sputum: No,No  Chest pain, palpitations: + chest pain   Has improved since increased dose of Aldactone. ; + palptations  Lower extremity edema: Yes if does not take water pills  Syncope, lightheadedness: little lightheadedness which she attributes to her diuretics  Hair, skin, or nail changes:No  Change in visual acuity or blurriness of vision, eye redness, or pain: No  GERD symptoms: On Prilosec BID   Dysphagia or aspiration: Needs to drink liquds to help swallow ( problem is throat)  Joint swelling, erythema, tenderness: little joint swelling in hands: and she reports she " is a walking hardware store in back"  Myositis or myalgias: back and legs  Neuropathy, numbness, tingling, weakness, or other neurologic problems: numbness tingling in hands from back problems not feet  Fevers, nights sweats, weight loss or fatigue: + fatigue and night sweats x 2 years  Renal dysfunction: no  Hepatic dysfunction:  No except fatty liver     Sleep disturbances: up all hours of the night ; sleeps 2 hrs during the day +  Activity limitations: SOB and fatigue  Oxygen requirements: 2 L/min NC  Appetite & Weight:good-poor depending on the day;   in the last 6 months lost 20 lbs by diet    PICA: extreme cold things  Restless legs: yes  Bleeding:No    Allergies   Allergen Reactions    Ciprofloxacin Dermatitis    Haloperidol Nausea And Vomiting    Oxycodone-Acetaminophen Other (See Comments)     Hyper- "drunk"     Pregabalin Other (See Comments)     Patient states it makes her feel "drunk"       Current Outpatient Prescriptions:     albuterol HFA 108 (90 Base) MCG/ACT inhaler, Inhale 180 mcg into the lungs, Disp: , Rfl:     VENTOLIN HFA 108 (90 Base) MCG/ACT inhaler, , Disp: , Rfl:     aspirin (ASPIRIN EC) 81 MG EC tablet, Take 81 mg by mouth, Disp: , Rfl:     clonazePAM (KLONOPIN) 2 MG tablet, Take 2 mg by mouth, Disp: , Rfl:     HYDROcodone-acetaminophen (NORCO) 10-325 MG per tablet, , Disp: , Rfl:     diclofenac (VOLTAREN) 1 % gel, Apply 4 g topically, Disp: , Rfl:     STIOLTO RESPIMAT 2.5-2.5 MCG/ACT AERS, , Disp: , Rfl:     tiZANidine (ZANAFLEX) 4 MG tablet, , Disp: , Rfl:     omeprazole (PRILOSEC) 40 MG capsule, , Disp: , Rfl:     oxymorphone (OPANA ER) 40 MG 12 hr tablet, Take 40-80 mg by mouth, Disp: , Rfl:     losartan (COZAAR) 50 MG tablet, Take 50 mg by mouth daily, Disp: , Rfl:     isosorbide mononitrate (IMDUR) 30 MG 24 hr tablet, Take 30 mg by mouth daily, Disp: , Rfl:     torsemide (DEMADEX) 20 MG tablet, Take 2 tablets (40 mg total)  by mouth every morning, Disp: , Rfl:     spironolactone (ALDACTONE) 25 MG tablet, Take 1 tablet (25 mg total) by mouth daily, Disp: 90 tablet, Rfl: 3    auto-titrating CPAP (AUTOSET) machine, 6-14 cm;  Dx: OSA, Disp: 1 each, Rfl: 0    Exam:  Vitals:    05/10/17 1355   BP: 139/76   Pulse: 103   Temp: 36.4 C (97.5 F)   Weight: 109.3 kg (241 lb)       Constitutional: Patient is oriented to person, place, and time; appears well-developed and well-nourished. No distress.  Very pleasant and cooperative, accompanied by her aunt.  HENT:   Head: Normocephalic and atraumatic.   Mouth/Throat: No oropharyngeal exudate.   Eyes: Conjunctivae and EOM are normal. Pupils are equal, round, and reactive to light. No scleral icterus.   Neck: Normal range of motion. Neck supple. No JVD present. No tracheal deviation present. No thyromegaly present.   Cardiovascular: Normal rate and  regular rhythm.   Pulmonary/Chest: Effort normal and breath sounds normal. No respiratory distress. There are no wheezes , no rales.   Abdominal: Soft. Bowel sounds are normal. Patient exhibits no distension and no mass. There is no tenderness. There is no rebound and no guarding. No hepatosplenomegaly appreciated.  Musculoskeletal: Normal range of motion. The patient exhibits no edema ; + lumbar area tenderness.   Neurological: Alert and oriented to person, place, and time. No cranial nerve deficit. Good muscle strength throughout.  Skin: Skin is warm and dry. No rash noted. Patient is not diaphoretic. No erythema. No pallor.   Psychiatric:  Normal mood and affect; behavior is normal. Thought content normal.       Results for Moss McSTONE, Destyn E (MRN [id number]) as of 05/10/2017 12:47   Ref. Range 04/20/2017 15:04   NT-pro BNP Latest Ref Range: 0 - 450 pg/mL 57   Sodium Latest Ref Range: 133 - 145 mmol/L 137   Potassium Latest Ref Range: 3.3 - 5.1 mmol/L 3.9   Chloride Latest Ref Range: 96 - 108 mmol/L 89 (L)   CO2 Latest Ref Range: 20 - 28 mmol/L 33 (H)   Anion Gap Latest Ref Range: 7 - 16  15   UN Latest Ref Range: 6 - 20 mg/dL 21 (H)   Creatinine Latest Ref Range: 0.51 - 0.95 mg/dL 8.290.74   GFR,Black Latest Units: * 111   GFR,Caucasian Latest Units: * 96   Glucose Latest Ref Range: 60 - 99 mg/dL 562120 (H)   Calcium Latest Ref Range: 8.6 - 10.2 mg/dL 9.7   Iron Latest Ref Range: 34 - 165 ug/dL 86   TIBC Latest Ref Range: 250 - 450 ug/dL 130394   Transferrin Saturation Latest Ref Range: 15 - 50 % 22   Ferritin Latest Ref Range: 10 - 120 ng/mL 130 (H)   WBC Latest Ref Range: 4.0 - 10.0 THOU/uL 11.4 (H)   RBC Latest Ref Range: 3.9 - 5.2 MIL/uL 5.3 (H)   Hemoglobin Latest Ref Range: 11.2 - 15.7 g/dL 86.515.5   Hematocrit Latest Ref Range: 34 - 45 % 49 (H)   MCV Latest Ref Range: 79 - 95 fL 91   MCH Latest Ref Range: 26 - 32 pg/cell 29   MCHC Latest Ref Range: 32 - 36 g/dL 32   RDW Latest Ref Range: 11.7 - 14.4 % 13.1   Platelets Latest  Ref Range: 160 - 370 THOU/uL 452 (H)     Assessment and Plan:  Raelyn NumberLaura Coulthard is  a 46 y/o woman with iron deficiency in setting of Pulmonary Venous Hypertension, obesity, obstructive Sleep Apnea presenting for 2 infusions of Feraheme.  First infusion was given in the infusion room today which she tolerated without side effects and second infusion will be given in 1 week.  Cherre Blanc, PA [facility] Infusion Room at [facility]

## 2017-05-10 NOTE — Progress Notes (Signed)
Christine Patton is here today for her first feraheme treatment, PIV has great blood return and is flushing well pre and post treatment.  Christine Patton tolerated her treatment well and VSS after 30 min wait.  Patient is going to call for a second appointment when she gets home, AVS given at discharge.

## 2017-05-15 ENCOUNTER — Other Ambulatory Visit: Payer: Self-pay | Admitting: Oncology

## 2017-05-15 ENCOUNTER — Telehealth: Payer: Self-pay | Admitting: Pulmonology

## 2017-05-15 NOTE — Telephone Encounter (Signed)
Nadine Counts from [employer] is requesting to speak with Jocelyn. He stated that this is regarding hotel reservations for the patient. Needs clarification on the dates needed for the patient    Please call the Nadine Counts back at [phone] to discuss.

## 2017-05-15 NOTE — Telephone Encounter (Signed)
Returned call to [employer]. Nadine Counts states he has a request 05/09/17. Clarified that this request is for an upcoming visit.

## 2017-05-17 ENCOUNTER — Ambulatory Visit: Payer: PRIVATE HEALTH INSURANCE

## 2017-05-23 ENCOUNTER — Encounter: Payer: Self-pay | Admitting: Pulmonology

## 2017-05-23 NOTE — Telephone Encounter (Signed)
Christine Patton is having increased Acid Reflux and chest pain.  Would like our help     She gets CPAP on OCt 2nd-reassured her importance  Iron is giving her great benefit goes for 2nd infusion tomorrow.

## 2017-05-24 ENCOUNTER — Ambulatory Visit: Payer: PRIVATE HEALTH INSURANCE

## 2017-05-24 VITALS — BP 126/84 | HR 86 | Temp 96.8°F | Wt 245.6 lb

## 2017-05-24 DIAGNOSIS — E611 Iron deficiency: Secondary | ICD-10-CM

## 2017-05-24 MED ORDER — SODIUM CHLORIDE 0.9 % IV SOLN WRAPPED *I*
510.0000 mg | Freq: Once | INTRAVENOUS | Status: AC
Start: 2017-05-24 — End: 2017-05-24
  Administered 2017-05-24: 510 mg via INTRAVENOUS
  Filled 2017-05-24
  Filled 2017-05-24: qty 17

## 2017-05-24 MED ORDER — SODIUM CHLORIDE 0.9 % IV SOLN WRAPPED *I*
30.0000 mL/h | Status: DC | PRN
Start: 2017-05-24 — End: 2017-05-24
  Administered 2017-05-24: 30 mL/h via INTRAVENOUS

## 2017-05-24 NOTE — Progress Notes (Signed)
M8U1 Patient tolerated infusion of feraheme via patent piv .  + blood return noted before and after. VSS before and after. Denies need for additional appt at this time. Discharged ambulatory.  AVS provided.  Enc to call PRN.

## 2017-05-27 ENCOUNTER — Encounter: Payer: Self-pay | Admitting: Pulmonology

## 2017-06-05 ENCOUNTER — Encounter: Payer: Self-pay | Admitting: Pulmonology

## 2017-06-05 NOTE — Telephone Encounter (Signed)
No need for oxygen with cpap.

## 2017-10-13 ENCOUNTER — Encounter: Payer: Self-pay | Admitting: Pulmonology

## 2017-10-13 DIAGNOSIS — I5030 Unspecified diastolic (congestive) heart failure: Secondary | ICD-10-CM

## 2017-10-13 DIAGNOSIS — G4733 Obstructive sleep apnea (adult) (pediatric): Secondary | ICD-10-CM

## 2017-10-13 NOTE — Telephone Encounter (Addendum)
Chart review shows that her tCO2 is quite elevated -- looks like she has hypercapnia which would certainly be a problem for her.    We should see a CPAP data download.  She should have updated labs CBC, BMP, BNP, iron studies; she should go to the main [facility] lab so that she can get an ABG as well (I ordered everything).    How has she done with exercise?  What's her weight?

## 2017-10-13 NOTE — Telephone Encounter (Signed)
Left message for [employer]- will senVernona Riegerd my chart with recommendations from Dr Cliffton AstersWhite.

## 2018-01-30 ENCOUNTER — Other Ambulatory Visit: Payer: Self-pay | Admitting: Pulmonology
